# Patient Record
Sex: Male | Born: 1960
Health system: Southern US, Community
[De-identification: ages and names within clinical notes are randomized; demographics above are authoritative.]

## PROBLEM LIST (undated history)

## (undated) DIAGNOSIS — N289 Disorder of kidney and ureter, unspecified: Secondary | ICD-10-CM

## (undated) DIAGNOSIS — I509 Heart failure, unspecified: Secondary | ICD-10-CM

---
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
## 2018-02-01 DIAGNOSIS — I824Y2 Acute embolism and thrombosis of unspecified deep veins of left proximal lower extremity: Secondary | ICD-10-CM | POA: Insufficient documentation

## 2018-02-01 DIAGNOSIS — I10 Essential (primary) hypertension: Secondary | ICD-10-CM | POA: Insufficient documentation

## 2018-04-11 ENCOUNTER — Telehealth: Payer: Self-pay | Admitting: *Deleted

## 2018-04-11 ENCOUNTER — Other Ambulatory Visit (HOSPITAL_COMMUNITY): Payer: Self-pay | Admitting: Diagnostic Radiology

## 2018-04-11 DIAGNOSIS — Z95828 Presence of other vascular implants and grafts: Secondary | ICD-10-CM

## 2018-04-11 DIAGNOSIS — I82402 Acute embolism and thrombosis of unspecified deep veins of left lower extremity: Secondary | ICD-10-CM

## 2018-04-11 NOTE — Telephone Encounter (Signed)
Left message for patient to call back to fu with Thrombolysis with Dr. Ronni Rumble

## 2018-06-20 ENCOUNTER — Encounter: Payer: Self-pay | Admitting: *Deleted

## 2018-08-03 DIAGNOSIS — R609 Edema, unspecified: Secondary | ICD-10-CM

## 2018-08-03 DIAGNOSIS — Z86718 Personal history of other venous thrombosis and embolism: Secondary | ICD-10-CM

## 2018-08-03 DIAGNOSIS — I10 Essential (primary) hypertension: Secondary | ICD-10-CM

## 2018-08-03 DIAGNOSIS — J90 Pleural effusion, not elsewhere classified: Secondary | ICD-10-CM

## 2018-08-03 DIAGNOSIS — N179 Acute kidney failure, unspecified: Secondary | ICD-10-CM

## 2018-08-03 DIAGNOSIS — I509 Heart failure, unspecified: Secondary | ICD-10-CM

## 2018-10-11 ENCOUNTER — Telehealth (HOSPITAL_COMMUNITY): Payer: Self-pay | Admitting: Surgery

## 2018-10-11 NOTE — Telephone Encounter (Signed)
I called Kurt Woodard in reference to a message that I received from Dr Nyra Capes that he needs referral to the [facility].  I have requested his records and attempted to call him to arrange an appt in the clinic.  I asked that he return the call.

## 2018-11-15 ENCOUNTER — Telehealth (HOSPITAL_COMMUNITY): Payer: Self-pay | Admitting: Cardiology

## 2018-11-15 NOTE — Telephone Encounter (Signed)
Called and left message for patient to call back to schedule New CHF appt.  Trying to see if pt is available on 11/17/18 @ 9 am for appt with Dr. Aundra Dubin

## 2018-11-20 NOTE — Telephone Encounter (Signed)
Patient has not called back to schedule New CHF appt.  Called and left 2nd message for pt to call back to schedule appt.  Per Darnelle Bos, if no new CHF appts ava on MD side, can consider scheduling pt on APP side of Clinic.

## 2018-11-24 NOTE — Telephone Encounter (Signed)
Spoke with patient's son, Christy Sartorius, [facility] on acct.  He verified the home number we have for patient.  He stated would try to contact the patient and ask him to give [place] a call to schedule an appt.

## 2018-12-15 ENCOUNTER — Telehealth (HOSPITAL_COMMUNITY): Payer: Self-pay | Admitting: Vascular Surgery

## 2018-12-15 NOTE — Telephone Encounter (Signed)
Left pt message giving detailed message about New pt appt  02/02/19 @ 11:20 , asked pt to call back to confirm appt

## 2019-02-02 ENCOUNTER — Inpatient Hospital Stay (HOSPITAL_COMMUNITY): Admission: RE | Admit: 2019-02-02 | Payer: Self-pay | Source: Ambulatory Visit | Admitting: Internal Medicine

## 2019-06-20 ENCOUNTER — Emergency Department (HOSPITAL_COMMUNITY): Payer: Self-pay

## 2019-06-20 ENCOUNTER — Other Ambulatory Visit: Payer: Self-pay

## 2019-06-20 ENCOUNTER — Encounter (HOSPITAL_COMMUNITY): Payer: Self-pay

## 2019-06-20 ENCOUNTER — Emergency Department (HOSPITAL_COMMUNITY)
Admission: EM | Admit: 2019-06-20 | Discharge: 2019-06-21 | Disposition: A | Discharge status: Home / Self Care | Payer: Self-pay | Attending: Emergency Medicine | Admitting: Emergency Medicine

## 2019-06-20 DIAGNOSIS — K8050 Calculus of bile duct without cholangitis or cholecystitis without obstruction: Secondary | ICD-10-CM

## 2019-06-20 DIAGNOSIS — K8020 Calculus of gallbladder without cholecystitis without obstruction: Secondary | ICD-10-CM | POA: Insufficient documentation

## 2019-06-20 DIAGNOSIS — I509 Heart failure, unspecified: Secondary | ICD-10-CM | POA: Insufficient documentation

## 2019-06-20 DIAGNOSIS — Z79899 Other long term (current) drug therapy: Secondary | ICD-10-CM | POA: Insufficient documentation

## 2019-06-20 DIAGNOSIS — R140 Abdominal distension (gaseous): Secondary | ICD-10-CM | POA: Insufficient documentation

## 2019-06-20 HISTORY — DX: Heart failure, unspecified: I50.9

## 2019-06-20 HISTORY — DX: Disorder of kidney and ureter, unspecified: N28.9

## 2019-06-20 LAB — COMPREHENSIVE METABOLIC PANEL
ALT: 23 U/L (ref 0–44)
AST: 25 U/L (ref 15–41)
Albumin: 3.6 g/dL (ref 3.5–5.0)
Alkaline Phosphatase: 92 U/L (ref 38–126)
Anion gap: 14 (ref 5–15)
BUN: 29 mg/dL — ABNORMAL HIGH (ref 6–20)
CO2: 27 mmol/L (ref 22–32)
Calcium: 9.5 mg/dL (ref 8.9–10.3)
Chloride: 99 mmol/L (ref 98–111)
Creatinine, Ser: 1.81 mg/dL — ABNORMAL HIGH (ref 0.61–1.24)
GFR calc Af Amer: 47 mL/min — ABNORMAL LOW (ref >60)
GFR calc non Af Amer: 41 mL/min — ABNORMAL LOW (ref >60)
Glucose, Bld: 156 mg/dL — ABNORMAL HIGH (ref 70–99)
Potassium: 4.3 mmol/L (ref 3.5–5.1)
Sodium: 140 mmol/L (ref 135–145)
Total Bilirubin: 2.6 mg/dL — ABNORMAL HIGH (ref 0.3–1.2)
Total Protein: 7.4 g/dL (ref 6.5–8.1)

## 2019-06-20 LAB — URINALYSIS, ROUTINE W REFLEX MICROSCOPIC
Bilirubin Urine: NEGATIVE
Glucose, UA: NEGATIVE mg/dL
Ketones, ur: NEGATIVE mg/dL
Nitrite: NEGATIVE
Protein, ur: 100 mg/dL — AB
Specific Gravity, Urine: 1.015 (ref 1.005–1.030)
pH: 5 (ref 5.0–8.0)

## 2019-06-20 LAB — CBC
HCT: 50.8 % (ref 39.0–52.0)
Hemoglobin: 15.8 g/dL (ref 13.0–17.0)
MCH: 27.9 pg (ref 26.0–34.0)
MCHC: 31.1 g/dL (ref 30.0–36.0)
MCV: 89.6 fL (ref 80.0–100.0)
Platelets: 176 10*3/uL (ref 150–400)
RBC: 5.67 MIL/uL (ref 4.22–5.81)
RDW: 15.7 % — ABNORMAL HIGH (ref 11.5–15.5)
WBC: 8.8 10*3/uL (ref 4.0–10.5)
nRBC: 0 % (ref 0.0–0.2)

## 2019-06-20 LAB — LIPASE, BLOOD: Lipase: 25 U/L (ref 11–51)

## 2019-06-20 MED ORDER — SODIUM CHLORIDE 0.9% FLUSH: 3.0000 mL | Freq: Once | INTRAVENOUS | Status: DC

## 2019-06-20 NOTE — ED Provider Notes (Signed)
[facility] EMERGENCY DEPARTMENT Provider Note   CSN: [id number] Arrival date & time: 06/20/19  1515    History   Chief Complaint Chief Complaint  Patient presents with   Abdominal Pain    HPI Kurt Woodard is a 58 y.o. male.     The history is provided by the patient and medical records.  Abdominal Pain Pain location:  Generalized Pain quality: bloating   Pain radiates to:  Does not radiate Pain severity:  Mild Onset quality:  Sudden Duration:  2 weeks Timing:  Intermittent Progression:  Waxing and waning Chronicity:  New Context: eating   Context: not medication withdrawal, not recent illness, not retching, not suspicious food intake and not trauma   Worsened by:  Eating Associated symptoms: no anorexia, no belching, no chest pain, no chills, no constipation, no cough, no diarrhea, no dysuria, no fatigue, no fever, no nausea, no shortness of breath, no sore throat and no vomiting     Past Medical History:  Diagnosis Date   CHF (congestive heart failure) (HCC)    Renal disorder    kidney stones    There are no active problems to display for this patient.   History reviewed. No pertinent surgical history.      Home Medications    Prior to Admission medications   Medication Sig Start Date End Date Taking? Authorizing Provider  furosemide (LASIX) 40 MG tablet Take 40 mg by mouth 2 (two) times daily. 06/08/19  Yes [provider]    Family History No family history on file.  Social History Social History   Tobacco Use   Smoking status: Not on file  Substance Use Topics   Alcohol use: Not on file   Drug use: Not on file     Allergies   Patient has no known allergies.   Review of Systems Review of Systems  Constitutional: Negative for activity change, appetite change, chills, fatigue and fever.  HENT: Negative for sore throat.   Respiratory: Negative for cough and shortness of breath.   Cardiovascular:  Positive for leg swelling. Negative for chest pain.  Gastrointestinal: Positive for abdominal distention and abdominal pain. Negative for anorexia, blood in stool, constipation, diarrhea, nausea and vomiting.  Genitourinary: Negative for decreased urine volume and dysuria.  Musculoskeletal: Negative for joint swelling, neck pain and neck stiffness.  Skin: Negative for color change, pallor and wound.  Neurological: Negative.   Psychiatric/Behavioral: Negative.      Physical Exam Updated Vital Signs BP (!) 140/99    Pulse 100    Temp 98.2 F (36.8 C) (Oral)    Resp 14    Ht 5\' 10"  (1.778 m)    Wt 92.1 kg    SpO2 94%    BMI 29.13 kg/m   Physical Exam Vitals signs and nursing note reviewed.  Constitutional:      Appearance: He is well-developed.  HENT:     Head: Normocephalic and atraumatic.  Eyes:     Extraocular Movements: Extraocular movements intact.     Conjunctiva/sclera: Conjunctivae normal.     Pupils: Pupils are equal, round, and reactive to light.  Neck:     Musculoskeletal: Neck supple.  Cardiovascular:     Rate and Rhythm: Regular rhythm. Tachycardia present.     Heart sounds: No murmur.  Pulmonary:     Effort: Pulmonary effort is normal. No respiratory distress.     Breath sounds: Normal breath sounds.  Abdominal:     General: Abdomen is  protuberant. Bowel sounds are normal.     Palpations: Abdomen is soft. There is no shifting dullness or fluid wave.     Tenderness: There is abdominal tenderness in the right upper quadrant.  Skin:    General: Skin is warm and dry.     Capillary Refill: Capillary refill takes less than 2 seconds.  Neurological:     General: No focal deficit present.     Mental Status: He is alert.      ED Treatments / Results  Labs (all labs ordered are listed, but only abnormal results are displayed) Labs Reviewed  COMPREHENSIVE METABOLIC PANEL - Abnormal; Notable for the following components:      Result Value   Glucose, Bld 156 (*)      BUN 29 (*)    Creatinine, Ser 1.81 (*)    Total Bilirubin 2.6 (*)    GFR calc non Af Amer 41 (*)    GFR calc Af Amer 47 (*)    All other components within normal limits  CBC - Abnormal; Notable for the following components:   RDW 15.7 (*)    All other components within normal limits  URINALYSIS, ROUTINE W REFLEX MICROSCOPIC - Abnormal; Notable for the following components:   Hgb urine dipstick SMALL (*)    Protein, ur 100 (*)    Leukocytes,Ua TRACE (*)    Bacteria, UA RARE (*)    All other components within normal limits  LIPASE, BLOOD    EKG None  Radiology Dg Chest 2 View  Result Date: 06/20/2019 CLINICAL DATA:  Pain EXAM: CHEST - 2 VIEW COMPARISON:  CT from 08/03/2018 FINDINGS: Cardiac shadow is mildly enlarged. Left lung is clear. Right-sided pleural effusion with underlying atelectasis/infiltrate is noted. It appears slightly improved when compared with the prior exam. No new focal abnormality is seen. IMPRESSION: Right-sided pleural effusion with underlying infiltrate/atelectasis. This is mildly improved when compared with the prior exam. Electronically Signed   By: Inez Catalina M.D.   On: 06/20/2019 23:06   US Abdomen Limited  Result Date: 06/20/2019 CLINICAL DATA:  Right upper quadrant pain. Point of care ultrasound in the ED with gallbladder wall thickening. EXAM: ULTRASOUND ABDOMEN LIMITED RIGHT UPPER QUADRANT COMPARISON:  None. FINDINGS: Gallbladder: Physiologically distended. Multiple small layering gallstones. Mild wall thickening at 4-5 mm. No sonographic Murphy sign noted by sonographer. Common bile duct: Diameter: 3 mm. Liver: No focal lesion identified. Within normal limits in parenchymal echogenicity. Portal vein is patent on color Doppler imaging with normal direction of blood flow towards the liver. Right upper quadrant ascites.  Right pleural effusion. IMPRESSION: 1. Small gallstones with mild gallbladder wall thickening. Wall thickening is nonspecific and may be  secondary to presence of ascites. Negative sonographic Murphy sign. 2. No biliary dilatation. 3. Right upper quadrant ascites.  Right pleural effusion. Electronically Signed   By: Keith Rake M.D.   On: 06/20/2019 23:21    Procedures Procedures (including critical care time)  Medications Ordered in ED Medications - No data to display   Initial Impression / Assessment and Plan / ED Course  I have reviewed the triage vital signs and the nursing notes.  Pertinent labs & imaging results that were available during my care of the patient were reviewed by me and considered in my medical decision making (see chart for details).  Differentials considered: Urinary tract infection, cryptitis, cystitis,   EM Physician interpretation of Labs:   CBC unremarkable  Metabolic panel significant for hype glycemia with glucose  156 elevated BUN and creatinine without prior for comparison and hyperbilirubinemia bilirubin 2.6  Lipase  Proteinuria and trace leukocytes on urine   Medical Decision Making:  Kurt Woodard is a 58 y.o. male with past medical history significant for recently diagnosed heart failure currently on Lasix who presents to the ED for further evaluation of a several week history of postprandial abdominal cramping-like pain that he describes more as a bloating feeling.  He denied any fever and was afebrile on arrival to the ED.  He is hemodynamically stable.  He had mild epigastric and right upper quadrant tenderness.  Point-of-care ultrasound by this physician at the bedside revealed mildly dilated/borderline thickening of the anterior wall of the gallbladder as well as gallstones within the lower body of the gallbladder.  He was sent for formal ultrasound study and this revealed the same findings as above consistent with cholelithiasis without acute cholecystitis.  I discussed these findings with patient at the bedside, he will be given a referral for outpatient general surgery and  was advised to return to the emergency department as needed with the development of any fever, intractable vomiting, or new, worsening or concerning symptoms.  Additionally, patient has not yet established with cardiology regarding his apparent HF diagnosis. We will attempt to send a message to the cardiology group to help facilitate an ED follow up visit.    Final Clinical Impressions(s) / ED Diagnoses   Final diagnoses:  Postprandial abdominal bloating  Calculus of gallbladder without cholecystitis without obstruction  Biliary colic   Disposition: Discharge   Key discharge instructions: Strict return precautions provided. Patient was encouraged to return to the ED should they experience worsening or persistence of current symptoms, or should they develop new concerning symptoms. Encouraged them to f/u with their PCP on an outpatient basis. Questions regarding the diagnosis were answered, and side effects regarding therapies were provided in writing or orally. Patient discharged in stable condition.    ED Discharge Orders         Ordered    Ambulatory referral to General Surgery    Comments: Patient lives in [place]. Referral to General Surgeon in proximity to patient's home   06/21/19 0015         The plan for this patient was discussed with my attending physician, Dr. Merrily Pew, who voiced agreement and who oversaw evaluation and treatment of this patient.   Kirsty Monjaraz A. Jimmye Norman, MD Resident Physician, PGY-3 Emergency Medicine [facility]    Jefm Petty, MD 06/21/19 4481    Merrily Pew, MD 06/23/19 [phone]

## 2019-06-20 NOTE — ED Triage Notes (Signed)
Pt reports generalized abd pain and bloating for the past week. Recently started on lasix. Denies any changes in BM. No n.v. Pt a.o, nad noted.

## 2019-06-21 NOTE — ED Notes (Signed)
Patient verbalized understanding of dc instructions, vss, ambulatory with nad.   

## 2019-07-25 ENCOUNTER — Other Ambulatory Visit: Payer: Self-pay

## 2019-07-25 ENCOUNTER — Ambulatory Visit (HOSPITAL_COMMUNITY)
Admission: RE | Admit: 2019-07-25 | Discharge: 2019-07-25 | Disposition: A | Discharge status: Home / Self Care | Payer: Self-pay | Source: Ambulatory Visit | Attending: Internal Medicine | Admitting: Internal Medicine

## 2019-07-25 VITALS — BP 136/96 | HR 105 | Wt 183.6 lb

## 2019-07-25 DIAGNOSIS — I5023 Acute on chronic systolic (congestive) heart failure: Secondary | ICD-10-CM | POA: Insufficient documentation

## 2019-07-25 DIAGNOSIS — Z86718 Personal history of other venous thrombosis and embolism: Secondary | ICD-10-CM | POA: Insufficient documentation

## 2019-07-25 DIAGNOSIS — N183 Chronic kidney disease, stage 3 (moderate): Secondary | ICD-10-CM | POA: Insufficient documentation

## 2019-07-25 DIAGNOSIS — Z79899 Other long term (current) drug therapy: Secondary | ICD-10-CM | POA: Insufficient documentation

## 2019-07-25 DIAGNOSIS — R000 Tachycardia, unspecified: Secondary | ICD-10-CM | POA: Insufficient documentation

## 2019-07-25 DIAGNOSIS — I5022 Chronic systolic (congestive) heart failure: Secondary | ICD-10-CM

## 2019-07-25 DIAGNOSIS — I130 Hypertensive heart and chronic kidney disease with heart failure and stage 1 through stage 4 chronic kidney disease, or unspecified chronic kidney disease: Secondary | ICD-10-CM | POA: Insufficient documentation

## 2019-07-25 DIAGNOSIS — Z87442 Personal history of urinary calculi: Secondary | ICD-10-CM | POA: Insufficient documentation

## 2019-07-25 DIAGNOSIS — I10 Essential (primary) hypertension: Secondary | ICD-10-CM

## 2019-07-25 MED ORDER — FUROSEMIDE 40 MG PO TABS: 40.0000 mg | ORAL_TABLET | Freq: Two times a day (BID) | ORAL | 3 refills | Status: DC

## 2019-07-25 MED ORDER — DIGOXIN 125 MCG PO TABS: 125.0000 ug | ORAL_TABLET | Freq: Every day | ORAL | 11 refills | Status: DC

## 2019-07-25 MED ORDER — SACUBITRIL-VALSARTAN 49-51 MG PO TABS: 1.0000 | ORAL_TABLET | Freq: Two times a day (BID) | ORAL | Status: DC

## 2019-07-25 MED ORDER — ENTRESTO 49-51 MG PO TABS: 1.0000 | ORAL_TABLET | Freq: Two times a day (BID) | ORAL | 6 refills | Status: DC

## 2019-07-25 NOTE — Progress Notes (Signed)
ADVANCED HF CLINIC CONSULT NOTE  Referring Physician: Dr. Maryella Woodard Primary Care: Dr. Maryella Woodard Primary Cardiologist: New  HPI:  [name] is a 58 y/o male with a h/o HTN, CKD, DVT and CHF referred by Dr. Maryella Woodard for further evaluation of HF.   No records available for review.   Has a h/o HTN but was well until 2 years ago. In Nov 2018 developed acute SOB. Says he was in bed for 1 month. Went to Urgent Care and diagnosed with PNA. Subsequently found to have LE DVT. He then developed fluid overload and had echo in 2019 and was told heart was operating at 20%. Underwent diuresis and felt better.   Did not have cath or repeat u/s. Says he got scared and decided not to f/u.   Says he feels good now. Back to coaching baseball. No CP, SOB, edema, orthopnea. Not taking any BP meds. Was started on Entresto but lost his insurance and no longer taking. Denies snoring.   Labs 06/20/19  K 4.3  Cr 1.8  Hgb 15.8 Urine + protein   Review of Systems: [y] = yes, [ ]  = no   General: Weight gain [ ] ; Weight loss [ ] ; Anorexia [ ] ; Fatigue [ ] ; Fever [ ] ; Chills [ ] ; Weakness [ ]   Cardiac: Chest pain/pressure [ ] ; Resting SOB [ ] ; Exertional SOB [ ] ; Orthopnea [ ] ; Pedal Edema [ ] ; Palpitations [ ] ; Syncope [ ] ; Presyncope [ ] ; Paroxysmal nocturnal dyspnea[ ]   Pulmonary: Cough [ ] ; Wheezing[ ] ; Hemoptysis[ ] ; Sputum [ ] ; Snoring [ ]   GI: Vomiting[ ] ; Dysphagia[ ] ; Melena[ ] ; Hematochezia [ ] ; Heartburn[ ] ; Abdominal pain [ ] ; Constipation [ ] ; Diarrhea [ ] ; BRBPR [ ]   GU: Hematuria[ ] ; Dysuria [ ] ; Nocturia[ ]   Vascular: Pain in legs with walking [ ] ; Pain in feet with lying flat [ ] ; Non-healing sores [ ] ; Stroke [ ] ; TIA [ ] ; Slurred speech [ ] ;  Neuro: Headaches[ ] ; Vertigo[ ] ; Seizures[ ] ; Paresthesias[ ] ;Blurred vision [ ] ; Diplopia [ ] ; Vision changes [ ]   Ortho/Skin: Arthritis Blue.Reese ]; Joint pain [ y]; Muscle pain [ ] ; Joint swelling [ ] ; Back Pain [ ] ; Rash [ ]   Psych:  Depression[ ] ; Anxiety[ ]   Heme: Bleeding problems [ ] ; Clotting disorders [ ] ; Anemia [ ]   Endocrine: Diabetes [ ] ; Thyroid dysfunction[ ]    Past Medical History:  Diagnosis Date  . CHF (congestive heart failure) ([place])   . Renal disorder    kidney stones  - HTN - CKD 3  Current Outpatient Medications  Medication Sig Dispense Refill  . furosemide (LASIX) 40 MG tablet Take 40 mg by mouth 2 (two) times daily.     No current facility-administered medications for this encounter.     No Known Allergies    Social History   Socioeconomic History  . Marital status: Single    Spouse name: Not on file  . Number of children: Not on file  . Years of education: Not on file  . Highest education level: Not on file  Occupational History  . Not on file  Social Needs  . Financial resource strain: Not on file  . Food insecurity    Worry: Not on file    Inability: Not on file  . Transportation needs    Medical: Not on file    Non-medical: Not on file  Tobacco Use  . Smoking status: Not on file  Substance  and Sexual Activity  . Alcohol use: Not on file  . Drug use: Not on file  . Sexual activity: Not on file  Lifestyle  . Physical activity    Days per week: Not on file    Minutes per session: Not on file  . Stress: Not on file  Relationships  . Social Herbalist on phone: Not on file    Gets together: Not on file    Attends religious service: Not on file    Active member of club or organization: Not on file    Attends meetings of clubs or organizations: Not on file    Relationship status: Not on file  . Intimate partner violence    Fear of current or ex partner: Not on file    Emotionally abused: Not on file    Physically abused: Not on file    Forced sexual activity: Not on file  Other Topics Concern  . Not on file  Social History Narrative  . Not on file    FHx: Denies Fhx of HF or premature CAD Personally reviewed   Vitals:   07/25/19 1107  BP: (!)  136/96  Pulse: (!) 105  SpO2: 95%  Weight: 83.3 kg (183 lb 9.6 oz)    PHYSICAL EXAM: General:  Well appearing. No respiratory difficulty HEENT: normal Neck: supple. JVP 10 with prominent CV waves. Carotids 2+ bilat; no bruits. No lymphadenopathy or thryomegaly appreciated. Cor: PMI laterally displaced. Tachy regular +s3 Lungs: clear Abdomen: soft, nontender, nondistended. No hepatosplenomegaly. No bruits or masses. Good bowel sounds. Extremities: no cyanosis, clubbing, rash, 2+ edema + varicose veins White nails (no clubbing)  Neuro: alert & oriented x 3, cranial nerves grossly intact. moves all 4 extremities w/o difficulty. Affect pleasant.  ECG: Sinus tach 118 IVCD nonspecific T wave flattening. Personally reviewed   ASSESSMENT & PLAN: 1. Acute on chronic systolic HF  - Suspect HTN CM but also at risk for iCM - I did bedside echo in Clinic EF 20% markedly dilated. RV moderately down  - Reports NYHA II symptoms but is very tachycardic with severe LV dysfunction - Has mild LE edema but ReDS 35% - Will start Entresto 49/51 bid and digoxin 0.125 daily - Continue lasix 40 daily.  - Will have him see HF PharmD in 2 weeks and the me again in 4 weeks - Ideally would have R/L cath soon but he is concerned about lack of insurance. I had him meet with HF SW today to work on this.  - Call immediately if getting worse  2. HTN, uncontrolled - starting Entresto at above  3. H/o DVT - likely provoked in setting of immobility when he first had HF - currently off AC  4. 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 Over half that time spent discussing above.    Glori Bickers, MD  12:55 PM

## 2019-07-25 NOTE — Patient Instructions (Addendum)
Start Digoxin 0.125mg  ( 1 tablet ) Daily.  Start Entresto 49/51mg  ( 1 tablet ) twice a day.  Come back in 2 weeks to see the Pharmacist.  Your physician would like to see you in 1 month and he would like you to have an Echocardiogram the same day.  At the [facility], you and your health needs are our priority. As part of our continuing mission to provide you with exceptional heart care, we have created designated Provider Care Teams. These Care Teams include your primary Cardiologist (physician) and Advanced Practice Providers (APPs- Physician Assistants and Nurse Practitioners) who all work together to provide you with the care you need, when you need it.   You may see any of the following providers on your designated Care Team at your next follow up: Marland Kitchen Dr Glori Bickers . Dr Loralie Champagne . Darrick Grinder, NP   Please be sure to bring in all your medications bottles to every appointment.

## 2019-07-25 NOTE — Progress Notes (Signed)
CSW consulted to assist with pt medication cost and insurance concerns.  Pt is going to be started on Entresto which is very expensive and pt is currently uninsured.  CSW assisted pt in filling out patient portion of [employer] application and then gave to pharm tech/patient advocate to complete and turn in for review.   CSW and pt then talked about current lack of insurance.  Pt reports he has been unable to work since 2018 due to medical concerns but kept hoping that he would be able to return to work so he never applied for disability.  CSW discussed pt current medical status and he acknowledges that he likely go back to work like he used to for a long period of time and is agreeable to applying for disability.  CSW provided pt with number for local office and instructed him to call and set up telephone interview to begin application.  CSW also discussed Medicaid with patient.  Pt hasn't worked for 2 years and has not had a source of income- states he has no assets and has less than $10,000 in savings.  Pt states he made a good living when he was working so there is a chance he might make too much in disability benefits if he is approved to qualify for Medicaid. CSW provided pt with paper Medicaid application as patient does not have access to the internet at home.  CSW also provided pt with CAFA to apply for cone assistance- CSW helped pt fill out 4506 nonfiling form and submitted to IRS.  Pt to complete application and turn in for review when finished.  CSW will continue to follow and assist as needed  Kurt Woodard, [facility] Desk#: [phone] Cell#: [phone]

## 2019-07-26 ENCOUNTER — Telehealth (HOSPITAL_COMMUNITY): Payer: Self-pay | Admitting: Pharmacy Technician

## 2019-07-26 NOTE — Telephone Encounter (Signed)
Patient was in clinic and given an application for [employer] Patient [place] (NPAF) in an effort to reduce the patient's out of pocket expense for Entresto to $0.    Application completed and faxed to [phone].   NPAF phone number for follow up is [phone].   This encounter will be updated until final determination.   Charlann Boxer, CPhT

## 2019-08-01 NOTE — Telephone Encounter (Signed)
Received notification that patient was approved to receive Entresto 49-51mg  through [employer].  ID: [id number] Effective Dates: 07/27/2019 through 07/26/2020  Phone Number: [phone]  Called patient to inform him of approval and to give him [employer] phone number in case they have not reached the patient. Was not able to leave voicemail, as it is full.   Will follow up.  Charlann Boxer, CPhT

## 2019-08-03 ENCOUNTER — Telehealth (HOSPITAL_COMMUNITY): Payer: Self-pay | Admitting: Pharmacist

## 2019-08-03 NOTE — Telephone Encounter (Signed)
Have attempted to cotnact PT several times (8/20 & 8/21) and PT's voicemail is full.  PT's pharmacy appt on 8/24 needs to be rescheduled for the week of 8/31 or first available.  -GSM, [facility]

## 2019-08-06 ENCOUNTER — Inpatient Hospital Stay (HOSPITAL_COMMUNITY): Admission: RE | Admit: 2019-08-06 | Payer: Self-pay | Source: Ambulatory Visit

## 2019-08-06 NOTE — Telephone Encounter (Signed)
Called patient to inquire if he was able to speak with [employer] about setting up shipment for his Entresto. Was not successful in reaching patient. I was able to leave voicemail this time.  Charlann Boxer, CPhT

## 2019-08-07 NOTE — Telephone Encounter (Signed)
Called patient again, not able to leave message. Voicemail is full.  Charlann Boxer, CPhT

## 2019-08-09 ENCOUNTER — Telehealth (HOSPITAL_COMMUNITY): Payer: Self-pay | Admitting: Licensed Clinical Social Worker

## 2019-08-09 NOTE — Telephone Encounter (Signed)
CSW called pt to check in regarding progress with disability, medicaid, and CAFA applications.  CSW unable to leave VM but texted number to request call back.  CSW will continue to follow and assist as needed  Jorge Ny, [facility] Desk#: [phone] Cell#: [phone]

## 2019-08-16 ENCOUNTER — Inpatient Hospital Stay (HOSPITAL_COMMUNITY): Admission: RE | Admit: 2019-08-16 | Payer: Self-pay | Source: Ambulatory Visit

## 2019-08-17 NOTE — Telephone Encounter (Signed)
Lauren spoke with patient, he is aware he needs to call [employer].  Charlann Boxer, CPhT

## 2019-08-27 ENCOUNTER — Other Ambulatory Visit: Payer: Self-pay

## 2019-08-27 ENCOUNTER — Ambulatory Visit (HOSPITAL_COMMUNITY)
Admission: RE | Admit: 2019-08-27 | Discharge: 2019-08-27 | Disposition: A | Discharge status: Home / Self Care | Payer: Self-pay | Source: Ambulatory Visit | Attending: Internal Medicine | Admitting: Internal Medicine

## 2019-08-27 ENCOUNTER — Encounter (HOSPITAL_COMMUNITY): Payer: Self-pay

## 2019-08-27 VITALS — BP 138/98 | HR 107 | Wt 195.0 lb

## 2019-08-27 DIAGNOSIS — I509 Heart failure, unspecified: Secondary | ICD-10-CM

## 2019-08-27 DIAGNOSIS — Z79899 Other long term (current) drug therapy: Secondary | ICD-10-CM | POA: Insufficient documentation

## 2019-08-27 DIAGNOSIS — I5022 Chronic systolic (congestive) heart failure: Secondary | ICD-10-CM

## 2019-08-27 DIAGNOSIS — Z86718 Personal history of other venous thrombosis and embolism: Secondary | ICD-10-CM | POA: Insufficient documentation

## 2019-08-27 DIAGNOSIS — I130 Hypertensive heart and chronic kidney disease with heart failure and stage 1 through stage 4 chronic kidney disease, or unspecified chronic kidney disease: Secondary | ICD-10-CM | POA: Insufficient documentation

## 2019-08-27 DIAGNOSIS — N183 Chronic kidney disease, stage 3 (moderate): Secondary | ICD-10-CM | POA: Insufficient documentation

## 2019-08-27 DIAGNOSIS — I5023 Acute on chronic systolic (congestive) heart failure: Secondary | ICD-10-CM | POA: Insufficient documentation

## 2019-08-27 HISTORY — DX: Heart failure, unspecified: I50.9

## 2019-08-27 LAB — BASIC METABOLIC PANEL
Anion gap: 10 (ref 5–15)
BUN: 22 mg/dL — ABNORMAL HIGH (ref 6–20)
CO2: 21 mmol/L — ABNORMAL LOW (ref 22–32)
Calcium: 9.4 mg/dL (ref 8.9–10.3)
Chloride: 106 mmol/L (ref 98–111)
Creatinine, Ser: 1.18 mg/dL (ref 0.61–1.24)
GFR calc Af Amer: >60 mL/min (ref >60)
GFR calc non Af Amer: >60 mL/min (ref >60)
Glucose, Bld: 143 mg/dL — ABNORMAL HIGH (ref 70–99)
Potassium: 4.5 mmol/L (ref 3.5–5.1)
Sodium: 137 mmol/L (ref 135–145)

## 2019-08-27 LAB — DIGOXIN LEVEL: Digoxin Level: 0.3 ng/mL — ABNORMAL LOW (ref 0.8–2.0)

## 2019-08-27 MED ORDER — DIGOXIN 125 MCG PO TABS: 125.0000 ug | ORAL_TABLET | Freq: Every day | ORAL | 11 refills | Status: DC

## 2019-08-27 MED ORDER — FUROSEMIDE 40 MG PO TABS: 40.0000 mg | ORAL_TABLET | Freq: Two times a day (BID) | ORAL | 3 refills | Status: DC

## 2019-08-27 MED ORDER — CARVEDILOL 3.125 MG PO TABS: 3.1250 mg | ORAL_TABLET | Freq: Two times a day (BID) | ORAL | 6 refills | Status: DC

## 2019-08-27 MED FILL — FUROSEMIDE 40 MG TAB: 40 | 30 days supply | Qty: 60 | Fill #0

## 2019-08-27 MED FILL — CARVEDILOL 3.125 MG TABLET: 3.125 | 34 days supply | Qty: 68 | Fill #0

## 2019-08-27 MED FILL — DIGOXIN 0.125 MG TABLET: 125 | 34 days supply | Qty: 34 | Fill #0

## 2019-08-27 NOTE — Patient Instructions (Addendum)
It was a pleasure seeing you today!  MEDICATIONS: -We are changing your medications today -Start taking carvedilol 3.125 mg twice daily.  - [employer] so they can ship you the [facility]. -Call if you have questions about your medications.  LABS: -We will call you if your labs need attention.  NEXT APPOINTMENT: Return to clinic in 2 weeks with Pharmacy clinic.  In general, to take care of your heart failure: -Limit your fluid intake to 2 Liters (half-gallon) per day.   -Limit your salt intake to ideally 2-3 grams (2000-3000 mg) per day. -Weigh yourself daily and record, and bring that "weight diary" to your next appointment.  (Weight gain of 2-3 pounds in 1 day typically means fluid weight.) -The medications for your heart are to help your heart and help you live longer.   -Please contact us before stopping any of your heart medications.  Call the clinic at [phone] with questions or to reschedule future appointments.

## 2019-08-29 NOTE — Progress Notes (Signed)
Referring Physician: Dr. Maryella Shivers Primary Care: Dr. Maryella Shivers Primary Cardiologist: Dr. Glori Bickers  HPI:  Kurt Woodard is a 58 y/o male with a h/o HTN, CKD, DVT and CHF referred by Dr. Maryella Shivers for further evaluation of HF.    No records available for review.    Has a h/o HTN but was well until 2 years ago. In Nov 2018 developed acute SOB. Says he was in bed for 1 month. Went to Urgent Care and diagnosed with PNA. Subsequently found to have LE DVT. He then developed fluid overload and had echo in 2019 and was told heart was operating at 20%. Underwent diuresis and felt better.    Did not have cath or repeat u/s. Says he got scared and decided not to f/u.    Seen in HF clinic on 07/25/2019. Stated he feels good now, is back to coaching baseball. No CP, SOB, edema, orthopnea. Not taking any BP meds. Was started on Entresto 49/51 mg but lost his insurance and no longer taking. Denies snoring. Bedside echo showed EF 20% (07/25/2019). ReDS reading 35%.   Today he returns to HF clinic for pharmacist medication titration. At last visit with MD, Delene Loll 49/51 mg twice daily and digoxin 0.125 mg daily were initiated. He has tolerated those changes very well. No lightheadedness, dizziness, chest pain or palpitations. His exercise tolerance has greatly improved since starting Entresto. He is able to work outside on his yard and his neighbor's yard without stopping, whereas he used to only be able to work on half of his own yard. 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 He does not weigh himself daily. His weight is up today in clinic, but this is because he is eating more because he "feels so much better on the Entresto". He takes furosemide 40 mg twice daily and has not required any extra. No lower extremity edema, PND or orthopnea. He is very anxious about adding any new medications because he believes his father died from polypharmacy (he  stated a physician told him that medications were the cause of the cancer).     . Shortness of breath/dyspnea on exertion? no  . Orthopnea/PND? no . Edema? no . Lightheadedness/dizziness? no . Daily weights at home? no . Blood pressure/heart rate monitoring at home? no . Following low-sodium/fluid-restricted diet? yes  HF Medications: Entresto 49/51 mg twice daily Digoxin 0.125 mg daily  Furosemide 40 mg twice daily  Has the patient been experiencing any side effects to the medications prescribed?  no  Does the patient have any problems obtaining medications due to transportation or finances?   Yes - does not have prescription insurance. I sent his prescriptions to [facility] outpatient pharmacy today to be filled through the HF fund. He has been approved for [employer] for [place], but he needs to call [employer] to set up the shipment.   Understanding of regimen: fair Understanding of indications: poor Potential of compliance: poor Patient understands to avoid NSAIDs. Patient understands to avoid decongestants.    Pertinent Lab Values 08/27/19: . Serum creatinine 1.18, BUN 22, Potassium 4.5, Sodium 137, Digoxin 0.3 ng/mL   Vital Signs: . Weight: 195 (last clinic weight: 183.6) . Blood pressure: 138/98  . Heart rate: 107   Assessment: 1. Acute on chronic systolic HF  - Suspected HTN CM but also at risk for iCM - Bedside echo in Clinic on 07/25/2019 showed EF 20%, markedly dilated. RV moderately down.  - NYHA  II symptoms. Euvolemic on exam.  - BP stable at 138/98. Tachycardic HR 107.  - Labs reviewed in clinic were within normal limits: Scr improved from 1.81 to 1.18 and K stable at 4.5.  - Start carvedilol 3.125 mg BID. - Continue Entresto 49/51 mg BID  - Continue Digoxin 0.125 mg daily. Level today 0.3 ng/mL.  - Continue Furosemide 40 twice daily.  - He is extremely anxious about being on multiple medications at one time for his heart failure. I explained  indication and risk/benefit of all medications and gave him the Living with Heart Failure packet. He would benefit from keeping his medication regimen as simple as possible. Has self-discontinued medications in the past.  -Has no prescription insurance. Used HF fund for all medications except Entresto. Provided 1 month of samples for Entresto. Approved for Manufacturer[employer] Assistance but he was unaware he needed to call [employer] for delivery.  -Return to pharmacy clinic in 2 weeks. Echo in 4 weeks.  - Ideally would have R/L cath soon but he is concerned about lack of insurance. Meeting with social work about this.    2. HTN, uncontrolled - Start carvedilol 3.125 mg BID and continue Entresto 49/51 mg BID as above.   3. H/o DVT - Likely provoked in setting of immobility when he first had HF - Currently off AC   4. 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  2) Labs: Scr improving, K stable 3) Follow-up: 2 weeks with pharmacy clinic. 4 weeks with Dr. Haroldine Laws + echo.   Audry Riles, PharmD, BCPS Heart Failure Clinic Pharmacist [phone]   Patient seen, assessed and discussed with Dr. Lynelle Smoke.  Agree with assessment -  If patient seemed more tenuous and less active would have considered increasing Entresto for increased afterload reduction in setting of ST vs adding coreg, but states feels good and is actively coaching.  Agree with plan to initiate low dose coreg and increase Entresto in near future.  Bonnita Nasuti Pharm.D. CPP, BCPS Clinical Pharmacist [phone] 08/29/2019 1:04 PM

## 2019-09-12 ENCOUNTER — Inpatient Hospital Stay (HOSPITAL_COMMUNITY)
Admission: RE | Admit: 2019-09-12 | Discharge: 2019-09-12 | Disposition: A | Discharge status: Home / Self Care | Payer: Self-pay | Source: Ambulatory Visit

## 2019-09-26 ENCOUNTER — Ambulatory Visit (HOSPITAL_COMMUNITY): Admission: RE | Admit: 2019-09-26 | Payer: Self-pay | Source: Ambulatory Visit

## 2019-09-26 ENCOUNTER — Inpatient Hospital Stay (HOSPITAL_COMMUNITY): Admission: RE | Admit: 2019-09-26 | Payer: Self-pay | Source: Ambulatory Visit | Admitting: Internal Medicine

## 2020-01-21 MED FILL — DIGOXIN 0.125 MG TABLET: 125 | 34 days supply | Qty: 34 | Fill #1

## 2020-01-21 MED FILL — FUROSEMIDE 40 MG TAB: 40 | 30 days supply | Qty: 60 | Fill #1

## 2020-06-24 MED FILL — DIGOXIN 0.125 MG TABLET: 125 | 34 days supply | Qty: 34 | Fill #2

## 2020-06-24 MED FILL — FUROSEMIDE 40 MG TAB: 40 | 30 days supply | Qty: 60 | Fill #2

## 2020-07-18 ENCOUNTER — Telehealth (HOSPITAL_COMMUNITY): Payer: Self-pay | Admitting: Pharmacy Technician

## 2020-07-18 NOTE — Telephone Encounter (Signed)
It's time to re-enroll patient to receive medication assistance for Entresto from [employer]. Patient has no insurance at this time. Attempted to call patient, no way to leave a message.  Will follow up.

## 2020-07-21 ENCOUNTER — Other Ambulatory Visit (HOSPITAL_COMMUNITY): Payer: Self-pay

## 2020-07-21 MED ORDER — ENTRESTO 49-51 MG PO TABS: 1.0000 | ORAL_TABLET | Freq: Two times a day (BID) | ORAL | 0 refills | Status: DC

## 2020-08-14 NOTE — Telephone Encounter (Signed)
Attempted to call the patient again. No way to leave a message. Will be here to assist in the future as needed.  Charlann Boxer, CPhT

## 2020-09-26 ENCOUNTER — Other Ambulatory Visit (HOSPITAL_COMMUNITY): Payer: Self-pay | Admitting: Internal Medicine

## 2020-09-30 ENCOUNTER — Other Ambulatory Visit (HOSPITAL_COMMUNITY): Payer: Self-pay | Admitting: Internal Medicine

## 2020-09-30 ENCOUNTER — Other Ambulatory Visit (HOSPITAL_COMMUNITY): Payer: Self-pay

## 2020-09-30 MED ORDER — DIGOXIN 125 MCG PO TABS: 125.0000 ug | ORAL_TABLET | Freq: Every day | ORAL | 2 refills | Status: DC

## 2020-09-30 MED ORDER — CARVEDILOL 3.125 MG PO TABS: 3.1250 mg | ORAL_TABLET | Freq: Two times a day (BID) | ORAL | 3 refills | Status: DC

## 2020-09-30 MED ORDER — FUROSEMIDE 40 MG PO TABS: 40.0000 mg | ORAL_TABLET | Freq: Two times a day (BID) | ORAL | 2 refills | Status: DC

## 2020-09-30 MED FILL — FUROSEMIDE 40 MG TAB: 40 | 30 days supply | Qty: 60 | Fill #0

## 2020-09-30 MED FILL — DIGOXIN 0.125 MG TABLET: 125 | 34 days supply | Qty: 34 | Fill #0

## 2020-09-30 MED FILL — CARVEDILOL 3.125 MG TABLET: 3.125 | 30 days supply | Qty: 60 | Fill #0

## 2020-12-04 MED FILL — FUROSEMIDE 40 MG TAB: 40 | 30 days supply | Qty: 60 | Fill #1

## 2020-12-04 MED FILL — CARVEDILOL 3.125 MG TABLET: 3.125 | 30 days supply | Qty: 60 | Fill #1

## 2020-12-04 MED FILL — DIGOXIN 0.125 MG TABLET: 125 | 34 days supply | Qty: 34 | Fill #1

## 2021-02-05 MED FILL — DIGOXIN 0.125 MG TABLET: 125 | 34 days supply | Qty: 34 | Fill #2

## 2021-02-05 MED FILL — CARVEDILOL 3.125 MG TABLET: 3.125 | 30 days supply | Qty: 60 | Fill #2

## 2021-02-05 MED FILL — FUROSEMIDE 40 MG TAB: 40 | 30 days supply | Qty: 60 | Fill #2

## 2021-04-20 ENCOUNTER — Telehealth (HOSPITAL_COMMUNITY): Payer: Self-pay | Admitting: Licensed Clinical Social Worker

## 2021-04-20 NOTE — Telephone Encounter (Signed)
CSW received call from pt requesting help getting re-established with [employer] assistance.  Per notes his enrollment expired at the end of 2021 but Patient Advocate had been unable to get a hold of him to discuss re- enrollment.  Pt states he was out of town taking care of a family member but does have the [employer] re- enrollment form now that he is back home.  CSW informed him he needs to complete the patient assistance portion and then bring to the clinic.  See that pt has not been to clinic in about 1.5 years so told him he also needs to call and schedule an appt to make sure we still have him on the appropriate dose of Entresto- pt expressed understanding and will call to get appt scheduled.  Will continue to follow and assist as needed  Jorge Ny, [facility] Desk#: [phone] Cell#: [phone]

## 2021-04-23 ENCOUNTER — Other Ambulatory Visit (HOSPITAL_COMMUNITY): Payer: Self-pay

## 2021-04-23 ENCOUNTER — Other Ambulatory Visit (HOSPITAL_COMMUNITY): Payer: Self-pay | Admitting: Internal Medicine

## 2021-04-23 MED FILL — Carvedilol Tab 3.125 MG: ORAL | 30 days supply | Qty: 60 | Fill #0 | Status: CN

## 2021-04-24 ENCOUNTER — Other Ambulatory Visit (HOSPITAL_COMMUNITY): Payer: Self-pay

## 2021-04-24 ENCOUNTER — Other Ambulatory Visit (HOSPITAL_COMMUNITY): Payer: Self-pay | Admitting: Internal Medicine

## 2021-04-24 ENCOUNTER — Other Ambulatory Visit (HOSPITAL_COMMUNITY): Payer: Self-pay | Admitting: *Deleted

## 2021-04-24 DIAGNOSIS — I5022 Chronic systolic (congestive) heart failure: Secondary | ICD-10-CM

## 2021-04-24 MED ORDER — ENTRESTO 49-51 MG PO TABS: 1.0000 | ORAL_TABLET | Freq: Two times a day (BID) | ORAL | 3 refills | Status: DC

## 2021-04-24 MED ORDER — FUROSEMIDE 40 MG PO TABS
40.0000 mg | ORAL_TABLET | Freq: Two times a day (BID) | ORAL | 3 refills | Status: DC
  Filled 2021-04-24: qty 120, 60d supply, fill #0

## 2021-04-24 MED ORDER — DIGOXIN 125 MCG PO TABS
0.1250 mg | ORAL_TABLET | Freq: Every day | ORAL | 3 refills | Status: DC
  Filled 2021-04-24: qty 30, 30d supply, fill #0

## 2021-04-24 MED FILL — Carvedilol Tab 3.125 MG: ORAL | 30 days supply | Qty: 60 | Fill #0 | Status: CN

## 2021-04-30 ENCOUNTER — Encounter (HOSPITAL_COMMUNITY): Payer: Self-pay

## 2021-04-30 ENCOUNTER — Ambulatory Visit (HOSPITAL_COMMUNITY)
Admission: EM | Admit: 2021-04-30 | Discharge: 2021-04-30 | Disposition: A | Discharge status: Home / Self Care | Payer: Self-pay | Attending: Internal Medicine | Admitting: Internal Medicine

## 2021-04-30 ENCOUNTER — Other Ambulatory Visit: Payer: Self-pay

## 2021-04-30 DIAGNOSIS — R0989 Other specified symptoms and signs involving the circulatory and respiratory systems: Secondary | ICD-10-CM

## 2021-04-30 MED ORDER — GUAIFENESIN ER 600 MG PO TB12: 600.0000 mg | ORAL_TABLET | Freq: Two times a day (BID) | ORAL | 0 refills | Status: DC

## 2021-04-30 NOTE — ED Triage Notes (Signed)
Pt in with c/o chest congestion, nausea, and diarrhea. Pt states the diarrhea is improving  Pt has been taking otc meds with no relief

## 2021-04-30 NOTE — Discharge Instructions (Addendum)
Medications as prescribed Increase oral fluid intake If diarrhea does not subside completely please return to urgent care to be reevaluated.  At this time I suspect that the stomach "bug" is responsible for your diarrhea.  This is likely viral If you have any worsening symptoms please return to the urgent care to be reevaluated.

## 2021-05-02 NOTE — ED Provider Notes (Signed)
[facility]    CSN: [id number] Arrival date & time: 04/30/21  1005      History   Chief Complaint Chief Complaint  Patient presents with  . chest congestion  . Nausea    HPI Kurt Woodard is a 60 y.o. male comes to the urgent care with complaints of cough, nausea and diarrhea for few days duration.  The diarrhea is improving.  Patient has tried over-the-counter medications with no improvement in symptoms.  No recent travel.  Patient continues to have cough with gradual use.  If she is able to bring up some sputum is clear but very thick.  No shortness of breath or wheezing.  No fever or chills.  No sick contacts.Marland Kitchen   HPI  Past Medical History:  Diagnosis Date  . CHF (congestive heart failure) ([place])   . Chronic congestive heart failure ([place]) 08/27/2019  . Renal disorder    kidney stones    Patient Active Problem List   Diagnosis Date Noted  . Chronic congestive heart failure ([place]) 08/27/2019    History reviewed. No pertinent surgical history.     Home Medications    Prior to Admission medications   Medication Sig Start Date End Date Taking? Authorizing Provider  guaiFENesin (MUCINEX) 600 MG 12 hr tablet Take 1 tablet (600 mg total) by mouth 2 (two) times daily for 10 days. 04/30/21 05/10/21 Yes Christepher Melchior, Myrene Galas, MD  carvedilol (COREG) 3.125 MG tablet TAKE 1 TABLET (3.125 MG TOTAL) BY MOUTH 2 (TWO) TIMES DAILY. PLEASE CALL FOR OFFICE VISIT [phone] 09/30/20 09/30/21  Bensimhon, Shaune Pascal, MD  digoxin (LANOXIN) 0.125 MG tablet Take 1 tablet (0.125 mg total) by mouth daily. 04/24/21 04/24/22  Bensimhon, Shaune Pascal, MD  furosemide (LASIX) 40 MG tablet Take 1 tablet (40 mg total) by mouth 2 (two) times daily. 04/24/21   Bensimhon, Shaune Pascal, MD  sacubitril-valsartan (ENTRESTO) 49-51 MG Take 1 tablet by mouth 2 (two) times daily. Must keep further appointments for refills 04/24/21   Bensimhon, Shaune Pascal, MD    Family History History reviewed. No pertinent family  history.  Social History Social History   Tobacco Use  . Smoking status: Never Smoker  . Smokeless tobacco: Never Used     Allergies   Patient has no known allergies.   Review of Systems Review of Systems  HENT: Positive for congestion.   Respiratory: Positive for cough. Negative for choking, shortness of breath and wheezing.   Cardiovascular: Negative.   Gastrointestinal: Negative.   Genitourinary: Negative.      Physical Exam Triage Vital Signs ED Triage Vitals  Enc Vitals Group     BP 04/30/21 1114 (!) 142/101     Pulse Rate 04/30/21 1114 (!) 109     Resp 04/30/21 1114 19     Temp 04/30/21 1114 99 F (37.2 C)     Temp Source 04/30/21 1114 Oral     SpO2 04/30/21 1114 98 %     Weight --      Height --      Head Circumference --      Peak Flow --      Pain Score 04/30/21 1113 0     Pain Loc --      Pain Edu? --      Excl. in [place]? --    No data found.  Updated Vital Signs BP (!) 142/101   Pulse (!) 109   Temp 99 F (37.2 C) (Oral)   Resp 19  SpO2 98%   Visual Acuity Right Eye Distance:   Left Eye Distance:   Bilateral Distance:    Right Eye Near:   Left Eye Near:    Bilateral Near:     Physical Exam Vitals and nursing note reviewed.  Constitutional:      General: He is not in acute distress.    Appearance: He is not ill-appearing.  HENT:     Right Ear: Tympanic membrane normal.     Left Ear: Tympanic membrane normal.     Mouth/Throat:     Pharynx: No posterior oropharyngeal erythema.  Cardiovascular:     Rate and Rhythm: Normal rate and regular rhythm.     Pulses: Normal pulses.     Heart sounds: Normal heart sounds.  Pulmonary:     Effort: Pulmonary effort is normal.     Breath sounds: Normal breath sounds.  Abdominal:     General: Abdomen is flat. There is no distension.     Tenderness: There is no abdominal tenderness.  Neurological:     Mental Status: He is alert.      UC Treatments / Results  Labs (all labs ordered are  listed, but only abnormal results are displayed) Labs Reviewed - No data to display  EKG   Radiology No results found.  Procedures Procedures (including critical care time)  Medications Ordered in UC Medications - No data to display  Initial Impression / Assessment and Plan / UC Course  I have reviewed the triage vital signs and the nursing notes.  Pertinent labs & imaging results that were available during my care of the patient were reviewed by me and considered in my medical decision making (see chart for details).     1.  Chest congestion: Mucinex twice daily as needed Physical exam is reassuring Increase p.o. fluids intake Returns to urgent care symptoms worsen I suspect that this part of viral gastroenteritis.   Final Clinical Impressions(s) / UC Diagnoses   Final diagnoses:  Chest congestion     Discharge Instructions     Medications as prescribed Increase oral fluid intake If diarrhea does not subside completely please return to urgent care to be reevaluated.  At this time I suspect that the stomach "bug" is responsible for your diarrhea.  This is likely viral If you have any worsening symptoms please return to the urgent care to be reevaluated.    ED Prescriptions    Medication Sig Dispense Auth. Provider   guaiFENesin (MUCINEX) 600 MG 12 hr tablet Take 1 tablet (600 mg total) by mouth 2 (two) times daily for 10 days. 20 tablet Shakia Sebastiano, Myrene Galas, MD     PDMP not reviewed this encounter.   Chase Picket, MD 05/02/21 [phone]

## 2021-05-06 ENCOUNTER — Emergency Department (HOSPITAL_COMMUNITY): Payer: Self-pay

## 2021-05-06 ENCOUNTER — Encounter (HOSPITAL_COMMUNITY): Payer: Self-pay

## 2021-05-06 ENCOUNTER — Emergency Department (HOSPITAL_BASED_OUTPATIENT_CLINIC_OR_DEPARTMENT_OTHER): Payer: Self-pay

## 2021-05-06 ENCOUNTER — Inpatient Hospital Stay (HOSPITAL_COMMUNITY)
Admission: EM | Admit: 2021-05-06 | Discharge: 2021-05-09 | DRG: 286 | Disposition: A | Discharge status: Home / Self Care | Payer: Self-pay | Attending: Family Medicine | Admitting: Family Medicine

## 2021-05-06 ENCOUNTER — Other Ambulatory Visit: Payer: Self-pay

## 2021-05-06 DIAGNOSIS — Z79899 Other long term (current) drug therapy: Secondary | ICD-10-CM

## 2021-05-06 DIAGNOSIS — N179 Acute kidney failure, unspecified: Secondary | ICD-10-CM | POA: Diagnosis present

## 2021-05-06 DIAGNOSIS — E1165 Type 2 diabetes mellitus with hyperglycemia: Secondary | ICD-10-CM | POA: Diagnosis present

## 2021-05-06 DIAGNOSIS — D179 Benign lipomatous neoplasm, unspecified: Secondary | ICD-10-CM | POA: Diagnosis present

## 2021-05-06 DIAGNOSIS — I2699 Other pulmonary embolism without acute cor pulmonale: Secondary | ICD-10-CM | POA: Diagnosis present

## 2021-05-06 DIAGNOSIS — I2693 Single subsegmental pulmonary embolism without acute cor pulmonale: Secondary | ICD-10-CM | POA: Diagnosis present

## 2021-05-06 DIAGNOSIS — R778 Other specified abnormalities of plasma proteins: Secondary | ICD-10-CM

## 2021-05-06 DIAGNOSIS — Z20822 Contact with and (suspected) exposure to covid-19: Secondary | ICD-10-CM | POA: Diagnosis present

## 2021-05-06 DIAGNOSIS — N1832 Chronic kidney disease, stage 3b: Secondary | ICD-10-CM | POA: Diagnosis present

## 2021-05-06 DIAGNOSIS — Z9889 Other specified postprocedural states: Secondary | ICD-10-CM

## 2021-05-06 DIAGNOSIS — E1122 Type 2 diabetes mellitus with diabetic chronic kidney disease: Secondary | ICD-10-CM | POA: Diagnosis present

## 2021-05-06 DIAGNOSIS — I130 Hypertensive heart and chronic kidney disease with heart failure and stage 1 through stage 4 chronic kidney disease, or unspecified chronic kidney disease: Principal | ICD-10-CM | POA: Diagnosis present

## 2021-05-06 DIAGNOSIS — R609 Edema, unspecified: Secondary | ICD-10-CM

## 2021-05-06 DIAGNOSIS — E876 Hypokalemia: Secondary | ICD-10-CM | POA: Diagnosis present

## 2021-05-06 DIAGNOSIS — I5023 Acute on chronic systolic (congestive) heart failure: Secondary | ICD-10-CM | POA: Diagnosis present

## 2021-05-06 DIAGNOSIS — I428 Other cardiomyopathies: Secondary | ICD-10-CM | POA: Diagnosis present

## 2021-05-06 DIAGNOSIS — I081 Rheumatic disorders of both mitral and tricuspid valves: Secondary | ICD-10-CM | POA: Diagnosis present

## 2021-05-06 DIAGNOSIS — I248 Other forms of acute ischemic heart disease: Secondary | ICD-10-CM | POA: Diagnosis present

## 2021-05-06 LAB — CBC
HCT: 48.6 % (ref 39.0–52.0)
Hemoglobin: 15.2 g/dL (ref 13.0–17.0)
MCH: 28.4 pg (ref 26.0–34.0)
MCHC: 31.3 g/dL (ref 30.0–36.0)
MCV: 90.7 fL (ref 80.0–100.0)
Platelets: 197 10*3/uL (ref 150–400)
RBC: 5.36 MIL/uL (ref 4.22–5.81)
RDW: 14.2 % (ref 11.5–15.5)
WBC: 10 10*3/uL (ref 4.0–10.5)
nRBC: 0 % (ref 0.0–0.2)

## 2021-05-06 LAB — HEPATIC FUNCTION PANEL
ALT: 16 U/L (ref 0–44)
AST: 16 U/L (ref 15–41)
Albumin: 3.5 g/dL (ref 3.5–5.0)
Alkaline Phosphatase: 80 U/L (ref 38–126)
Bilirubin, Direct: 0.6 mg/dL — ABNORMAL HIGH (ref 0.0–0.2)
Indirect Bilirubin: 2.1 mg/dL — ABNORMAL HIGH (ref 0.3–0.9)
Total Bilirubin: 2.7 mg/dL — ABNORMAL HIGH (ref 0.3–1.2)
Total Protein: 6.7 g/dL (ref 6.5–8.1)

## 2021-05-06 LAB — LIPID PANEL
Cholesterol: 144 mg/dL (ref 0–200)
HDL: 27 mg/dL — ABNORMAL LOW (ref >40)
LDL Cholesterol: 97 mg/dL (ref 0–99)
Total CHOL/HDL Ratio: 5.3 RATIO
Triglycerides: 102 mg/dL (ref <150)
VLDL: 20 mg/dL (ref 0–40)

## 2021-05-06 LAB — BASIC METABOLIC PANEL
Anion gap: 12 (ref 5–15)
BUN: 18 mg/dL (ref 6–20)
CO2: 26 mmol/L (ref 22–32)
Calcium: 8.6 mg/dL — ABNORMAL LOW (ref 8.9–10.3)
Chloride: 97 mmol/L — ABNORMAL LOW (ref 98–111)
Creatinine, Ser: 1.83 mg/dL — ABNORMAL HIGH (ref 0.61–1.24)
GFR, Estimated: 42 mL/min — ABNORMAL LOW (ref >60)
Glucose, Bld: 327 mg/dL — ABNORMAL HIGH (ref 70–99)
Potassium: 3 mmol/L — ABNORMAL LOW (ref 3.5–5.1)
Sodium: 135 mmol/L (ref 135–145)

## 2021-05-06 LAB — GLUCOSE, CAPILLARY: Glucose-Capillary: 313 mg/dL — ABNORMAL HIGH (ref 70–99)

## 2021-05-06 LAB — TROPONIN I (HIGH SENSITIVITY)
Troponin I (High Sensitivity): 59 ng/L — ABNORMAL HIGH (ref <18)
Troponin I (High Sensitivity): 48 ng/L — ABNORMAL HIGH (ref <18)

## 2021-05-06 LAB — MAGNESIUM: Magnesium: 1.5 mg/dL — ABNORMAL LOW (ref 1.7–2.4)

## 2021-05-06 LAB — RESP PANEL BY RT-PCR (FLU A&B, COVID) ARPGX2
Influenza A by PCR: NEGATIVE
Influenza B by PCR: NEGATIVE
SARS Coronavirus 2 by RT PCR: NEGATIVE

## 2021-05-06 LAB — LIPASE, BLOOD: Lipase: 26 U/L (ref 11–51)

## 2021-05-06 LAB — BRAIN NATRIURETIC PEPTIDE: B Natriuretic Peptide: 961.5 pg/mL — ABNORMAL HIGH (ref 0.0–100.0)

## 2021-05-06 MED ORDER — APIXABAN 5 MG PO TABS: 5.0000 mg | ORAL_TABLET | Freq: Two times a day (BID) | ORAL | Status: DC

## 2021-05-06 MED ORDER — SPIRONOLACTONE 12.5 MG HALF TABLET
12.5000 mg | ORAL_TABLET | Freq: Every day | ORAL | Status: DC
  Administered 2021-05-06 – 2021-05-07 (×2): 12.5 mg via ORAL
  Filled 2021-05-06 (×2): qty 1

## 2021-05-06 MED ORDER — POLYETHYLENE GLYCOL 3350 17 G PO PACK: 17.0000 g | PACK | Freq: Every day | ORAL | Status: DC | PRN

## 2021-05-06 MED ORDER — DIGOXIN 125 MCG PO TABS
0.1250 mg | ORAL_TABLET | Freq: Every day | ORAL | Status: DC
  Administered 2021-05-06 – 2021-05-09 (×4): 0.125 mg via ORAL
  Filled 2021-05-06 (×4): qty 1

## 2021-05-06 MED ORDER — POTASSIUM CHLORIDE 20 MEQ PO PACK
40.0000 meq | PACK | ORAL | Status: AC
Start: 2021-05-06 — End: 2021-05-07
  Administered 2021-05-06 – 2021-05-07 (×3): 40 meq via ORAL
  Filled 2021-05-06 (×3): qty 2

## 2021-05-06 MED ORDER — FUROSEMIDE 10 MG/ML IJ SOLN
20.0000 mg | Freq: Once | INTRAMUSCULAR | Status: AC
  Administered 2021-05-06: 20 mg via INTRAVENOUS
  Filled 2021-05-06: qty 2

## 2021-05-06 MED ORDER — FUROSEMIDE 10 MG/ML IJ SOLN
80.0000 mg | Freq: Two times a day (BID) | INTRAMUSCULAR | Status: DC
  Administered 2021-05-07: 80 mg via INTRAVENOUS
  Filled 2021-05-06: qty 8

## 2021-05-06 MED ORDER — HEPARIN (PORCINE) 25000 UT/250ML-% IV SOLN
2050.0000 [IU]/h | INTRAVENOUS | Status: DC
  Administered 2021-05-06: 1500 [IU]/h via INTRAVENOUS
  Administered 2021-05-07 (×2): 1900 [IU]/h via INTRAVENOUS
  Filled 2021-05-06 (×3): qty 250

## 2021-05-06 MED ORDER — ACETAMINOPHEN 325 MG PO TABS: 650.0000 mg | ORAL_TABLET | Freq: Four times a day (QID) | ORAL | Status: DC | PRN

## 2021-05-06 MED ORDER — APIXABAN 5 MG PO TABS: 10.0000 mg | ORAL_TABLET | Freq: Two times a day (BID) | ORAL | Status: DC

## 2021-05-06 MED ORDER — HEPARIN BOLUS VIA INFUSION
5000.0000 [IU] | Freq: Once | INTRAVENOUS | Status: DC
  Filled 2021-05-06: qty 5000

## 2021-05-06 MED ORDER — HEPARIN (PORCINE) 25000 UT/250ML-% IV SOLN
1500.0000 [IU]/h | INTRAVENOUS | Status: DC
  Filled 2021-05-06: qty 250

## 2021-05-06 MED ORDER — SACUBITRIL-VALSARTAN 49-51 MG PO TABS
1.0000 | ORAL_TABLET | Freq: Two times a day (BID) | ORAL | Status: DC
  Administered 2021-05-06 – 2021-05-09 (×6): 1 via ORAL
  Filled 2021-05-06 (×6): qty 1

## 2021-05-06 MED ORDER — INSULIN ASPART 100 UNIT/ML IJ SOLN
0.0000 [IU] | Freq: Three times a day (TID) | INTRAMUSCULAR | Status: DC
  Administered 2021-05-07 (×2): 5 [IU] via SUBCUTANEOUS
  Administered 2021-05-07: 3 [IU] via SUBCUTANEOUS

## 2021-05-06 MED ORDER — HEPARIN BOLUS VIA INFUSION
5000.0000 [IU] | Freq: Once | INTRAVENOUS | Status: AC
  Administered 2021-05-06: 5000 [IU] via INTRAVENOUS
  Filled 2021-05-06: qty 5000

## 2021-05-06 MED ORDER — ACETAMINOPHEN 650 MG RE SUPP: 650.0000 mg | Freq: Four times a day (QID) | RECTAL | Status: DC | PRN

## 2021-05-06 MED ORDER — CARVEDILOL 3.125 MG PO TABS
3.1250 mg | ORAL_TABLET | Freq: Two times a day (BID) | ORAL | Status: DC
  Administered 2021-05-06: 3.125 mg via ORAL
  Filled 2021-05-06: qty 1

## 2021-05-06 MED ORDER — IOHEXOL 350 MG/ML SOLN
75.0000 mL | Freq: Once | INTRAVENOUS | Status: AC | PRN
  Administered 2021-05-06: 75 mL via INTRAVENOUS

## 2021-05-06 NOTE — Progress Notes (Signed)
BLE venous duplex has been completed.  Preliminary results given to Providence Lanius, PA.  Results can be found under chart review under CV PROC. 05/06/2021 2:13 PM Farzana Koci RVT, RDMS

## 2021-05-06 NOTE — ED Triage Notes (Addendum)
Patient complains of ongoing chest wall pain with inspiration, movement and cough. Nonsmoker. Seen last week for same. No fever. No chills. Patient also out of daily meds and no appointment until August. Entresto, diuretic

## 2021-05-06 NOTE — Consult Note (Addendum)
Advanced Heart Failure Team Consult Note   Primary Physician: Patient, No Pcp Per (Inactive) PCP-Cardiologist:  None  Reason for Consultation: Acute on chronic systolic HF  HPI:    Kurt Woodard is seen today for evaluation of acute on chronic systolic HF at the request of Dr. Caron Presume  Kurt Woodard is a 60 y/o male with a h/o HTN, CKD 3b (Scr 1.8), DVT and systolic CHF.   Found to have DVT in 2018. He then developed fluid overload and had echo at OSH in 2019 and was told heart was operating at 20%. Underwent diuresis and felt better.  Did not have cath or repeat u/s. Says he got scared and decided not to f/u.   I saw him in 8/20 in [facility] for one visit.  I did bedside echo and EF 20% and RV moderately HK. Reported only NYHA II symptoms but was markedly tachycardic. Advised on need for R/L cath but he never followed up. DVT felt to be likely provoked in setting of immobility when he first had HF  Says he has not been taking his Entresto for several weeks but was taking his fluid pill. Recently had "stomach bug" with diarrhea. Presented to ER today with left lower chest pain. Denies significant SOB. + LE edema.  Hstrop 59 -> 48  BNP 961  Had CT of chest with subsegmental LL PE, Lrge R pleural effusion and evidence of HF with contrast reflux into hepatic veins.     Review of Systems: [y] = yes, [ ]  = no   . General: Weight gain [ ] ; Weight loss [ ] ; Anorexia [ ] ; Fatigue [ ] ; Fever [ ] ; Chills [ ] ; Weakness [ ]   . Cardiac: Chest pain/pressure Blue.Reese ]; Resting SOB [ ] ; Exertional SOB [ ] ; Orthopnea [ ] ; Pedal Edema [ y]; Palpitations [ ] ; Syncope [ ] ; Presyncope [ ] ; Paroxysmal nocturnal dyspnea[ ]   . Pulmonary: Cough [ ] ; Wheezing[ ] ; Hemoptysis[ ] ; Sputum [ ] ; Snoring [ ]   . GI: Vomiting[ ] ; Dysphagia[ ] ; Melena[ ] ; Hematochezia [ ] ; Heartburn[ ] ; Abdominal pain [ ] ; Constipation [ ] ; Diarrhea Blue.Reese ]; BRBPR [ ]   . GU: Hematuria[ ] ; Dysuria [ ] ; Nocturia[ ]   . Vascular: Pain in  legs with walking [ ] ; Pain in feet with lying flat [ ] ; Non-healing sores [ ] ; Stroke [ ] ; TIA [ ] ; Slurred speech [ ] ;  . Neuro: Headaches[ ] ; Vertigo[ ] ; Seizures[ ] ; Paresthesias[ ] ;Blurred vision [ ] ; Diplopia [ ] ; Vision changes [ ]   . Ortho/Skin: Arthritis [ y]; Joint pain Blue.Reese ]; Muscle pain [ ] ; Joint swelling [ ] ; Back Pain [ ] ; Rash [ ]   . Psych: Depression[ ] ; Anxiety[ ]   . Heme: Bleeding problems [ ] ; Clotting disorders [ ] ; Anemia [ ]   . Endocrine: Diabetes [ ] ; Thyroid dysfunction[ ]   Home Medications Prior to Admission medications   Medication Sig Start Date End Date Taking? Authorizing Provider  carvedilol (COREG) 3.125 MG tablet TAKE 1 TABLET (3.125 MG TOTAL) BY MOUTH 2 (TWO) TIMES DAILY. PLEASE CALL FOR OFFICE VISIT [phone] 09/30/20 09/30/21 Yes [place] Spainhower, Shaune Pascal, MD  digoxin (LANOXIN) 0.125 MG tablet Take 1 tablet (0.125 mg total) by mouth daily. 04/24/21 04/24/22 Yes Darbi Chandran, Shaune Pascal, MD  furosemide (LASIX) 40 MG tablet Take 1 tablet (40 mg total) by mouth 2 (two) times daily. 04/24/21  Yes Denesia Donelan, Shaune Pascal, MD  guaiFENesin (MUCINEX) 600 MG 12 hr tablet Take 1 tablet (600 mg  total) by mouth 2 (two) times daily for 10 days. 04/30/21 05/10/21 Yes Lamptey, Myrene Galas, MD  sacubitril-valsartan (ENTRESTO) 49-51 MG Take 1 tablet by mouth 2 (two) times daily. Must keep further appointments for refills 04/24/21  Yes Tahra Hitzeman, Shaune Pascal, MD    Past Medical History: Past Medical History:  Diagnosis Date  . CHF (congestive heart failure) ([place])   . Chronic congestive heart failure ([place]) 08/27/2019  . Renal disorder    kidney stones    Past Surgical History: History reviewed. No pertinent surgical history.  Family History: No family history on file.  Social History: Social History   Socioeconomic History  . Marital status: Single    Spouse name: Not on file  . Number of children: Not on file  . Years of education: Not on file  . Highest education level: Not on file   Occupational History  . Not on file  Tobacco Use  . Smoking status: Never Smoker  . Smokeless tobacco: Never Used  Substance and Sexual Activity  . Alcohol use: Not on file  . Drug use: Not on file  . Sexual activity: Not on file  Other Topics Concern  . Not on file  Social History Narrative  . Not on file   Social Determinants of Health   Financial Resource Strain: Not on file  Food Insecurity: Not on file  Transportation Needs: Not on file  Physical Activity: Not on file  Stress: Not on file  Social Connections: Not on file    Allergies:  No Known Allergies  Objective:    Vital Signs:   Temp:  [97.8 F (36.6 C)] 97.8 F (36.6 C) (05/25 1113) Pulse Rate:  [105-117] 109 (05/25 1715) Resp:  [18-31] 25 (05/25 1715) BP: (135-152)/(96-123) 142/108 (05/25 1715) SpO2:  [93 %-100 %] 97 % (05/25 1715)    Weight change: There were no vitals filed for this visit.  Intake/Output:  No intake or output data in the 24 hours ending 05/06/21 1842    Physical Exam    General:  Well appearing. No resp difficulty on lying down  HEENT: normal Neck: supple. JVP to jaw  . Carotids 2+ bilat; no bruits. No lymphadenopathy or thyromegaly appreciated. Cor: PMI nondisplaced. Regular tachy No rubs, gallops or murmurs. Lungs: dull on R 2/3 up  Abdomen: soft, nontender, nondistended. No hepatosplenomegaly. No bruits or masses. Good bowel sounds. Extremities: no cyanosis, clubbing, rash, 2+ edema L> R Neuro: alert & orientedx3, cranial nerves grossly intact. moves all 4 extremities w/o difficulty. Affect pleasant   Telemetry   Sinus 110-115 Personally reviewed   EKG    Sinus tach 112. Non-specific T wave abnormality Personally reviewed   Labs   Basic Metabolic Panel: Recent Labs  Lab 05/06/21 1127  NA 135  K 3.0*  CL 97*  CO2 26  GLUCOSE 327*  BUN 18  CREATININE 1.83*  CALCIUM 8.6*    Liver Function Tests: Recent Labs  Lab 05/06/21 1127  AST 16  ALT 16   ALKPHOS 80  BILITOT 2.7*  PROT 6.7  ALBUMIN 3.5   Recent Labs  Lab 05/06/21 1127  LIPASE 26   No results for input(s): AMMONIA in the last 168 hours.  CBC: Recent Labs  Lab 05/06/21 1127  WBC 10.0  HGB 15.2  HCT 48.6  MCV 90.7  PLT 197    Cardiac Enzymes: No results for input(s): CKTOTAL, CKMB, CKMBINDEX, TROPONINI in the last 168 hours.  BNP: BNP (last 3 results) Recent Labs  05/06/21 1127  BNP 961.5*    ProBNP (last 3 results) No results for input(s): PROBNP in the last 8760 hours.   CBG: No results for input(s): GLUCAP in the last 168 hours.  Coagulation Studies: No results for input(s): LABPROT, INR in the last 72 hours.   Imaging   DG Chest 2 View  Result Date: 05/06/2021 CLINICAL DATA:  Chest pain EXAM: CHEST - 2 VIEW COMPARISON:  06/20/2019 FINDINGS: Persistent or recurrent right pleural effusion with adjacent atelectasis. Probable mild pulmonary edema. No pneumothorax. Similar cardiomegaly. IMPRESSION: Persistent or recurrent moderate right pleural effusion with adjacent atelectasis. Cardiomegaly with probable mild pulmonary edema. Electronically Signed   By: Macy Mis M.D.   On: 05/06/2021 12:21   VAS [place] LOWER EXTREMITY VENOUS (DVT) (ONLY [facility] & [facility] 7a-7p)  Result Date: 05/06/2021  Lower Venous DVT Study Patient Name:  AXIEL FJELD  Date of Exam:   05/06/2021 Medical Rec #: [id number]      Accession #:    [id number] Date of Birth: [birth year]     Patient Gender: M Patient Age:   59Y Exam Location:  [facility] Procedure:      VAS [place] LOWER EXTREMITY VENOUS (DVT) Referring Phys: [id number] [place] --------------------------------------------------------------------------------  Indications: Edema.  Comparison Study: Exam at [facility] 02/01/18 - extensive DVT to LLE                   from"iliac to ankle" per report Performing Technologist: Rogelia Rohrer  Examination Guidelines: A complete evaluation includes B-mode imaging, spectral  Doppler, color Doppler, and power Doppler as needed of all accessible portions of each vessel. Bilateral testing is considered an integral part of a complete examination. Limited examinations for reoccurring indications may be performed as noted. The reflux portion of the exam is performed with the patient in reverse Trendelenburg.  +---------+---------------+---------+-----------+----------+--------------+ RIGHT    CompressibilityPhasicitySpontaneityPropertiesThrombus Aging +---------+---------------+---------+-----------+----------+--------------+ CFV      Full           Yes      Yes                                 +---------+---------------+---------+-----------+----------+--------------+ SFJ      Full                                                        +---------+---------------+---------+-----------+----------+--------------+ FV Prox  Full           Yes      Yes                                 +---------+---------------+---------+-----------+----------+--------------+ FV Mid   Full           Yes      Yes                                 +---------+---------------+---------+-----------+----------+--------------+ FV DistalFull           Yes      Yes                                 +---------+---------------+---------+-----------+----------+--------------+  PFV      Full                                                        +---------+---------------+---------+-----------+----------+--------------+ POP      Full           Yes      Yes                                 +---------+---------------+---------+-----------+----------+--------------+ PTV      Full                                                        +---------+---------------+---------+-----------+----------+--------------+ PERO     Full                                                        +---------+---------------+---------+-----------+----------+--------------+    +---------+---------------+---------+-----------+----------+--------------+ LEFT     CompressibilityPhasicitySpontaneityPropertiesThrombus Aging +---------+---------------+---------+-----------+----------+--------------+ CFV      Full           Yes      Yes                                 +---------+---------------+---------+-----------+----------+--------------+ SFJ      Full                                                        +---------+---------------+---------+-----------+----------+--------------+ FV Prox  Full           Yes      Yes                                 +---------+---------------+---------+-----------+----------+--------------+ FV Mid   Full           Yes      Yes                                 +---------+---------------+---------+-----------+----------+--------------+ FV DistalFull           Yes      Yes                                 +---------+---------------+---------+-----------+----------+--------------+ PFV      Full                                                        +---------+---------------+---------+-----------+----------+--------------+  POP      Full           Yes      Yes                                 +---------+---------------+---------+-----------+----------+--------------+ PTV      Full                                                        +---------+---------------+---------+-----------+----------+--------------+ PERO     Full                                                        +---------+---------------+---------+-----------+----------+--------------+    Summary: BILATERAL: - No evidence of deep vein thrombosis seen in the lower extremities, bilaterally. - No evidence of superficial venous thrombosis in the lower extremities, bilaterally. -No evidence of popliteal cyst, bilaterally. -Subcutaneous edema in BLE from knee to ankle.  *See table(s) above for measurements and observations.    Preliminary        Medications:     Current Medications: . apixaban  10 mg Oral BID   Followed by  . [START ON 05/13/2021] apixaban  5 mg Oral BID  . carvedilol  3.125 mg Oral BID WC  . digoxin  0.125 mg Oral Daily  . furosemide  20 mg Intravenous Once  . [START ON 05/07/2021] insulin aspart  0-15 Units Subcutaneous TID WC  . potassium chloride  40 mEq Oral Q4H  . sacubitril-valsartan  1 tablet Oral BID     Infusions:    Assessment/Plan   1. Acute on chronic systolic HF with R>>L symptoms - Suspect HTN CM but also at risk for iCM - Echo 2020  EF 20% markedly dilated. RV moderately down  - Cath arranged but did not f/u  - I did bedside echo today EF 20% RV dilated and severely HK - He has surprisingly mild symptoms (R>L) in the face of severe biventricular cardiomyopathy but remains quite tachycardic - IV diuresis - Start GDMT  - eventual R/L cath+/- cMRI prior to d/c  2. HTN, uncontrolled - starting GDMT as above  3.Acute RLL subsegmental PE - heparin for now - u/s LEs  negative for DVT  4. Large R pleural effusion - likely need thora if not improving with diuresis   5. 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 Elevated blood glucose - likely new DM2 diagnosis.  - per FTPS. Check HgBa1c  7. Elevated bilirubin - suspect related to RV failure.   Length of Stay: 0  Glori Bickers, MD  05/06/2021, 6:42 PM  Advanced Heart Failure Team Pager [phone] (M-F; 7a - 5p)  Please contact [facility] for night-coverage after hours (4p -7a ) and weekends on [email or web address]

## 2021-05-06 NOTE — Plan of Care (Signed)
  Problem: Safety: Goal: Ability to remain free from injury will improve Outcome: Progressing   Problem: Activity: Goal: Capacity to carry out activities will improve Outcome: Progressing   

## 2021-05-06 NOTE — ED Notes (Signed)
Provider in room when this RN attempted to start heparin drip. Provider stated to hold off on heparin drip due to possibly starting patient on a oral medication.

## 2021-05-06 NOTE — ED Provider Notes (Signed)
[facility] EMERGENCY DEPARTMENT Provider Note   CSN: [id number] Arrival date & time: 05/06/21  1105     History No chief complaint on file.   Treydon Henricks is a 60 y.o. male with PMH/o CHF who presents for evaluation of left sided chest pain that began this morning. He states when he woke up this morning, he noticed some pain to left side.  He states that it is in his rib area between his chest and his abdomen.  He states that it has been intermittently occurring and states that he notices when he takes a deep breath and it makes the pain worse.  He reports that he was seen about a week ago for some nasal congestion, cough.  He thought initially, his symptoms were due to that but states that he was concerned because it was persisting.  He reports that over the last week or so, he has had some intermittent constipation, diarrhea.  He has not had any nausea/vomiting.  He denies any fevers, shortness of breath, chills.  He states he has been off his Entresto for couple weeks.  He states he has noted some swelling but states that he feels like his left leg is more swollen, more erythematous than his right leg.  He has not noted any overlying warmth.  No numbness/weakness.  He states he had a blood clot in 2018 that he thinks was from being in the hospital for a month.  He states that he was on blood thinners but was told that he did not take them anymore so he has not been on them.  The history is provided by the patient.       Past Medical History:  Diagnosis Date  . CHF (congestive heart failure) ([place])   . Chronic congestive heart failure ([place]) 08/27/2019  . Renal disorder    kidney stones    Patient Active Problem List   Diagnosis Date Noted  . Chronic congestive heart failure ([place]) 08/27/2019    History reviewed. No pertinent surgical history.     No family history on file.  Social History   Tobacco Use  . Smoking status: Never Smoker  . Smokeless tobacco:  Never Used    Home Medications Prior to Admission medications   Medication Sig Start Date End Date Taking? Authorizing Provider  carvedilol (COREG) 3.125 MG tablet TAKE 1 TABLET (3.125 MG TOTAL) BY MOUTH 2 (TWO) TIMES DAILY. PLEASE CALL FOR OFFICE VISIT [phone] 09/30/20 09/30/21  Bensimhon, Shaune Pascal, MD  digoxin (LANOXIN) 0.125 MG tablet Take 1 tablet (0.125 mg total) by mouth daily. 04/24/21 04/24/22  Bensimhon, Shaune Pascal, MD  furosemide (LASIX) 40 MG tablet Take 1 tablet (40 mg total) by mouth 2 (two) times daily. 04/24/21   Bensimhon, Shaune Pascal, MD  guaiFENesin (MUCINEX) 600 MG 12 hr tablet Take 1 tablet (600 mg total) by mouth 2 (two) times daily for 10 days. 04/30/21 05/10/21  LampteyMyrene Galas, MD  sacubitril-valsartan (ENTRESTO) 49-51 MG Take 1 tablet by mouth 2 (two) times daily. Must keep further appointments for refills 04/24/21   Bensimhon, Shaune Pascal, MD    Allergies    Patient has no known allergies.  Review of Systems   Review of Systems  Constitutional: Negative for fever.  Respiratory: Negative for cough and shortness of breath.   Cardiovascular: Positive for chest pain and leg swelling. Negative for palpitations.  Gastrointestinal: Negative for abdominal pain, nausea and vomiting.  Genitourinary: Negative for dysuria and hematuria.  Neurological: Negative for headaches.  All other systems reviewed and are negative.   Physical Exam Updated Vital Signs BP (!) 142/96   Pulse (!) 105   Temp 97.8 F (36.6 C) (Oral)   Resp (!) 21   SpO2 96%   Physical Exam Vitals and nursing note reviewed.  Constitutional:      Appearance: Normal appearance. He is well-developed.  HENT:     Head: Normocephalic and atraumatic.  Eyes:     General: Lids are normal.     Conjunctiva/sclera: Conjunctivae normal.     Pupils: Pupils are equal, round, and reactive to light.  Cardiovascular:     Rate and Rhythm: Regular rhythm. Tachycardia present.     Pulses: Normal pulses.           Dorsalis pedis pulses are 2+ on the right side and 2+ on the left side.     Heart sounds: Normal heart sounds. No murmur heard. No friction rub. No gallop.   Pulmonary:     Effort: Pulmonary effort is normal.     Breath sounds: Normal breath sounds.     Comments: Lungs clear to auscultation bilaterally.  Symmetric chest rise.  No wheezing, rales, rhonchi. Abdominal:     Palpations: Abdomen is soft. Abdomen is not rigid.     Tenderness: There is no abdominal tenderness. There is no guarding.  Musculoskeletal:        General: Normal range of motion.     Cervical back: Full passive range of motion without pain.     Comments: Non-pitting edema noted to BLE, L>R. LLE has some mild overlying erythema, no warmth. No calf tenderness noted.   Skin:    General: Skin is warm and dry.     Capillary Refill: Capillary refill takes less than 2 seconds.     Comments: Good distal cap refill. LLE is not dusky in appearance or cool to touch.  Neurological:     Mental Status: He is alert and oriented to person, place, and time.  Psychiatric:        Speech: Speech normal.     ED Results / Procedures / Treatments   Labs (all labs ordered are listed, but only abnormal results are displayed) Labs Reviewed  BASIC METABOLIC PANEL - Abnormal; Notable for the following components:      Result Value   Potassium 3.0 (*)    Chloride 97 (*)    Glucose, Bld 327 (*)    Creatinine, Ser 1.83 (*)    Calcium 8.6 (*)    GFR, Estimated 42 (*)    All other components within normal limits  BRAIN NATRIURETIC PEPTIDE - Abnormal; Notable for the following components:   B Natriuretic Peptide 961.5 (*)    All other components within normal limits  HEPATIC FUNCTION PANEL - Abnormal; Notable for the following components:   Total Bilirubin 2.7 (*)    Bilirubin, Direct 0.6 (*)    Indirect Bilirubin 2.1 (*)    All other components within normal limits  TROPONIN I (HIGH SENSITIVITY) - Abnormal; Notable for the following  components:   Troponin I (High Sensitivity) 59 (*)    All other components within normal limits  TROPONIN I (HIGH SENSITIVITY) - Abnormal; Notable for the following components:   Troponin I (High Sensitivity) 48 (*)    All other components within normal limits  URINE CULTURE  RESP PANEL BY RT-PCR (FLU A&B, COVID) ARPGX2  CBC  LIPASE, BLOOD  URINALYSIS, ROUTINE W REFLEX MICROSCOPIC  EKG EKG Interpretation  Date/Time:  Wednesday May 06 2021 11:21:13 EDT Ventricular Rate:  112 PR Interval:  166 QRS Duration: 110 QT Interval:  368 QTC Calculation: 502 R Axis:   -62 Text Interpretation: Sinus tachycardia Left axis deviation Nonspecific T wave abnormality Abnormal ECG Confirmed by Carmin Muskrat [phone]) on 05/06/2021 3:17:24 PM   Radiology DG Chest 2 View  Result Date: 05/06/2021 CLINICAL DATA:  Chest pain EXAM: CHEST - 2 VIEW COMPARISON:  06/20/2019 FINDINGS: Persistent or recurrent right pleural effusion with adjacent atelectasis. Probable mild pulmonary edema. No pneumothorax. Similar cardiomegaly. IMPRESSION: Persistent or recurrent moderate right pleural effusion with adjacent atelectasis. Cardiomegaly with probable mild pulmonary edema. Electronically Signed   By: Macy Mis M.D.   On: 05/06/2021 12:21   VAS [place] LOWER EXTREMITY VENOUS (DVT) (ONLY [facility] & [facility] 7a-7p)  Result Date: 05/06/2021  Lower Venous DVT Study Patient Name:  TIMMEY LAMBA  Date of Exam:   05/06/2021 Medical Rec #: [id number]      Accession #:    [id number] Date of Birth: 1961/10/02     Patient Gender: M Patient Age:   59Y Exam Location:  [facility] Procedure:      VAS [place] LOWER EXTREMITY VENOUS (DVT) Referring Phys: [id number] [place] --------------------------------------------------------------------------------  Indications: Edema.  Comparison Study: Exam at [facility] 02/01/18 - extensive DVT to LLE                   from"iliac to ankle" per report Performing Technologist: Rogelia Rohrer   Examination Guidelines: A complete evaluation includes B-mode imaging, spectral Doppler, color Doppler, and power Doppler as needed of all accessible portions of each vessel. Bilateral testing is considered an integral part of a complete examination. Limited examinations for reoccurring indications may be performed as noted. The reflux portion of the exam is performed with the patient in reverse Trendelenburg.  +---------+---------------+---------+-----------+----------+--------------+ RIGHT    CompressibilityPhasicitySpontaneityPropertiesThrombus Aging +---------+---------------+---------+-----------+----------+--------------+ CFV      Full           Yes      Yes                                 +---------+---------------+---------+-----------+----------+--------------+ SFJ      Full                                                        +---------+---------------+---------+-----------+----------+--------------+ FV Prox  Full           Yes      Yes                                 +---------+---------------+---------+-----------+----------+--------------+ FV Mid   Full           Yes      Yes                                 +---------+---------------+---------+-----------+----------+--------------+ FV DistalFull           Yes      Yes                                 +---------+---------------+---------+-----------+----------+--------------+  PFV      Full                                                        +---------+---------------+---------+-----------+----------+--------------+ POP      Full           Yes      Yes                                 +---------+---------------+---------+-----------+----------+--------------+ PTV      Full                                                        +---------+---------------+---------+-----------+----------+--------------+ PERO     Full                                                         +---------+---------------+---------+-----------+----------+--------------+   +---------+---------------+---------+-----------+----------+--------------+ LEFT     CompressibilityPhasicitySpontaneityPropertiesThrombus Aging +---------+---------------+---------+-----------+----------+--------------+ CFV      Full           Yes      Yes                                 +---------+---------------+---------+-----------+----------+--------------+ SFJ      Full                                                        +---------+---------------+---------+-----------+----------+--------------+ FV Prox  Full           Yes      Yes                                 +---------+---------------+---------+-----------+----------+--------------+ FV Mid   Full           Yes      Yes                                 +---------+---------------+---------+-----------+----------+--------------+ FV DistalFull           Yes      Yes                                 +---------+---------------+---------+-----------+----------+--------------+ PFV      Full                                                        +---------+---------------+---------+-----------+----------+--------------+  POP      Full           Yes      Yes                                 +---------+---------------+---------+-----------+----------+--------------+ PTV      Full                                                        +---------+---------------+---------+-----------+----------+--------------+ PERO     Full                                                        +---------+---------------+---------+-----------+----------+--------------+    Summary: BILATERAL: - No evidence of deep vein thrombosis seen in the lower extremities, bilaterally. - No evidence of superficial venous thrombosis in the lower extremities, bilaterally. -No evidence of popliteal cyst, bilaterally. -Subcutaneous edema in BLE from knee to  ankle.  *See table(s) above for measurements and observations.    Preliminary     Procedures .Critical Care Performed by: Volanda Napoleon, PA-C Authorized by: Volanda Napoleon, PA-C   Critical care provider statement:    Critical care time (minutes):  45   Critical care was necessary to treat or prevent imminent or life-threatening deterioration of the following conditions:  Circulatory failure   Critical care was time spent personally by me on the following activities:  Discussions with consultants, evaluation of patient's response to treatment, examination of patient, ordering and performing treatments and interventions, ordering and review of laboratory studies, ordering and review of radiographic studies, pulse oximetry, re-evaluation of patient's condition, obtaining history from patient or surrogate and review of old charts   I assumed direction of critical care for this patient from another provider in my specialty: yes     Care discussed with: admitting provider       Medications Ordered in ED Medications  iohexol (OMNIPAQUE) 350 MG/ML injection 75 mL (75 mLs Intravenous Contrast Given 05/06/21 1449)    ED Course  I have reviewed the triage vital signs and the nursing notes.  Pertinent labs & imaging results that were available during my care of the patient were reviewed by me and considered in my medical decision making (see chart for details).    MDM Rules/Calculators/A&P                          60 year old male who presents for evaluation of left-sided chest pain that began today.  Reports is mainly in his side.  States it hurts more with deep inspiration.  No shortness of breath, palpitations.  States he has been off his Delene Loll and has noted that his legs have been more swollen.  He states feels like his left is greater than right.  History of blood clots in 2018.  No longer on blood thinners.  On initially arrival, he is slightly tachycardic, tachypneic.  He is  afebrile.  Vitals otherwise stable.  He is well-appearing.  On exam, he does have lower extremity edema, left greater than right.  Given his  history, will obtain DVT study to evaluate for possible DVT.  At this time, given his pleuritic chest pain and tachycardia, clinically concerned about PE but given current contrast shortage, will hold off until we have ultrasound results back.  CBC shows no leukocytosis. Trop is elevated at 59. BMP shows potassium of 3.0. BUN of 18, Cr of 1.83. LFTs shows T.bili of 2.7. BNP is 961.5  Chest x-ray shows persistent recurrent moderate right pleural effusion.  There is cardiomegaly with mild pulmonary edema.  DVT study negative.   CTA shows subsegmental PE. Given his elevated troponins, will plan for admission. Heparin ordered.  IMPRESSION: 1. LEFT lower lobe emboli and LEFT upper lobe emboli segmental and subsegmental level. 2. Poor opacification of RIGHT pulmonary vascular bed in the lower lobe in the setting of chronic airspace disease and effusion. (Similar to the prior study) difficult to exclude small embolus in this location potentially with segmental or subsegmental emboli. 3. Persistent global cardiac enlargement and signs of cardiac dysfunction with reflux of contrast into hepatic veins as on the previous study. Consider correlation with echocardiography as warranted. 4. Enlargement of subcarinal lymph nodes, potentially reactive. Given persistent airspace process in the RIGHT lung base and nodal enlargement would suggest pulmonology follow-up for further assessment and consideration of at a minimum three-month follow-up CT for further assessment.  Discussed with family medicine.  They will come evaluate patient in the ED and plan for admission.  They would like me to hold off on heparin to see if patient is a candidate for NOAC.   Portions of this note were generated with [employer]. Dictation errors may occur despite best  attempts at proofreading.  This patient was evaluated during a time of global shortage of iodinated contrast media. Based on guidance from the [employer] of Radiology, best practices, and local institutional approaches an alternative path for evaluating and managing the patient may have been employed in order to provide optimal care during this shortage. The current situation has been discussed with the patient.  Final Clinical Impression(s) / ED Diagnoses Final diagnoses:  Other acute pulmonary embolism, unspecified whether acute cor pulmonale present ([place])  Elevated troponin    Rx / DC Orders ED Discharge Orders    None       Desma Mcgregor 05/06/21 1548    Carmin Muskrat, MD 05/07/21 [phone]

## 2021-05-06 NOTE — Hospital Course (Addendum)
Kurt Woodard is a 60 y.o. male who presented with left-sided chest pain, found to have subsegmental pulmonary emboli. Reported PMH is only significant for CHF.  Subsegmental pulmonary emboli Patient was tachycardic to 110s, tachypneic to 20s and hypertensive to 301S systolic in the ED prior to admission.  Reassuringly, he was saturating well on room air.  ED work-up notable for negative DVT ultrasound.  CTA with evidence of left lower lobe emboli and left upper lobe emboli segmental and subsegmental level.  Troponins flat at 59 and 48.  EKG without ST segment abnormalities.  Patient was started on heparin gtt.  Echocardiogram demonstrated no LV thrombus but severely reduced EF at 10-15%, more info below.  Patient remained hemodynamically stable throughout admission and did not require any supplemental respiratory support.  Was able to be transitioned to apixaban on 5/27 after right heart cath performed.  Discharged to continue 6 days of 10 mg BID Abixaban and then transition to 5 mg BID.  Acute on Chronic CHF Exacerabation BNP 961.5. Examination notable for pitting edema, pulmonary edema on CXR. Cardiology consulted. Patient diuresed initially with IV Lasix 80 BID. Given slight creatinine bump the following day without much output, further diuresis was held. Echo showed EF of 10-15% with global hypokinesis, moderate to severe mitral and tricuspid regurgitation and dilated IVC. Patient underwent R/L heart cath on 5/27 which showed minimal CAD, severe non-ischemic cardiomyopathy, mild PAH and mildly elevated filling pressure with relatively preserved CO. Was unfortunately unable to get cardiac MRI in the hospital, recommended for follow up outpatient. Continued on digoxin and entresto during hospitalization. Restarted on diuretics of Lasix and Spironolactone prior to d/c.   Acute Kidney Injury on CKD  Creatinine 1.83 on admission. Was briefly started on diuresis with IV Lasix 80 BID but patient without  significant UOP and with slight bump in creatinine. Diuresis stopped after 1-day and creatinine improved. Creatinine 1.53 on d/c.   Chronic Right Pleural Effusion Patient had 1100 cc of serosanguinous fluid drained by thoracentesis on 5/26. Fluid LDH slightly increased at 56, Pleural fluid Glucose 228, Fluid Amylase 12, fluid protein <3. Cytology without WBC or organisms seen. Fluid culture without growth x1 day.  Transudative process. Pulmonology felt this was most likely caused by asymmetric chronic volume overload in the setting of mitral valve regurgitation.   Subcarinal Lymph Nodes CTA demonstrated subcarinal lymph nodes that could be reactive. Also with persistent airspace in the right lung base and nodal enlargement and repeat CT assessment in 94-months with pulmonology follow up for further assessment.   Poorly Controlled T2DM Hgb A1c 11.6. Not previously on home medications. CBG's monitored and patient was on Lantus 10U and moderate SSI.  Discharged with Jardiance 10 mg daily and Metformin 500 mg BID. Patient was not amenable to starting Lantus on discharge.   Follow-up recommendations Ensure that patient has age-appropriate cancer screening i.e. colonoscopy Needs repeat CT in 3 months and pulmology follow up for further assessment of subcarinal lymph nodes and persistent airspace in right long base Will need to follow up with cardiology for his severe HFrEF Consider doing outpatient [place] starting insulin, was strongly recommended to start given his uncontrolled diabetes. 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 Discharged with Jardiance 10 mg daily and Metformin 500 mg BID. Metformin limit should be 1500 mg with his GFR.

## 2021-05-06 NOTE — ED Notes (Signed)
Pt back from vascular.

## 2021-05-06 NOTE — ED Provider Notes (Signed)
Emergency Medicine Provider Triage Evaluation Note  Kurt Woodard , a 60 y.o. male  was evaluated in triage.  Pt complains of left sided chest pain that started this morning around 7 oclock. No palpitations, SOB. Pain worse with inspiration, Non productive cough. Constipation /diarrhea alternating and bloating x 3 weeks.   Review of Systems  Positive: CP, N, constipation. Diarrhea Negative: SOB, palpitations, fevers, chills  Physical Exam  BP (!) 140/101 (BP Location: Right Arm)   Pulse (!) 110   Temp 97.8 F (36.6 C) (Oral)   Resp 18   SpO2 99%  Gen:   Awake, no distress   Resp:  Normal effort  MSK:   Moves extremities without difficulty  Other:  LEFT CVAT  Medical Decision Making  Medically screening exam initiated at 11:28 AM.  Appropriate orders placed.  Kurt Woodard was informed that the remainder of the evaluation will be completed by another provider, this initial triage assessment does not replace that evaluation, and the importance of remaining in the ED until their evaluation is complete.  This chart was dictated using voice recognition software, Dragon. Despite the best efforts of this provider to proofread and correct errors, errors may still occur which can change documentation meaning.    Emeline Darling, PA-C 05/06/21 1132    Lorelle Gibbs, DO 05/06/21 1636

## 2021-05-06 NOTE — ED Notes (Signed)
Pt to vascular.

## 2021-05-06 NOTE — H&P (Addendum)
[facility] Admission History and Physical Service Pager: [phone]  Patient name: Kurt Woodard Medical record number: [id number] Date of birth: Oct 30, 1961 Age: 60 y.o. Gender: male  Primary Care Provider: Patient, No Pcp Per (Inactive) Consultants: None Code Status: FULL   Preferred Emergency Contact: Harvie Junior [phone]  Chief Complaint: Left-side pain  Assessment and Plan: Kurt Woodard is a 60 y.o. male presenting with left-sided chest pain since this morning, found to have subsegmental pulmonary emboli. Reported PMH is only significant for CHF.   Subsegmental pulmonary emboli Hypertensive, tachycardic and tachypneic in ED but saturating well on room air. Patient has history of DVT in 2019 that appeared to be provoked from being sedentary.  His cause of PE at this time is likely similar, as he states he has been mostly sitting for the last few weeks due to a GI illness, also in the setting of recent COVID illness about 1 month ago.  No recent travel, surgery or history of blood clotting disorders.  Unclear if patient is up-to-date on age-appropriate cancer screening as he cannot recall if he had a colonoscopy.  Must consider malignancy as a cause of pulmonary embolus.  Reassuringly denies any B-symptoms. Examination also notable for left leg swelling but DVT ultrasound negative. CTA with evidence of left lower lobe emboli and left upper lobe emboli segmental and subsegmental level. EKG without ST segment abnormalities concerning for ischemia. Troponin's 59>48, likely in the setting of demand ischemia.  Will obtain echocardiogram to assess for right heart strain.  Reassuring that patient is hemodynamically stable, appears well on examination, is in no distress and without need for respiratory support.  Will admit for further monitoring on telemetry and start on oral anticoagulation.  Addendum: Discussed with cardiologist, Dr. Haroldine Laws.  Prefers patient be  started on heparin GGT as opposed to apixaban given concern for potential future procedure given his pleural effusion and likely reduced ejection fraction. -Admit to FPTS observation, med telemetry, attending Dr. Nori Riis -Vitals per floor protocol -Continuous telemetry and pulse ox -Keep oxygen saturation >90% -Heparin gtt per pharmacy  -F/U echocardiogram -Tylenol 650 mg q6h PRN mild pain -Miralax 17g PRN constipation -Heart healthy diet  Acute on chronic CHF exacerbation Follows with Dr. Haroldine Laws.  Appears that he has an appointment scheduled in August for repeat echocardiogram.  His home medications are notable for carvedilol 3.125 twice daily, digoxin 0.125 mg daily, Lasix 40 mg twice daily, Entresto 49-51 mg twice daily.  He has not taking any of these medications for the last few weeks since he ran out.  On examination today, no obvious JVD but does have 2+ pitting edema bilaterally, more so in left lower extremity.  Chest x-ray with mild pulmonary edema.  BNP elevated at 961.5.  We will start gentle diuresis as we replete his potassium. -Cardiology consulted, appreciate recommendations -Repeat echocardiogram -Continue carvedilol, Entresto, digoxin -20 mg IV Lasix after some potassium repletion -Strict I/O -Daily weights  Acute kidney injury on CKD stage 3b Likely prerenal due to CHF exacerbation.  Creatinine 1.83, increased from 1.18 in 08/2019.  Expect that this will improve with diuresis. -BMP daily  Hypokalemia Potassium of 3.  Patient home medication includes Lasix but he has been out of this for several weeks. EKG today in sinus rhythm. -Replete with 40 mEq every 4 hours x 3 -Repeat BMP in a.m.  Elevated glucose Glucose 327 on BMP.  Patient denies history of type 2 diabetes.  No recent hemoglobin A1c to confirm.  Per  chart review, there is documented history of type 2 diabetes in 2019.  Patient is not on any home medications for this. -Hemoglobin A1c -CBGs  AC/nightly -mSSI  Elevated Bilirubin Elevated total bilirubin 2.7, direct 0.6, indirect 2.1.  Differential includes biliary obstruction, intrahepatic cholestasis, hepatocellular injury, and hepatocellular defects with excretion such as Dubin-Johnson or rotor syndrome.  Abdominal exam benign.  Patient denies any abdominal tenderness. -Monitor with CMP -Will need GI referral outpatient for screening colonoscopy, could also further assess etiology if elevations persist  Enlarged subcarinal lymph nodes Seen on CTA, potentially could be reactive. Also does have a persistent airspace process in the right lung base and nodal enlargement and should have a repeat CT for further assessment in 3 months and pulmonology follow up.  -Repeat CT 30-months -Pulmonology follow up outpatient for further assessment  Lipoma Patient with several soft, oval, doughy feeling lumps consistent with lipoma.  He has 3 on his head, and 1 on his left forearm.  States that he has a family history of this.  He has had these removed in the past. -Consider [facility] Derm/dermatology follow-up for removal  Social barriers Patient without a PCP.  He notes that he has been out of all of his cardiac medications for few weeks.  He reports not having health insurance and has financial difficulties.  Patient is at high risk for being lost to follow-up and for readmission if he does not have the proper medication outpatient. -TOC consult for assistance  Health Maintenance Overdue for colonoscopy for cancer screening.  -We will need GI referral for outpatient colonoscopy -Lipid panel -TSH -Hemoglobin A1c   FEN/GI: Heart healthy Prophylaxis: Heparin per pharmacy  Disposition: Med telemetry  History of Present Illness:  Kurt Woodard is a 60 y.o. male presenting with acute left sided chest pain.  Had left-sided chest pain that started about 6-7AM. At first thought it may have been gas. It just kept staying there, thought "I may want  to get that checked". Felt like an aching, sharp pain. Has been constant since this morning. No chest pain, no difficulty breathing. Uncomfortable with deep breaths.   Had a stomach bug with diarrhea. Also had some intermittent constipation. Has been going on for the last 4 weeks. Was out mowing the lawn and thinks his nose and lungs got all clogged up with mucus. Was seen at Urgent Care recently for this, was prescribed something but "it wasn't worth my money".   Right now belly is not hurting. No blood in your stools, hasn't noticed change in stools. Cant remember if hes had a colonoscopy before.   Sees cardiologist (Dr. B), has appointment in August for repeat echo, was placed on Entresto. Out of medications for a couple weeks. Feels that Delene Loll helps to give him "all the energy in the world". Has been out of his fluid pill as well, feels that his legs are a little more swollen.   2018 had DVT. Was at home with pneumonia and stayed in bed for about a month which is what he believes caused the DVT.   Has been sitting most of the day for the last couple of weeks because of his stomach bug. No recent surgeries, travel or injuries. No history of blood clotting disorders.   Denies ever smoking tobacco. No drugs or alcohol.    Review Of Systems: Per HPI with the following additions:   Review of Systems  Constitutional: Negative for chills, diaphoresis, fatigue, fever and unexpected weight change.  HENT:  Negative for rhinorrhea, sneezing and sore throat.   Respiratory: Positive for cough. Negative for chest tightness, shortness of breath and wheezing.   Cardiovascular: Positive for leg swelling. Negative for chest pain and palpitations.  Gastrointestinal: Positive for constipation and diarrhea. Negative for abdominal pain, blood in stool, nausea and vomiting.  Genitourinary: Negative for dysuria and hematuria.  Musculoskeletal: Negative for joint swelling and myalgias.  Neurological: Negative  for dizziness, weakness, light-headedness and headaches.  Hematological: Does not bruise/bleed easily.     Patient Active Problem List   Diagnosis Date Noted  . Chronic congestive heart failure ([place]) 08/27/2019    Past Medical History: Past Medical History:  Diagnosis Date  . CHF (congestive heart failure) ([place])   . Chronic congestive heart failure ([place]) 08/27/2019  . Renal disorder    kidney stones    Past Surgical History: History reviewed. No pertinent surgical history.  No surgeries - had stent and fluid for DVT   Social History: Social History   Tobacco Use  . Smoking status: Never Smoker  . Smokeless tobacco: Never Used   Additional social history: Lives alone, able to do all ADL's independently. Does odd jobs.  Please also refer to relevant sections of EMR.  Family History: No family history on file. None  Allergies and Medications: No Known Allergies No current facility-administered medications on file prior to encounter.   Current Outpatient Medications on File Prior to Encounter  Medication Sig Dispense Refill  . carvedilol (COREG) 3.125 MG tablet TAKE 1 TABLET (3.125 MG TOTAL) BY MOUTH 2 (TWO) TIMES DAILY. PLEASE CALL FOR OFFICE VISIT [phone] 60 tablet 3  . digoxin (LANOXIN) 0.125 MG tablet Take 1 tablet (0.125 mg total) by mouth daily. 30 tablet 3  . furosemide (LASIX) 40 MG tablet Take 1 tablet (40 mg total) by mouth 2 (two) times daily. 120 tablet 3  . guaiFENesin (MUCINEX) 600 MG 12 hr tablet Take 1 tablet (600 mg total) by mouth 2 (two) times daily for 10 days. 20 tablet 0  . sacubitril-valsartan (ENTRESTO) 49-51 MG Take 1 tablet by mouth 2 (two) times daily. Must keep further appointments for refills 60 tablet 3    Objective: BP (!) 142/96   Pulse (!) 105   Temp 97.8 F (36.6 C) (Oral)   Resp (!) 21   SpO2 96%  Exam: General: Awake, alert, pleasant, in no distress, conversational Eyes: Nonicteric, EOMI ENTM: Nares patent, oropharynx  without erythema or exudate, mucous membranes moist Neck: Supple, no JVD Cardiovascular: RRR, systolic murmur appreciated, loudest at left lower sternal border Respiratory: Breathing comfortably on room air, clear lung sounds appreciated at bases, diminished upper lung sounds bilaterally Gastrointestinal: Soft, mildly distended, nontender in all quadrants, no rebound or guarding, normal active bowel sounds MSK: Able to move all extremities spontaneously, no obvious bony deformities Derm: Bilateral feet are cool to touch, onychomycosis b/l feet, several large oval-shaped soft lumps appreciated on head (3) and left forearm (1), multiple skin tags posterior neck Neuro: Speech is clear, able to answer questions appropriately, able to follow commands, without focal deficits Psych: Appropriate mood, good insight and judgment  Labs and Imaging: CBC BMET  Recent Labs  Lab 05/06/21 1127  WBC 10.0  HGB 15.2  HCT 48.6  PLT 197   Recent Labs  Lab 05/06/21 1127  NA 135  K 3.0*  CL 97*  CO2 26  BUN 18  CREATININE 1.83*  GLUCOSE 327*  CALCIUM 8.6*     EKG: Sinus tachycardia rate 112  bpm, LAD, without ST changes  DG Chest 2 View  Result Date: 05/06/2021 CLINICAL DATA:  Chest pain EXAM: CHEST - 2 VIEW COMPARISON:  06/20/2019 FINDINGS: Persistent or recurrent right pleural effusion with adjacent atelectasis. Probable mild pulmonary edema. No pneumothorax. Similar cardiomegaly. IMPRESSION: Persistent or recurrent moderate right pleural effusion with adjacent atelectasis. Cardiomegaly with probable mild pulmonary edema. Electronically Signed   By: Macy Mis M.D.   On: 05/06/2021 12:21   VAS [place] LOWER EXTREMITY VENOUS (DVT) (ONLY [facility] & [facility] 7a-7p)  Result Date: 05/06/2021  Lower Venous DVT Study Patient Name:  Kurt Woodard  Date of Exam:   05/06/2021 Medical Rec #: [id number]      Accession #:    [id number] Date of Birth: 09-Jun-1961     Patient Gender: M Patient Age:   59Y Exam Location:  [facility] Procedure:      VAS [place] LOWER EXTREMITY VENOUS (DVT) Referring Phys: [id number] [place] --------------------------------------------------------------------------------  Indications: Edema.  Comparison Study: Exam at [facility] 02/01/18 - extensive DVT to LLE                   from"iliac to ankle" per report Performing Technologist: Rogelia Rohrer  Examination Guidelines: A complete evaluation includes B-mode imaging, spectral Doppler, color Doppler, and power Doppler as needed of all accessible portions of each vessel. Bilateral testing is considered an integral part of a complete examination. Limited examinations for reoccurring indications may be performed as noted. The reflux portion of the exam is performed with the patient in reverse Trendelenburg.  +---------+---------------+---------+-----------+----------+--------------+ RIGHT    CompressibilityPhasicitySpontaneityPropertiesThrombus Aging +---------+---------------+---------+-----------+----------+--------------+ CFV      Full           Yes      Yes                                 +---------+---------------+---------+-----------+----------+--------------+ SFJ      Full                                                        +---------+---------------+---------+-----------+----------+--------------+ FV Prox  Full           Yes      Yes                                 +---------+---------------+---------+-----------+----------+--------------+ FV Mid   Full           Yes      Yes                                 +---------+---------------+---------+-----------+----------+--------------+ FV DistalFull           Yes      Yes                                 +---------+---------------+---------+-----------+----------+--------------+ PFV      Full                                                        +---------+---------------+---------+-----------+----------+--------------+  POP      Full            Yes      Yes                                 +---------+---------------+---------+-----------+----------+--------------+ PTV      Full                                                        +---------+---------------+---------+-----------+----------+--------------+ PERO     Full                                                        +---------+---------------+---------+-----------+----------+--------------+   +---------+---------------+---------+-----------+----------+--------------+ LEFT     CompressibilityPhasicitySpontaneityPropertiesThrombus Aging +---------+---------------+---------+-----------+----------+--------------+ CFV      Full           Yes      Yes                                 +---------+---------------+---------+-----------+----------+--------------+ SFJ      Full                                                        +---------+---------------+---------+-----------+----------+--------------+ FV Prox  Full           Yes      Yes                                 +---------+---------------+---------+-----------+----------+--------------+ FV Mid   Full           Yes      Yes                                 +---------+---------------+---------+-----------+----------+--------------+ FV DistalFull           Yes      Yes                                 +---------+---------------+---------+-----------+----------+--------------+ PFV      Full                                                        +---------+---------------+---------+-----------+----------+--------------+ POP      Full           Yes      Yes                                 +---------+---------------+---------+-----------+----------+--------------+ PTV  Full                                                        +---------+---------------+---------+-----------+----------+--------------+ PERO     Full                                                         +---------+---------------+---------+-----------+----------+--------------+    Summary: BILATERAL: - No evidence of deep vein thrombosis seen in the lower extremities, bilaterally. - No evidence of superficial venous thrombosis in the lower extremities, bilaterally. -No evidence of popliteal cyst, bilaterally. -Subcutaneous edema in BLE from knee to ankle.  *See table(s) above for measurements and observations.    Preliminary      Sharion Settler, DO 05/06/2021, 3:57 PM PGY-1, [place] Intern pager: [phone], text pages welcome  FPTS Upper-Level Resident Addendum   I have independently interviewed and examined the patient. I have discussed the above with the original author and agree with their documentation.Please see also any attending notes.   Gifford Shave, MD PGY-2, [place] [facility] 05/06/2021 6:17 PM  [place] Service pager: [phone] (text pages welcome through [place])

## 2021-05-06 NOTE — Progress Notes (Addendum)
ANTICOAGULATION CONSULT NOTE - Initial Consult  Pharmacy Consult for Apixaban Indication: pulmonary embolus / DVT  No Known Allergies  Patient Measurements:   Heparin Dosing Weight: 89.4 kg  Vital Signs: Temp: 97.8 F (36.6 C) (05/25 1113) Temp Source: Oral (05/25 1113) BP: 142/107 (05/25 1545) Pulse Rate: 107 (05/25 1545)  Labs: Recent Labs    05/06/21 1127 05/06/21 1327  HGB 15.2  --   HCT 48.6  --   PLT 197  --   CREATININE 1.83*  --   TROPONINIHS 59* 48*    CrCl cannot be calculated (Unknown ideal weight.).   Medical History: Past Medical History:  Diagnosis Date  . CHF (congestive heart failure) ([place])   . Chronic congestive heart failure ([place]) 08/27/2019  . Renal disorder    kidney stones    Medications:  (Not in a hospital admission)   Assessment: 72 YOF with acute subsegmental PE and DVT. Pharmacy consulted to start apixaban.   H/H and Plt wnl. SCr 1.83  Goal of Therapy:  Heparin level 0.3-0.7 units/ml Monitor platelets by anticoagulation protocol: Yes   Plan:  -Start apixaban 10 mg twice daily x 7 days followed by apixaban 5 mg twice daily  -Monitor CBC, renal fx, and s/s of bleeding -Educate prior to discharge   Albertina Parr, PharmD., BCPS, BCCCP Clinical Pharmacist Please refer to [facility] for unit-specific pharmacist   Addendum: After discussion with cardiology, patient is being switched to IV heparin. She has not received any doses of apixaban yet. Will d/c apixaban and give heparin 5000 units IV bolus followed by heparin infusion at 1500 units/hr. Will f/u 6 hr HL   Albertina Parr, PharmD., BCPS, BCCCP Clinical Pharmacist Please refer to [facility] for unit-specific pharmacist

## 2021-05-07 ENCOUNTER — Observation Stay (HOSPITAL_COMMUNITY): Payer: Self-pay

## 2021-05-07 ENCOUNTER — Observation Stay (HOSPITAL_BASED_OUTPATIENT_CLINIC_OR_DEPARTMENT_OTHER): Payer: Self-pay

## 2021-05-07 ENCOUNTER — Inpatient Hospital Stay (HOSPITAL_COMMUNITY): Payer: Self-pay

## 2021-05-07 DIAGNOSIS — I5023 Acute on chronic systolic (congestive) heart failure: Secondary | ICD-10-CM

## 2021-05-07 DIAGNOSIS — Z006 Encounter for examination for normal comparison and control in clinical research program: Secondary | ICD-10-CM

## 2021-05-07 DIAGNOSIS — J90 Pleural effusion, not elsewhere classified: Secondary | ICD-10-CM

## 2021-05-07 DIAGNOSIS — I2693 Single subsegmental pulmonary embolism without acute cor pulmonale: Secondary | ICD-10-CM

## 2021-05-07 DIAGNOSIS — E1165 Type 2 diabetes mellitus with hyperglycemia: Secondary | ICD-10-CM

## 2021-05-07 LAB — ECHOCARDIOGRAM COMPLETE
AR max vel: 1.94 cm2
AV Area VTI: 2.57 cm2
AV Area mean vel: 1.82 cm2
AV Mean grad: 1 mmHg
AV Peak grad: 1.8 mmHg
Ao pk vel: 0.67 m/s
Area-P 1/2: 5.02 cm2
Height: 70 in
P 1/2 time: 422 msec
S' Lateral: 6 cm
Weight: 3347.46 oz

## 2021-05-07 LAB — COMPREHENSIVE METABOLIC PANEL
ALT: 16 U/L (ref 0–44)
AST: 18 U/L (ref 15–41)
Albumin: 3.2 g/dL — ABNORMAL LOW (ref 3.5–5.0)
Alkaline Phosphatase: 86 U/L (ref 38–126)
Anion gap: 11 (ref 5–15)
BUN: 19 mg/dL (ref 6–20)
CO2: 27 mmol/L (ref 22–32)
Calcium: 8.4 mg/dL — ABNORMAL LOW (ref 8.9–10.3)
Chloride: 97 mmol/L — ABNORMAL LOW (ref 98–111)
Creatinine, Ser: 1.9 mg/dL — ABNORMAL HIGH (ref 0.61–1.24)
GFR, Estimated: 40 mL/min — ABNORMAL LOW (ref >60)
Glucose, Bld: 232 mg/dL — ABNORMAL HIGH (ref 70–99)
Potassium: 3.7 mmol/L (ref 3.5–5.1)
Sodium: 135 mmol/L (ref 135–145)
Total Bilirubin: 2.4 mg/dL — ABNORMAL HIGH (ref 0.3–1.2)
Total Protein: 6.4 g/dL — ABNORMAL LOW (ref 6.5–8.1)

## 2021-05-07 LAB — GLUCOSE, CAPILLARY
Glucose-Capillary: 249 mg/dL — ABNORMAL HIGH (ref 70–99)
Glucose-Capillary: 207 mg/dL — ABNORMAL HIGH (ref 70–99)
Glucose-Capillary: 183 mg/dL — ABNORMAL HIGH (ref 70–99)
Glucose-Capillary: 252 mg/dL — ABNORMAL HIGH (ref 70–99)

## 2021-05-07 LAB — TSH: TSH: 2.149 u[IU]/mL (ref 0.350–4.500)

## 2021-05-07 LAB — CBC
HCT: 46.4 % (ref 39.0–52.0)
Hemoglobin: 14.6 g/dL (ref 13.0–17.0)
MCH: 27.9 pg (ref 26.0–34.0)
MCHC: 31.5 g/dL (ref 30.0–36.0)
MCV: 88.7 fL (ref 80.0–100.0)
Platelets: 198 10*3/uL (ref 150–400)
RBC: 5.23 MIL/uL (ref 4.22–5.81)
RDW: 14.1 % (ref 11.5–15.5)
WBC: 8.2 10*3/uL (ref 4.0–10.5)
nRBC: 0 % (ref 0.0–0.2)

## 2021-05-07 LAB — LACTATE DEHYDROGENASE, PLEURAL OR PERITONEAL FLUID: LD, Fluid: 56 U/L — ABNORMAL HIGH (ref 3–23)

## 2021-05-07 LAB — BODY FLUID CELL COUNT WITH DIFFERENTIAL
Eos, Fluid: 0 %
Lymphs, Fluid: 88 %
Monocyte-Macrophage-Serous Fluid: 3 % — ABNORMAL LOW (ref 50–90)
Neutrophil Count, Fluid: 9 % (ref 0–25)
Total Nucleated Cell Count, Fluid: 225 cu mm (ref 0–1000)

## 2021-05-07 LAB — HEPARIN LEVEL (UNFRACTIONATED)
Heparin Unfractionated: 0.18 IU/mL — ABNORMAL LOW (ref 0.30–0.70)
Heparin Unfractionated: 0.46 IU/mL (ref 0.30–0.70)
Heparin Unfractionated: 0.37 IU/mL (ref 0.30–0.70)

## 2021-05-07 LAB — HIV ANTIBODY (ROUTINE TESTING W REFLEX): HIV Screen 4th Generation wRfx: NONREACTIVE

## 2021-05-07 LAB — PROTEIN, PLEURAL OR PERITONEAL FLUID: Total protein, fluid: <3 g/dL

## 2021-05-07 LAB — HEMOGLOBIN A1C
Hgb A1c MFr Bld: 11.6 % — ABNORMAL HIGH (ref 4.8–5.6)
Mean Plasma Glucose: 286 mg/dL

## 2021-05-07 LAB — GLUCOSE, PLEURAL OR PERITONEAL FLUID: Glucose, Fluid: 228 mg/dL

## 2021-05-07 MED ORDER — SPIRONOLACTONE 25 MG PO TABS: 25.0000 mg | ORAL_TABLET | Freq: Every day | ORAL | Status: DC

## 2021-05-07 MED ORDER — MAGNESIUM SULFATE 2 GM/50ML IV SOLN
2.0000 g | Freq: Once | INTRAVENOUS | Status: AC
  Administered 2021-05-07: 2 g via INTRAVENOUS
  Filled 2021-05-07: qty 50

## 2021-05-07 MED ORDER — BENZONATATE 100 MG PO CAPS
100.0000 mg | ORAL_CAPSULE | Freq: Three times a day (TID) | ORAL | Status: DC | PRN
  Administered 2021-05-07 – 2021-05-08 (×2): 100 mg via ORAL
  Filled 2021-05-07 (×2): qty 1

## 2021-05-07 MED ORDER — SODIUM CHLORIDE 0.9 % IV SOLN: 250.0000 mL | INTRAVENOUS | Status: DC | PRN

## 2021-05-07 MED ORDER — INSULIN ASPART 100 UNIT/ML IJ SOLN
0.0000 [IU] | Freq: Three times a day (TID) | INTRAMUSCULAR | Status: DC
Start: 2021-05-07 — End: 2021-05-09
  Administered 2021-05-07: 8 [IU] via SUBCUTANEOUS
  Administered 2021-05-08: 3 [IU] via SUBCUTANEOUS
  Administered 2021-05-08: 5 [IU] via SUBCUTANEOUS
  Administered 2021-05-09 (×2): 2 [IU] via SUBCUTANEOUS

## 2021-05-07 MED ORDER — HEPARIN BOLUS VIA INFUSION
4000.0000 [IU] | Freq: Once | INTRAVENOUS | Status: AC
  Administered 2021-05-07: 4000 [IU] via INTRAVENOUS
  Filled 2021-05-07: qty 4000

## 2021-05-07 MED ORDER — GUAIFENESIN-DM 100-10 MG/5ML PO SYRP
5.0000 mL | ORAL_SOLUTION | ORAL | Status: DC | PRN
  Administered 2021-05-07: 5 mL via ORAL
  Filled 2021-05-07: qty 5

## 2021-05-07 MED ORDER — SODIUM CHLORIDE 0.9% FLUSH: 3.0000 mL | INTRAVENOUS | Status: DC | PRN

## 2021-05-07 MED ORDER — SODIUM CHLORIDE 0.9% FLUSH
3.0000 mL | Freq: Two times a day (BID) | INTRAVENOUS | Status: DC
  Administered 2021-05-07 – 2021-05-09 (×2): 3 mL via INTRAVENOUS

## 2021-05-07 MED ORDER — SODIUM CHLORIDE 0.9 % IV SOLN: INTRAVENOUS | Status: DC

## 2021-05-07 MED ORDER — PERFLUTREN LIPID MICROSPHERE
1.0000 mL | INTRAVENOUS | Status: AC | PRN
  Administered 2021-05-07: 4 mL via INTRAVENOUS
  Filled 2021-05-07: qty 10

## 2021-05-07 MED ORDER — ASPIRIN 81 MG PO CHEW
81.0000 mg | CHEWABLE_TABLET | ORAL | Status: AC
  Administered 2021-05-08: 81 mg via ORAL
  Filled 2021-05-07: qty 1

## 2021-05-07 NOTE — Research (Signed)
IDENTIFY Informed Consent                  Subject Name: Kurt Woodard   Subject met inclusion and exclusion criteria.  The informed consent form, study requirements and expectations were reviewed with the subject and questions and concerns were addressed prior to the signing of the consent form.  The subject verbalized understanding of the trial requirements.  The subject agreed to participate in the IDENTIFY trial and signed the informed consent at 15:10PM on 05/07/21.  The informed consent was obtained prior to performance of any protocol-specific procedures for the subject.  A copy of the signed informed consent was given to the subject and a copy was placed in the subject's medical record.   Meade Maw , Naval architect

## 2021-05-07 NOTE — Progress Notes (Signed)
ANTICOAGULATION CONSULT NOTE - Initial Consult  Pharmacy Consult for Apixaban Indication: pulmonary embolus / DVT  No Known Allergies  Patient Measurements: Height: 5\' 10"  (177.8 cm) Weight: 94.9 kg (209 lb 3.5 oz) IBW/kg (Calculated) : 73 Heparin Dosing Weight: 89.4 kg  Vital Signs: Temp: 98.1 F (36.7 C) (05/26 1130) Temp Source: Oral (05/26 1130) BP: 119/87 (05/26 1130) Pulse Rate: 99 (05/26 1130)  Labs: Recent Labs    05/06/21 1127 05/06/21 1327 05/07/21 0231 05/07/21 1000  HGB 15.2  --  14.6  --   HCT 48.6  --  46.4  --   PLT 197  --  198  --   HEPARINUNFRC  --   --  0.18* 0.46  CREATININE 1.83*  --  1.90*  --   TROPONINIHS 59* 48*  --   --     Estimated Creatinine Clearance: 48.4 mL/min (A) (by C-G formula based on SCr of 1.9 mg/dL (H)).   Medical History: Past Medical History:  Diagnosis Date  . CHF (congestive heart failure) ([place])   . Chronic congestive heart failure ([place]) 08/27/2019  . Renal disorder    kidney stones    Medications:  Medications Prior to Admission  Medication Sig Dispense Refill Last Dose  . carvedilol (COREG) 3.125 MG tablet TAKE 1 TABLET (3.125 MG TOTAL) BY MOUTH 2 (TWO) TIMES DAILY. PLEASE CALL FOR OFFICE VISIT [phone] 60 tablet 3 Past Month at Unknown time  . digoxin (LANOXIN) 0.125 MG tablet Take 1 tablet (0.125 mg total) by mouth daily. 30 tablet 3 Past Month at Unknown time  . furosemide (LASIX) 40 MG tablet Take 1 tablet (40 mg total) by mouth 2 (two) times daily. 120 tablet 3 Past Month at Unknown time  . guaiFENesin (MUCINEX) 600 MG 12 hr tablet Take 1 tablet (600 mg total) by mouth 2 (two) times daily for 10 days. 20 tablet 0 Past Week at Unknown time  . sacubitril-valsartan (ENTRESTO) 49-51 MG Take 1 tablet by mouth 2 (two) times daily. Must keep further appointments for refills 60 tablet 3 Past Month at Unknown time    Assessment: 89 YOF with acute subsegmental PE and DVT. Pharmacy consulted to start heparin.    Today, patient is therapeutic (0.46) on heparin gtt at 1900 units/hr. CBC remains stable. Scr slightly up at 1.9. No bleeding or issues noted. Heparin to continue per HF team pending possible cath.   Goal of Therapy:  Heparin level 0.3-0.7 units/ml Monitor platelets by anticoagulation protocol: Yes   Plan:  Continue heparin gtt at 1900 units/hr  - F/u 6-hr confirmatory HL - Daily HL and CBC while on heparin - Monitor for s/sx of heparin   Claudina Lick, PharmD PGY1 Acute Care Pharmacy Resident 05/07/2021 11:37 AM  Please check [email or web address] for unit-specific pharmacy phone numbers.

## 2021-05-07 NOTE — Progress Notes (Addendum)
Advanced Heart Failure Rounding Note  PCP-Cardiologist: None    Patient Profile   [name] is a 60 y/o male with a h/o HTN, CKD 3b (Scr 1.8), DVT, systolic CHF and poor compliance. Presented w/ CP and LEE.   Hstrop 59 -> 48  BNP 961  Had CT of chest with subsegmental LL PE, Lrge R pleural effusion and evidence of HF with contrast reflux into hepatic veins. LE doppler negative for DVT   Bedside echo EF 20% RV dilated and severely HK  Subjective:    On IV heparin  On IV Lasix 80 mg bid. UOP not impressive, only 975 cc charted yesterday. Wt unchanged   SBPs 120s-150s SCr 1.8>1.9  K 3.7   CO2 ok at 27    Currently getting TTE. Feels ok. No resting dyspnea.     Objective:   Weight Range: 94.9 kg Body mass index is 30.02 kg/m.   Vital Signs:   Temp:  [97.8 F (36.6 C)-98.8 F (37.1 C)] 98 F (36.7 C) (05/26 0754) Pulse Rate:  [95-117] 97 (05/26 0754) Resp:  [16-31] 16 (05/26 0754) BP: (115-152)/(93-123) 120/96 (05/26 0754) SpO2:  [93 %-100 %] 100 % (05/26 0754) Weight:  [94.8 kg-94.9 kg] 94.9 kg (05/26 0500) Last BM Date: 05/06/21  Weight change: Filed Weights   05/06/21 1913 05/07/21 0046 05/07/21 0500  Weight: 94.8 kg 94.9 kg 94.9 kg    Intake/Output:   Intake/Output Summary (Last 24 hours) at 05/07/2021 0921 Last data filed at 05/07/2021 0811 Gross per 24 hour  Intake 955.56 ml  Output 975 ml  Net -19.44 ml      Physical Exam    General:  Well appearing, moderately obese. No resp difficulty HEENT: Normal Neck: Supple. JVP elevated to jaw . Carotids 2+ bilat; no bruits. No lymphadenopathy or thyromegaly appreciated. Cor: PMI nondisplaced. Regular rhythm, tachy rate. No rubs, gallops or murmurs. Lungs: decreased BS at the bases, no wheezing  Abdomen: obese, Soft, nontender, nondistended. No hepatosplenomegaly. No bruits or masses. Good bowel sounds. Extremities: No cyanosis, clubbing, rash, 1+ bilateral pretibial edema Neuro: Alert &  orientedx3, cranial nerves grossly intact. moves all 4 extremities w/o difficulty. Affect pleasant   Telemetry   NSR - sinus tach 90s-low 100s   EKG    No new EKG to review   Labs    CBC Recent Labs    05/06/21 1127 05/07/21 0231  WBC 10.0 8.2  HGB 15.2 14.6  HCT 48.6 46.4  MCV 90.7 88.7  PLT 197 563   Basic Metabolic Panel Recent Labs    05/06/21 1127 05/06/21 1940 05/07/21 0231  NA 135  --  135  K 3.0*  --  3.7  CL 97*  --  97*  CO2 26  --  27  GLUCOSE 327*  --  232*  BUN 18  --  19  CREATININE 1.83*  --  1.90*  CALCIUM 8.6*  --  8.4*  MG  --  1.5*  --    Liver Function Tests Recent Labs    05/06/21 1127 05/07/21 0231  AST 16 18  ALT 16 16  ALKPHOS 80 86  BILITOT 2.7* 2.4*  PROT 6.7 6.4*  ALBUMIN 3.5 3.2*   Recent Labs    05/06/21 1127  LIPASE 26   Cardiac Enzymes No results for input(s): CKTOTAL, CKMB, CKMBINDEX, TROPONINI in the last 72 hours.  BNP: BNP (last 3 results) Recent Labs    05/06/21 1127  BNP 961.5*  ProBNP (last 3 results) No results for input(s): PROBNP in the last 8760 hours.   D-Dimer No results for input(s): DDIMER in the last 72 hours. Hemoglobin A1C No results for input(s): HGBA1C in the last 72 hours. Fasting Lipid Panel Recent Labs    05/06/21 1940  CHOL 144  HDL 27*  LDLCALC 97  TRIG 102  CHOLHDL 5.3   Thyroid Function Tests Recent Labs    05/07/21 0231  TSH 2.149    Other results:   Imaging    DG Chest 2 View  Result Date: 05/06/2021 CLINICAL DATA:  Chest pain EXAM: CHEST - 2 VIEW COMPARISON:  06/20/2019 FINDINGS: Persistent or recurrent right pleural effusion with adjacent atelectasis. Probable mild pulmonary edema. No pneumothorax. Similar cardiomegaly. IMPRESSION: Persistent or recurrent moderate right pleural effusion with adjacent atelectasis. Cardiomegaly with probable mild pulmonary edema. Electronically Signed   By: Macy Mis M.D.   On: 05/06/2021 12:21   CT Angio Chest PE W  and/or Wo Contrast  Result Date: 05/06/2021 CLINICAL DATA:  Suspected pulmonary embolism in a 60 year old male EXAM: CT ANGIOGRAPHY CHEST WITH CONTRAST TECHNIQUE: Multidetector CT imaging of the chest was performed using the standard protocol during bolus administration of intravenous contrast. Multiplanar CT image reconstructions and MIPs were obtained to evaluate the vascular anatomy. CONTRAST:  71mL OMNIPAQUE IOHEXOL 350 MG/ML SOLN COMPARISON:  August 03, 2018. FINDINGS: Cardiovascular: Normal caliber of the thoracic aorta. No aneurysmal dilation. Heart size is enlarged similar to the prior study. No substantial pericardial effusion. Exam is optimized for evaluation of the pulmonary vascular bed. No central pulmonary embolism. Hypoperfusion of the RIGHT lower lobe vascular bed is again noted with chronic volume loss in the RIGHT lower lobe. Potential filling defect at the segmental level in the RIGHT lower lobe, difficult to assess due to variable contrast attenuation in this area. Subsegmental emboli in the LEFT lower lobe. Lingular and LEFT upper lobe segmental and subsegmental emboli. Mediastinum/Nodes: Thoracic inlet structures are grossly unremarkable. 1.6 cm subcarinal lymph node of uncertain significance. No axillary lymphadenopathy. Mild fullness of hilar nodal tissue unchanged. Patulous esophagus with some debris. Lungs/Pleura: RIGHT lower lobe volume loss and RIGHT pleural effusion is unchanged compared to previous imaging. Larger airways are patent. Upper Abdomen: Incidental imaging of upper abdominal contents with reflux of contrast into hepatic veins as before. Musculoskeletal: No acute musculoskeletal process. No destructive bone finding. Review of the MIP images confirms the above findings. IMPRESSION: 1. LEFT lower lobe emboli and LEFT upper lobe emboli segmental and subsegmental level. 2. Poor opacification of RIGHT pulmonary vascular bed in the lower lobe in the setting of chronic airspace  disease and effusion. (Similar to the prior study) difficult to exclude small embolus in this location potentially with segmental or subsegmental emboli. 3. Persistent global cardiac enlargement and signs of cardiac dysfunction with reflux of contrast into hepatic veins as on the previous study. Consider correlation with echocardiography as warranted. 4. Enlargement of subcarinal lymph nodes, potentially reactive. Given persistent airspace process in the RIGHT lung base and nodal enlargement would suggest pulmonology follow-up for further assessment and consideration of at a minimum three-month follow-up CT for further assessment. Critical Value/emergent results were called by telephone at the time of interpretation on 05/06/2021 at 3:20 pm to provider [facility] , who verbally acknowledged these results. Electronically Signed   By: Zetta Bills M.D.   On: 05/06/2021 15:32   VAS [place] LOWER EXTREMITY VENOUS (DVT) (ONLY [facility] & [facility] 7a-7p)  Result Date: 05/06/2021  Lower Venous DVT Study Patient Name:  Kurt Woodard  Date of Exam:   05/06/2021 Medical Rec #: [id number]      Accession #:    [id number] Date of Birth: [birth year]     Patient Gender: M Patient Age:   59Y Exam Location:  [facility] Procedure:      VAS [place] LOWER EXTREMITY VENOUS (DVT) Referring Phys: [id number] Gordon --------------------------------------------------------------------------------  Indications: Edema.  Comparison Study: Exam at [facility] 02/01/18 - extensive DVT to LLE                   from"iliac to ankle" per report Performing Technologist: Rogelia Rohrer  Examination Guidelines: A complete evaluation includes B-mode imaging, spectral Doppler, color Doppler, and power Doppler as needed of all accessible portions of each vessel. Bilateral testing is considered an integral part of a complete examination. Limited examinations for reoccurring indications may be performed as noted. The reflux portion of the exam is performed  with the patient in reverse Trendelenburg.  +---------+---------------+---------+-----------+----------+--------------+ RIGHT    CompressibilityPhasicitySpontaneityPropertiesThrombus Aging +---------+---------------+---------+-----------+----------+--------------+ CFV      Full           Yes      Yes                                 +---------+---------------+---------+-----------+----------+--------------+ SFJ      Full                                                        +---------+---------------+---------+-----------+----------+--------------+ FV Prox  Full           Yes      Yes                                 +---------+---------------+---------+-----------+----------+--------------+ FV Mid   Full           Yes      Yes                                 +---------+---------------+---------+-----------+----------+--------------+ FV DistalFull           Yes      Yes                                 +---------+---------------+---------+-----------+----------+--------------+ PFV      Full                                                        +---------+---------------+---------+-----------+----------+--------------+ POP      Full           Yes      Yes                                 +---------+---------------+---------+-----------+----------+--------------+ PTV      Full                                                        +---------+---------------+---------+-----------+----------+--------------+  PERO     Full                                                        +---------+---------------+---------+-----------+----------+--------------+   +---------+---------------+---------+-----------+----------+--------------+ LEFT     CompressibilityPhasicitySpontaneityPropertiesThrombus Aging +---------+---------------+---------+-----------+----------+--------------+ CFV      Full           Yes      Yes                                  +---------+---------------+---------+-----------+----------+--------------+ SFJ      Full                                                        +---------+---------------+---------+-----------+----------+--------------+ FV Prox  Full           Yes      Yes                                 +---------+---------------+---------+-----------+----------+--------------+ FV Mid   Full           Yes      Yes                                 +---------+---------------+---------+-----------+----------+--------------+ FV DistalFull           Yes      Yes                                 +---------+---------------+---------+-----------+----------+--------------+ PFV      Full                                                        +---------+---------------+---------+-----------+----------+--------------+ POP      Full           Yes      Yes                                 +---------+---------------+---------+-----------+----------+--------------+ PTV      Full                                                        +---------+---------------+---------+-----------+----------+--------------+ PERO     Full                                                        +---------+---------------+---------+-----------+----------+--------------+  Summary: BILATERAL: - No evidence of deep vein thrombosis seen in the lower extremities, bilaterally. - No evidence of superficial venous thrombosis in the lower extremities, bilaterally. -No evidence of popliteal cyst, bilaterally. -Subcutaneous edema in BLE from knee to ankle.  *See table(s) above for measurements and observations. Electronically signed by Harold Barban MD on 05/06/2021 at 9:22:36 PM.    Final       Medications:     Scheduled Medications: . digoxin  0.125 mg Oral Daily  . furosemide  80 mg Intravenous BID  . insulin aspart  0-15 Units Subcutaneous TID WC  . sacubitril-valsartan  1 tablet Oral BID  . spironolactone   12.5 mg Oral Daily     Infusions: . heparin 1,900 Units/hr (05/07/21 8546)     PRN Medications:  acetaminophen **OR** acetaminophen, polyethylene glycol     Assessment/Plan   1. Acute on chronic systolic HF with R>>L symptoms -Suspect HTN CM but also at risk for iCM - Echo 2020  EF 20% markedly dilated. RV moderately down  - Cath arranged but did not f/u  - Bedside echo on admit EF 20% RV dilated and severely HK - He has surprisingly mild symptoms (R>L) in the face of severe biventricular cardiomyopathy but remains quite tachycardic - Continue IV Lasix 80 mg bid  - Continue Entresto 49-51 bid - Increase Spiro to 25 daily - Continue digoxin 0.125 mg daily  - eventual R/L cath+/- cMRI prior to d/c - TTE in process   2. HTN, uncontrolled - better this am, titrate GDMT as above  3. Acute RLL subsegmental PE - heparin for now - u/s LEs  negative for DVT  4. Large R pleural effusion - likely need thora if not improving with diuresis   5. 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 Elevated blood glucose - likely new DM2 diagnosis.  - per FTPS.  HgBa1c pending   7. Elevated bilirubin - suspect related to RV failure   Length of Stay: [place], PA-C  05/07/2021, 9:21 AM  Advanced Heart Failure Team Pager [phone] (M-F; 7a - 5p)  Please contact [facility] for night-coverage after hours (5p -7a ) and weekends on [email or web address]   Patient seen and examined with the above-signed Advanced Practice Provider and/or Housestaff. I personally reviewed laboratory data, imaging studies and relevant notes. I independently examined the patient and formulated the important aspects of the plan. I have edited the note to reflect any of my changes or salient points. I have personally discussed the plan with the patient and/or family.  Not much response to lasix. SCr slightly higher. Denies SOB, orthopnea or PND.    General:  Sitting up in bed  No resp difficulty HEENT: normal Neck: supple. JVP 9-10. Carotids 2+ bilat; no bruits. No lymphadenopathy or thryomegaly appreciated. Cor: PMI nondisplaced. Regular rate & rhythm. No rubs, gallops or murmurs. Lungs: clear Abdomen: soft, nontender, nondistended. No hepatosplenomegaly. No bruits or masses. Good bowel sounds. Extremities: no cyanosis, clubbing, rash, 1+ edema Neuro: alert & orientedx3, cranial nerves grossly intact. moves all 4 extremities w/o difficulty. Affect pleasant  Would hold IV lasix today. Paln R/L cath tomorrow if SCr < 2.0 (otherwise just RHC). Hold spiro as well. Would ask pulmoanry to see to evaluate R pleural effusion.   Continue heparin.   Glori Bickers, MD  11:35 AM

## 2021-05-07 NOTE — Progress Notes (Signed)
Family Medicine Teaching Service Daily Progress Note Intern Pager: [phone]  Patient name: Kurt Woodard Medical record number: [id number] Date of birth: 09-29-1961 Age: 60 y.o. Gender: male  Primary Care Provider: Patient, No Pcp Per (Inactive) Consultants: Cardiology Code Status: FULL  Pt Overview and Major Events to Date:  5/25: Admitted; Dx with PE, started on heparin gtt  Assessment and Plan: Kurt Woodard is a 60 y.o. male presenting with left-sided chest pain since this morning, found to have segmental and subsegmental pulmonary emboli. Reported PMH is only significant for CHF, previous DVT.   Segmental and Subsegmental Pulmonary Emboli Patient started on Heparin gtt yesterday. Bedside echo performed by Cardiologist yesterday.  Continues to be hemodynamically stable and saturating well on room air. PE likely provoked in the setting of being sedentary but still needs outpatient age-appropriate cancer screening.  -Continue Heparin gtt  -Keep O2 saturations >90%  Acute on Chronic CHF exacerbation Bedside echo performed by Dr. Haroldine Laws yesterday demonstrated EF of 20%, RV dilated. Started on GDMT. IV Lasix increased to 80 mg BID. Only 975cc recorded output and weight stable. Unsure of patients dry weight. Eventual R/L cath +/- cMRI prior to d/c. -Cardiology following, appreciate care and consultation -Continue Digoxin, Entresto, Aldactone  -IV Lasix 80 mg BID -Strict I/O -Daily weights   AKI on CKD stage 3b Creatinine 1.83>1.9. GFR 42>40. On IV diuresis. -Daily BMP  Large R Pleural Effusion Appears chronic. IV diuresis as above. May need thoracentesis if does not improve with diuresis.  T2DM, not medication controlled With random BG >200 yesterday, patient does have diabetes. Hgb A1c pending. -CBG AC/qHS -mSSI  Elevated Bilirubin Total bili 2.7> 2.4.  Could be related to RV failure.  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  -Refer to outpatient derm/general surgery  for removal   Enlarged subcarinal lymph nodes Seen on CTA on admission.  Radiology recommending repeat CT in 3 months and outpatient referral to pulmonology for further assessment.  Social barriers -TOC consult has been placed for medication assistance, PCP needs  Hypokalemia: Resolved  Potassium 3.7 this AM. Will continue to monitor as patient is being diuresed and replete as necessary.  -Monitor with BMP  FEN/GI: Heart healthy  PPx: Heparin gtt    Status is: Observation  The patient will require care spanning > 2 midnights and should be moved to inpatient because: Ongoing diagnostic testing needed not appropriate for outpatient work up, IV treatments appropriate due to intensity of illness or inability to take PO and Inpatient level of care appropriate due to severity of illness  Dispo: The patient is from: Home              Anticipated d/c is to: Home              Patient currently is not medically stable to d/c.   Difficult to place patient No    Subjective:  Patient reports feeling much better today.  States that he is ready to go home today as he does not enjoy being in the hospital.  He feels that the chest pain has resolved.  He denies any shortness of breath.    Objective: Temp:  [97.8 F (36.6 C)-98.8 F (37.1 C)] 98.8 F (37.1 C) (05/26 0543) Pulse Rate:  [95-117] 95 (05/26 0543) Resp:  [16-31] 20 (05/26 0543) BP: (115-152)/(93-123) 115/93 (05/26 0543) SpO2:  [93 %-100 %] 98 % (05/26 0543) Weight:  [94.8 kg-94.9 kg] 94.9 kg (05/26 0046) Physical Exam: General: Awake, alert, pleasant, no distress  Cardiovascular: RRR, without murmur, JVD noted Respiratory: Decreased lung sounds bilaterally though right> left Extremities: 1+ pitting edema left lower extremity and 2+ pitting edema right lower extremity to knee, chronic skin changes to lower extremities  Laboratory: Recent Labs  Lab 05/06/21 1127 05/07/21 0231  WBC 10.0 8.2  HGB 15.2 14.6  HCT 48.6 46.4   PLT 197 198   Recent Labs  Lab 05/06/21 1127 05/07/21 0231  NA 135 135  K 3.0* 3.7  CL 97* 97*  CO2 26 27  BUN 18 19  CREATININE 1.83* 1.90*  CALCIUM 8.6* 8.4*  PROT 6.7 6.4*  BILITOT 2.7* 2.4*  ALKPHOS 80 86  ALT 16 16  AST 16 18  GLUCOSE 327* 232*     Imaging/Diagnostic Tests: DG Chest 2 View  Result Date: 05/06/2021 CLINICAL DATA:  Chest pain EXAM: CHEST - 2 VIEW COMPARISON:  06/20/2019 FINDINGS: Persistent or recurrent right pleural effusion with adjacent atelectasis. Probable mild pulmonary edema. No pneumothorax. Similar cardiomegaly. IMPRESSION: Persistent or recurrent moderate right pleural effusion with adjacent atelectasis. Cardiomegaly with probable mild pulmonary edema. Electronically Signed   By: Macy Mis M.D.   On: 05/06/2021 12:21   CT Angio Chest PE W and/or Wo Contrast  Result Date: 05/06/2021 CLINICAL DATA:  Suspected pulmonary embolism in a 60 year old male EXAM: CT ANGIOGRAPHY CHEST WITH CONTRAST TECHNIQUE: Multidetector CT imaging of the chest was performed using the standard protocol during bolus administration of intravenous contrast. Multiplanar CT image reconstructions and MIPs were obtained to evaluate the vascular anatomy. CONTRAST:  63mL OMNIPAQUE IOHEXOL 350 MG/ML SOLN COMPARISON:  August 03, 2018. FINDINGS: Cardiovascular: Normal caliber of the thoracic aorta. No aneurysmal dilation. Heart size is enlarged similar to the prior study. No substantial pericardial effusion. Exam is optimized for evaluation of the pulmonary vascular bed. No central pulmonary embolism. Hypoperfusion of the RIGHT lower lobe vascular bed is again noted with chronic volume loss in the RIGHT lower lobe. Potential filling defect at the segmental level in the RIGHT lower lobe, difficult to assess due to variable contrast attenuation in this area. Subsegmental emboli in the LEFT lower lobe. Lingular and LEFT upper lobe segmental and subsegmental emboli. Mediastinum/Nodes:  Thoracic inlet structures are grossly unremarkable. 1.6 cm subcarinal lymph node of uncertain significance. No axillary lymphadenopathy. Mild fullness of hilar nodal tissue unchanged. Patulous esophagus with some debris. Lungs/Pleura: RIGHT lower lobe volume loss and RIGHT pleural effusion is unchanged compared to previous imaging. Larger airways are patent. Upper Abdomen: Incidental imaging of upper abdominal contents with reflux of contrast into hepatic veins as before. Musculoskeletal: No acute musculoskeletal process. No destructive bone finding. Review of the MIP images confirms the above findings. IMPRESSION: 1. LEFT lower lobe emboli and LEFT upper lobe emboli segmental and subsegmental level. 2. Poor opacification of RIGHT pulmonary vascular bed in the lower lobe in the setting of chronic airspace disease and effusion. (Similar to the prior study) difficult to exclude small embolus in this location potentially with segmental or subsegmental emboli. 3. Persistent global cardiac enlargement and signs of cardiac dysfunction with reflux of contrast into hepatic veins as on the previous study. Consider correlation with echocardiography as warranted. 4. Enlargement of subcarinal lymph nodes, potentially reactive. Given persistent airspace process in the RIGHT lung base and nodal enlargement would suggest pulmonology follow-up for further assessment and consideration of at a minimum three-month follow-up CT for further assessment. Critical Value/emergent results were called by telephone at the time of interpretation on 05/06/2021 at 3:20 pm to provider  LINDSEY LAYDEN , who verbally acknowledged these results. Electronically Signed   By: Zetta Bills M.D.   On: 05/06/2021 15:32   VAS [place] LOWER EXTREMITY VENOUS (DVT) (ONLY [facility] & [facility] 7a-7p)  Result Date: 05/06/2021  Lower Venous DVT Study Patient Name:  GARL SPEIGNER  Date of Exam:   05/06/2021 Medical Rec #: [id number]      Accession #:    [id number] Date of Birth:  11-08-1961     Patient Gender: M Patient Age:   59Y Exam Location:  [facility] Procedure:      VAS [place] LOWER EXTREMITY VENOUS (DVT) Referring Phys: [id number] [place] --------------------------------------------------------------------------------  Indications: Edema.  Comparison Study: Exam at [facility] 02/01/18 - extensive DVT to LLE                   from"iliac to ankle" per report Performing Technologist: Rogelia Rohrer  Examination Guidelines: A complete evaluation includes B-mode imaging, spectral Doppler, color Doppler, and power Doppler as needed of all accessible portions of each vessel. Bilateral testing is considered an integral part of a complete examination. Limited examinations for reoccurring indications may be performed as noted. The reflux portion of the exam is performed with the patient in reverse Trendelenburg.  +---------+---------------+---------+-----------+----------+--------------+ RIGHT    CompressibilityPhasicitySpontaneityPropertiesThrombus Aging +---------+---------------+---------+-----------+----------+--------------+ CFV      Full           Yes      Yes                                 +---------+---------------+---------+-----------+----------+--------------+ SFJ      Full                                                        +---------+---------------+---------+-----------+----------+--------------+ FV Prox  Full           Yes      Yes                                 +---------+---------------+---------+-----------+----------+--------------+ FV Mid   Full           Yes      Yes                                 +---------+---------------+---------+-----------+----------+--------------+ FV DistalFull           Yes      Yes                                 +---------+---------------+---------+-----------+----------+--------------+ PFV      Full                                                         +---------+---------------+---------+-----------+----------+--------------+ POP      Full           Yes      Yes                                 +---------+---------------+---------+-----------+----------+--------------+  PTV      Full                                                        +---------+---------------+---------+-----------+----------+--------------+ PERO     Full                                                        +---------+---------------+---------+-----------+----------+--------------+   +---------+---------------+---------+-----------+----------+--------------+ LEFT     CompressibilityPhasicitySpontaneityPropertiesThrombus Aging +---------+---------------+---------+-----------+----------+--------------+ CFV      Full           Yes      Yes                                 +---------+---------------+---------+-----------+----------+--------------+ SFJ      Full                                                        +---------+---------------+---------+-----------+----------+--------------+ FV Prox  Full           Yes      Yes                                 +---------+---------------+---------+-----------+----------+--------------+ FV Mid   Full           Yes      Yes                                 +---------+---------------+---------+-----------+----------+--------------+ FV DistalFull           Yes      Yes                                 +---------+---------------+---------+-----------+----------+--------------+ PFV      Full                                                        +---------+---------------+---------+-----------+----------+--------------+ POP      Full           Yes      Yes                                 +---------+---------------+---------+-----------+----------+--------------+ PTV      Full                                                         +---------+---------------+---------+-----------+----------+--------------+  PERO     Full                                                        +---------+---------------+---------+-----------+----------+--------------+     Summary: BILATERAL: - No evidence of deep vein thrombosis seen in the lower extremities, bilaterally. - No evidence of superficial venous thrombosis in the lower extremities, bilaterally. -No evidence of popliteal cyst, bilaterally. -Subcutaneous edema in BLE from knee to ankle.  *See table(s) above for measurements and observations. Electronically signed by Harold Barban MD on 05/06/2021 at 9:22:36 PM.    Final      Sharion Settler, DO 05/07/2021, 6:10 AM PGY-1, [place] Intern pager: [phone], text pages welcome

## 2021-05-07 NOTE — Care Management (Signed)
1409 05-07-21 Case Manager received a consult regarding primary care provider (PCP) and medication needs. Patient lives in [place] and is without a PCP. Patient is willing to drive to [facility] for PCP needs. Case Manager discussed the clinics and the patient was agreeable to the [place] is aware of the onsite pharmacy. Unit secretary to schedule an appointment and place on the AVS. Patient may need MATCH assistance once stable to transition home-patient will benefit from [place] once stable to transition home. Bethena Roys , RN,BSN Case Manager

## 2021-05-07 NOTE — Progress Notes (Signed)
ANTICOAGULATION CONSULT NOTE - Follow Up Consult  Pharmacy Consult for Heparin Indication: DVT and PE  No Known Allergies  Patient Measurements: Height: 5\' 10"  (177.8 cm) Weight: 94.9 kg (209 lb 3.5 oz) IBW/kg (Calculated) : 73 Heparin Dosing Weight: 92.3 kg  Vital Signs: Temp: 98.3 F (36.8 C) (05/26 1607) Temp Source: Oral (05/26 1607) BP: 120/89 (05/26 1607) Pulse Rate: 105 (05/26 1607)  Labs: Recent Labs    05/06/21 1127 05/06/21 1327 05/07/21 0231 05/07/21 1000 05/07/21 1551  HGB 15.2  --  14.6  --   --   HCT 48.6  --  46.4  --   --   PLT 197  --  198  --   --   HEPARINUNFRC  --   --  0.18* 0.46 0.37  CREATININE 1.83*  --  1.90*  --   --   TROPONINIHS 59* 48*  --   --   --     Estimated Creatinine Clearance: 48.4 mL/min (A) (by C-G formula based on SCr of 1.9 mg/dL (H)).   Assessment:  Anticoag: Acute PE + DVT - started on IV heparin  - Hep level 0.46> 0.37 remains in goal. - Thoracentesis 5/26 PM 1152ml for pleural effusion  Goal of Therapy:  Heparin level 0.3-0.7 units/ml Monitor platelets by anticoagulation protocol: Yes   Plan:  Heparin gtt at 1900 units/hr.  Daily HL and CBC   Hillery Zachman S. [place] Highland, PharmD, BCPS Clinical Staff Pharmacist [email or web address] Alford Highland, [employer] 05/07/2021,5:20 PM

## 2021-05-07 NOTE — Progress Notes (Addendum)
Family Medicine Teaching Service Daily Progress Note Intern Pager: [phone]  Patient name: Kurt Woodard Medical record number: [id number] Date of birth: 08-28-1961 Age: 60 y.o. Gender: male  Primary Care Provider: Patient, No Pcp Per (Inactive) Consultants: Cardiology, Pulmonology (s/o) Code Status: FULL  Pt Overview and Major Events to Date:  5/25: Admitted, started on heparin gtt for PE  5/26: Thoracentesis with 1100 cc of serosanguinous fluid drained from right pleural space  Assessment and Plan: Kurt Oxendineis a 60 y.o.male who presented with left-sidedchestpain,found to have segmental and subsegmental pulmonary emboli.ReportedPMH is onlysignificant for CHF, previous DVT.  Segmental and Subsegmental Pulmonary Emboli Continues on heparin gtt. Continues to be hemodynamically stable, though intermittently tachycardic (low 100's), and asymptomatic. Stable on room air with saturations >95% consistently. Can consider switch to NOAC after upcoming procedures (heart cath).  -Continue to monitor O2, keep sats >90% -Continue heparin gtt -Switch to NOAC when able  Acute on Chronic HFrEF  Formal echo performed yesterday which showed EF 10-15% with global hypokinesis, moderately elevated pulmonary pressures, moderate to severe mitral and tricuspid regurg and dilated IVC. Undergoing R/L heart catheterization today. -Cardiology following  -R/L heart cath today  -Holding Spironolactone and Lasix  -Continue entresto and digoxin  -Strict I/O, daily weights -Consider starting Jardiance   Right Chronic Pleural Effusion s/p Thoracentesis Thoracentesis performed yesterday with 1100cc of serosanguinous fluid drained. Mesothelial cells appreciated. Unfortunately don't have serum albumin or LDH to calculate lights criteria. Pulmonology feels this is most likely not sinister cause due to the chronicity and stability of the effusion and expect this is more-so from asymmetric chronic volume  overload in the setting of mitral valve regurgitation.  -F/u fluid culture -F/u cytology   AKI on CKD stage 3b: Improving Creatinine 1.83>1.9>1.56. GFR 40>51. Improving off of IV diuresis. Unclear baseline but creatinine 1.18 in 2020.  -Daily BMP  T2DM: poorly controlled  Hgb A1c 11.6. CBG's ranging 183-249 yesterday. Required 21U of SSI.  -CBG AC/qHS -Start 10U Lantus -mSSI  -Consider starting Jardiance  ? Lipoma Chronic. Recommend follow up for removal outpatient with either dermatology or general surgery.   Social Barriers TOC consulted for PCP needs and financial assistance. SW saw patient yesterday. Unit secretary to coodinate appointment with [facility]. Appreciate SW assistance.  -MATCH assistance for discharge medications -TOC for discharge medications   FEN/GI: NPO for heart cath and then heart healthy/carb modified PPx: Heparin gtt   Status is: Inpatient  Remains inpatient appropriate because:Ongoing diagnostic testing needed not appropriate for outpatient work up   Dispo: The patient is from: Home              Anticipated d/c is to: Home              Patient currently is not medically stable to d/c.   Difficult to place patient No    Subjective:  Patient states that he feels well.  He does not have any symptomatic difference after having the 1100 cc drained from his pleural effusion.  States that the left-sided chest pain he was experiencing has resolved.  He understands that he is going to be undergoing a heart cath today to better assess his heart.  Patient understands the importance of medications but at the same time notes that he is not one to like to take a lot of medications.  He really has felt benefit from the [place] but, otherwise, is not quite interested in continuing medications after hospitalization.  He did acknowledge that  financial barriers or reason that he was lost to follow-up.  He is thankful for the resources he  will be getting moving forward.  Objective: Temp:  [97.8 F (36.6 C)-98.7 F (37.1 C)] 98.2 F (36.8 C) (05/27 0746) Pulse Rate:  [93-110] 93 (05/27 0746) Resp:  [16-18] 17 (05/27 0746) BP: (101-124)/(69-94) 113/76 (05/27 0746) SpO2:  [96 %-100 %] 98 % (05/27 0746) Weight:  [95.2 kg] 95.2 kg (05/27 0230) Physical Exam: General: Awake, alert, sitting in chair watching TV, no distress, pleasant Cardiovascular: RRR, without murmur Respiratory: Improved air movement in right lung, breathing comfortably without respiratory distress Extremities: 2+ pitting edema bilaterally up to knees  Laboratory: Recent Labs  Lab 05/06/21 1127 05/07/21 0231 05/08/21 0353  WBC 10.0 8.2 12.7*  HGB 15.2 14.6 13.9  HCT 48.6 46.4 43.4  PLT 197 198 168   Recent Labs  Lab 05/06/21 1127 05/07/21 0231 05/08/21 0353  NA 135 135 133*  K 3.0* 3.7 3.6  CL 97* 97* 101  CO2 26 27 23   BUN 18 19 26*  CREATININE 1.83* 1.90* 1.56*  CALCIUM 8.6* 8.4* 8.4*  PROT 6.7 6.4*  --   BILITOT 2.7* 2.4*  --   ALKPHOS 80 86  --   ALT 16 16  --   AST 16 18  --   GLUCOSE 327* 232* 113*     Imaging/Diagnostic Tests: DG CHEST PORT 1 VIEW  Result Date: 05/07/2021 CLINICAL DATA:  Status post thoracentesis. EXAM: PORTABLE CHEST 1 VIEW COMPARISON:  05/06/2021 FINDINGS: Marked reduction in right pleural effusion without evidence for pneumothorax after thoracentesis. Persistent consolidative opacity noted at the right base. The cardio pericardial silhouette is enlarged. The visualized bony structures of the thorax show no acute abnormality. Telemetry leads overlie the chest. IMPRESSION: Marked reduction in right pleural effusion after thoracentesis. No pneumothorax. Electronically Signed   By: Misty Stanley M.D.   On: 05/07/2021 17:20   ECHOCARDIOGRAM COMPLETE  Result Date: 05/07/2021    ECHOCARDIOGRAM REPORT   Patient Name:   Kurt Woodard Date of Exam: 05/07/2021 Medical Rec #:  [id number]     Height:       70.0 in  Accession #:    [id number]    Weight:       209.2 lb Date of Birth:  [birth year]    BSA:          2.128 m Patient Age:    75 years      BP:           115/93 mmHg Patient Gender: M             HR:           95 bpm. Exam Location:  Inpatient Procedure: 2D Echo, Cardiac Doppler and Color Doppler Indications:    Pulmonary embolus  History:        Patient has no prior history of Echocardiogram examinations.                 CHF.  Sonographer:    Cammy Brochure Referring Phys: [place]  1. No LV thrombus. Severely reduce LVEF, 10-15%. LVOT VTI 5.8 cm which equates to SV 18 cc and cardiac output 1.8 L/min/m2. Left ventricular ejection fraction, by estimation, is 10-15%. The left ventricle has severely decreased function. The left ventricle demonstrates global hypokinesis. The left ventricular internal cavity size was moderately to severely dilated. Indeterminate diastolic filling due to E-A fusion.  2. Right ventricular systolic function is  severely reduced. The right ventricular size is severely enlarged. There is moderately elevated pulmonary artery systolic pressure. The estimated right ventricular systolic pressure is 01.7 mmHg.  3. Left atrial size was mildly dilated.  4. Right atrial size was mild to moderately dilated.  5. The mitral valve is grossly normal. Moderate to severe mitral valve regurgitation. No evidence of mitral stenosis.  6. Tricuspid valve regurgitation is moderate to severe.  7. The aortic valve is tricuspid. Aortic valve regurgitation is mild. No aortic stenosis is present.  8. The inferior vena cava is dilated in size with <50% respiratory variability, suggesting right atrial pressure of 15 mmHg. Conclusion(s)/Recommendation(s): No left ventricular mural or apical thrombus/thrombi. FINDINGS  Left Ventricle: No LV thrombus. Severely reduce LVEF, 10-15%. LVOT VTI 5.8 cm which equates to SV 18 cc and cardiac output 1.8 L/min/m2. Left ventricular ejection fraction, by  estimation, is 10-15%. The left ventricle has severely decreased function. The left ventricle demonstrates global hypokinesis. The left ventricular internal cavity size was moderately to severely dilated. There is no left ventricular hypertrophy. Indeterminate diastolic filling due to E-A fusion. Right Ventricle: The right ventricular size is severely enlarged. No increase in right ventricular wall thickness. Right ventricular systolic function is severely reduced. There is moderately elevated pulmonary artery systolic pressure. The tricuspid regurgitant velocity is 3.09 m/s, and with an assumed right atrial pressure of 15 mmHg, the estimated right ventricular systolic pressure is 51.0 mmHg. Left Atrium: Left atrial size was mildly dilated. Right Atrium: Right atrial size was mild to moderately dilated. Pericardium: Trivial pericardial effusion is present. Mitral Valve: The mitral valve is grossly normal. Moderate to severe mitral valve regurgitation. No evidence of mitral valve stenosis. Tricuspid Valve: The tricuspid valve is grossly normal. Tricuspid valve regurgitation is moderate to severe. No evidence of tricuspid stenosis. Aortic Valve: The aortic valve is tricuspid. Aortic valve regurgitation is mild. Aortic regurgitation PHT measures 422 msec. No aortic stenosis is present. Aortic valve mean gradient measures 1.0 mmHg. Aortic valve peak gradient measures 1.8 mmHg. Aortic  valve area, by VTI measures 2.57 cm. Pulmonic Valve: The pulmonic valve was grossly normal. Pulmonic valve regurgitation is not visualized. No evidence of pulmonic stenosis. Aorta: The aortic root and ascending aorta are structurally normal, with no evidence of dilitation. Venous: The inferior vena cava is dilated in size with less than 50% respiratory variability, suggesting right atrial pressure of 15 mmHg. IAS/Shunts: The atrial septum is grossly normal.  LEFT VENTRICLE PLAX 2D LVIDd:         6.60 cm  Diastology LVIDs:         6.00 cm   LV e' lateral:   4.95 cm/s LV PW:         0.90 cm  LV E/e' lateral: 16.7 LV IVS:        1.30 cm LVOT diam:     2.00 cm LV SV:         18 LV SV Index:   8 LVOT Area:     3.14 cm  RIGHT VENTRICLE            IVC RV Basal diam:  5.30 cm    IVC diam: 2.20 cm RV S prime:     5.43 cm/s LEFT ATRIUM             Index       RIGHT ATRIUM           Index LA diam:        4.30  cm 2.02 cm/m  RA Area:     22.45 cm LA Vol (A2C):   58.3 ml 27.40 ml/m RA Volume:   78.70 ml  36.99 ml/m LA Vol (A4C):   66.5 ml 31.25 ml/m LA Biplane Vol: 66.5 ml 31.25 ml/m  AORTIC VALVE AV Area (Vmax):    1.94 cm AV Area (Vmean):   1.82 cm AV Area (VTI):     2.57 cm AV Vmax:           67.30 cm/s AV Vmean:          42.900 cm/s AV VTI:            0.069 m AV Peak Grad:      1.8 mmHg AV Mean Grad:      1.0 mmHg LVOT Vmax:         41.60 cm/s LVOT Vmean:        24.900 cm/s LVOT VTI:          0.056 m LVOT/AV VTI ratio: 0.82 AI PHT:            422 msec  AORTA Ao Root diam: 3.00 cm Ao Asc diam:  3.10 cm MITRAL VALVE               TRICUSPID VALVE MV Area (PHT): 5.02 cm    TR Peak grad:   38.2 mmHg MV Decel Time: 151 msec    TR Vmax:        309.00 cm/s MV E velocity: 82.70 cm/s                            SHUNTS                            Systemic VTI:  0.06 m                            Systemic Diam: 2.00 cm Eleonore Chiquito MD Electronically signed by Eleonore Chiquito MD Signature Date/Time: 05/07/2021/11:47:09 AM    Final      Sharion Settler, DO 05/08/2021, 9:05 AM PGY-1, [facility] Intern pager: [phone], text pages welcome  [facility] PAGER [phone] for any questions or notifications regarding this patient   FMTS Attending Daily Note: Dorcas Mcmurray MD  Attending pager:[phone]  office [phone]  I  have seen and examined this patient, reviewed their chart. I have discussed this patient with the resident. I agree with the resident's findings, assessment and care plan.  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  RPDA lesion is 50% stenosed.  1st Diag lesion is 20% stenosed.  Dist LAD lesion is 20% stenosed.  Cardiology reported hemodynamics were better than expected. They plan cardiac MRI this evening. Discontinue heparin gtt and start [place] for pulmonary emboli.

## 2021-05-07 NOTE — Progress Notes (Addendum)
FPTS Interim Progress Note  --Received a phone call from Dr. Haroldine Laws from cardiology about this patient needing pulmonology consult for chronic pleural effusion as the plan is to do cath on him tomorrow --Pulmonology consulted immediately and had conversation with Kurt Gens NP, and she is explained about patient's ongoing situation of coming with left-sided chest pain and found to have PE and on IV heparin but has chronic pleural effusion and would require pulmonary checkup before having his cath done tomorrow. -- She confirmed that pulmonolgy is going to see this patient.  Honor Junes, MD 05/07/2021, 3:51 PM PGY-1, [place] Medicine Service pager [phone]

## 2021-05-07 NOTE — Progress Notes (Signed)
Inpatient Diabetes Program Recommendations  AACE/ADA: New Consensus Statement on Inpatient Glycemic Control (2015)  Target Ranges:  Prepandial:   less than 140 mg/dL      Peak postprandial:   less than 180 mg/dL (1-2 hours)      Critically ill patients:  140 - 180 mg/dL   Lab Results  Component Value Date   GLUCAP 207 (H) 05/07/2021    Review of Glycemic Control Results for Kurt Woodard, Kurt Woodard (MRN [id number]) as of 05/07/2021 12:53  Ref. Range 05/06/2021 20:45 05/07/2021 05:43 05/07/2021 11:30  Glucose-Capillary Latest Ref Range: 70 - 99 mg/dL 313 (H) 249 (H) 207 (H)   Diabetes history: DM 2 Outpatient Diabetes medications:  None Current orders for Inpatient glycemic control:  Novolog moderate tid with meals Inpatient Diabetes Program Recommendations:    Note that patient was diagnosed with DM in April of 2019 with A1C of 6.9%.  Patient was not started on PO meds at that time for DM.  May need medication started for DM based on patients pending A1C.  While in the hospital, consider adding low dose basal insulin such as Lantus 12 units daily.   Will follow.   Thanks  Adah Perl, RN, BC-ADM Inpatient Diabetes Coordinator Pager [phone] (8a-5p)

## 2021-05-07 NOTE — Procedures (Signed)
Thoracentesis  Procedure Note  Taiwo Fish  [id number]  1961/06/01  Date:05/07/21  Time:5:03 PM   Provider Performing:Jolyne Laye Jerilynn Mages Dewaine Oats   Procedure: Thoracentesis with imaging guidance (50518)  Indication(s) Pleural Effusion  Consent Risks of the procedure as well as the alternatives and risks of each were explained to the patient and/or caregiver.  Consent for the procedure was obtained and is signed in the bedside chart  Anesthesia Topical only with 1% lidocaine    Time Out Verified patient identification, verified procedure, site/side was marked, verified correct patient position, special equipment/implants available, medications/allergies/relevant history reviewed, required imaging and test results available.   Sterile Technique Maximal sterile technique including full sterile barrier drape, hand hygiene, sterile gown, sterile gloves, mask, hair covering, sterile ultrasound probe cover (if used).  Procedure Description Ultrasound was used to identify appropriate pleural anatomy for placement and overlying skin marked.  Area of drainage cleaned and draped in sterile fashion. Lidocaine was used to anesthetize the skin and subcutaneous tissue.  1100 cc's of serosanguinous appearing fluid was drained from the right pleural space. Catheter then removed and bandaid applied to site.   Complications/Tolerance None; patient tolerated the procedure well. Chest X-ray is ordered to confirm no post-procedural complication.   EBL Minimal   Specimen(s) Pleural fluid  Hayden Pedro, AGACNP-BC [facility]  PCCM Pgr: [phone]

## 2021-05-07 NOTE — Consult Note (Signed)
Name: Kurt Woodard MRN: [id number] DOB: 10-07-1961    ADMISSION DATE:  05/06/2021 CONSULTATION DATE:  05/07/2021  REFERRING MD :  Dr. Nori Riis   CHIEF COMPLAINT:  Pleural Effusion   BRIEF PATIENT DESCRIPTION: 60 year old male with right side pleural effusion  SIGNIFICANT EVENTS  5/25 > Admitted for Chest Pain  STUDIES:  CT PE Study 5/25 > 1. LEFT lower lobe emboli and LEFT upper lobe emboli segmental and subsegmental level. 2. Poor opacification of RIGHT pulmonary vascular bed in the lower lobe in the setting of chronic airspace disease and effusion. (Similar to the prior study) difficult to exclude small embolus in this location potentially with segmental or subsegmental emboli. 3. Persistent global cardiac enlargement and signs of cardiac dysfunction with reflux of contrast into hepatic veins as on the previous study. Consider correlation with echocardiography as warranted. 4. Enlargement of subcarinal lymph nodes, potentially reactive. Given persistent airspace process in the RIGHT lung base and nodal enlargement would suggest pulmonology follow-up for further assessment and consideration of at a minimum three-month follow-up CT for further assessment.  HISTORY OF PRESENT ILLNESS:  60 year old male with PMH of CHF, DVT, HTN, CKD stage 3, COVID. Presents to ED on 5/25 with left sided chest pain. Found to have subsegmental PE. Started on Heparin gtt. ECHO with EF 10-15% with severely decreased left ventriclar function and right ventricular systolic function with moderate to severe tricuspid regurgitation. Cardiology consulted with plans for right and left heart cath on 5/27. CT Chest significant for ongoing right pleural effusion. Pulmonary Consulted.   PAST MEDICAL HISTORY :   has a past medical history of CHF (congestive heart failure) ([place]), Chronic congestive heart failure ([place]) (08/27/2019), and Renal disorder.  has no past surgical history on file. Prior to Admission  medications   Medication Sig Start Date End Date Taking? Authorizing Provider  carvedilol (COREG) 3.125 MG tablet TAKE 1 TABLET (3.125 MG TOTAL) BY MOUTH 2 (TWO) TIMES DAILY. PLEASE CALL FOR OFFICE VISIT [phone] 09/30/20 09/30/21 Yes Bensimhon, Shaune Pascal, MD  digoxin (LANOXIN) 0.125 MG tablet Take 1 tablet (0.125 mg total) by mouth daily. 04/24/21 04/24/22 Yes Bensimhon, Shaune Pascal, MD  furosemide (LASIX) 40 MG tablet Take 1 tablet (40 mg total) by mouth 2 (two) times daily. 04/24/21  Yes Bensimhon, Shaune Pascal, MD  guaiFENesin (MUCINEX) 600 MG 12 hr tablet Take 1 tablet (600 mg total) by mouth 2 (two) times daily for 10 days. 04/30/21 05/10/21 Yes Lamptey, Myrene Galas, MD  sacubitril-valsartan (ENTRESTO) 49-51 MG Take 1 tablet by mouth 2 (two) times daily. Must keep further appointments for refills 04/24/21  Yes Bensimhon, Shaune Pascal, MD   No Known Allergies  FAMILY HISTORY:  family history is not on file. SOCIAL HISTORY:  reports that he has never smoked. He has never used smokeless tobacco.  REVIEW OF SYSTEMS:   Constitutional: Negative for fever, chills, weight loss, malaise/fatigue and diaphoresis.  HENT: Negative for hearing loss, ear pain, nosebleeds, congestion, sore throat, neck pain, tinnitus and ear discharge.   Eyes: Negative for blurred vision, double vision, photophobia, pain, discharge and redness.  Respiratory: Negative for cough, hemoptysis, sputum production, shortness of breath, wheezing and stridor.   Cardiovascular: Negative for chest pain, palpitations, orthopnea, claudication, leg swelling and PND.  Gastrointestinal: Negative for heartburn, nausea, vomiting, abdominal pain, diarrhea, constipation, blood in stool and melena.  Genitourinary: Negative for dysuria, urgency, frequency, hematuria and flank pain.  Musculoskeletal: Negative for myalgias, back pain, joint pain and falls.  Skin: Negative for itching and rash.  Neurological: Negative for dizziness, tingling, tremors, sensory  change, speech change, focal weakness, seizures, loss of consciousness, weakness and headaches.  Endo/Heme/Allergies: Negative for environmental allergies and polydipsia. Does not bruise/bleed easily.  SUBJECTIVE:   VITAL SIGNS: Temp:  [98 F (36.7 C)-98.8 F (37.1 C)] 98.3 F (36.8 C) (05/26 1607) Pulse Rate:  [95-115] 105 (05/26 1607) Resp:  [16-25] 17 (05/26 1607) BP: (109-152)/(81-123) 120/89 (05/26 1607) SpO2:  [96 %-100 %] 100 % (05/26 1607) Weight:  [94.8 kg-94.9 kg] 94.9 kg (05/26 0500)  PHYSICAL EXAMINATION: General:  Adult male, sitting in chair, no distress  Neuro:  Alert, oriented, follows commands  HEENT:  MMM Cardiovascular:  RRR, HR 99, no MRG Lungs:  Diminished breath sounds to right, otherwise clear, no use of accessory muscles  Abdomen:  Obese, soft, non-tender Musculoskeletal:  +2 pedal edema Skin:  Intact   Recent Labs  Lab 05/06/21 1127 05/07/21 0231  NA 135 135  K 3.0* 3.7  CL 97* 97*  CO2 26 27  BUN 18 19  CREATININE 1.83* 1.90*  GLUCOSE 327* 232*   Recent Labs  Lab 05/06/21 1127 05/07/21 0231  HGB 15.2 14.6  HCT 48.6 46.4  WBC 10.0 8.2  PLT 197 198   DG Chest 2 View  Result Date: 05/06/2021 CLINICAL DATA:  Chest pain EXAM: CHEST - 2 VIEW COMPARISON:  06/20/2019 FINDINGS: Persistent or recurrent right pleural effusion with adjacent atelectasis. Probable mild pulmonary edema. No pneumothorax. Similar cardiomegaly. IMPRESSION: Persistent or recurrent moderate right pleural effusion with adjacent atelectasis. Cardiomegaly with probable mild pulmonary edema. Electronically Signed   By: Macy Mis M.D.   On: 05/06/2021 12:21   CT Angio Chest PE W and/or Wo Contrast  Result Date: 05/06/2021 CLINICAL DATA:  Suspected pulmonary embolism in a 60 year old male EXAM: CT ANGIOGRAPHY CHEST WITH CONTRAST TECHNIQUE: Multidetector CT imaging of the chest was performed using the standard protocol during bolus administration of intravenous contrast.  Multiplanar CT image reconstructions and MIPs were obtained to evaluate the vascular anatomy. CONTRAST:  60mL OMNIPAQUE IOHEXOL 350 MG/ML SOLN COMPARISON:  August 03, 2018. FINDINGS: Cardiovascular: Normal caliber of the thoracic aorta. No aneurysmal dilation. Heart size is enlarged similar to the prior study. No substantial pericardial effusion. Exam is optimized for evaluation of the pulmonary vascular bed. No central pulmonary embolism. Hypoperfusion of the RIGHT lower lobe vascular bed is again noted with chronic volume loss in the RIGHT lower lobe. Potential filling defect at the segmental level in the RIGHT lower lobe, difficult to assess due to variable contrast attenuation in this area. Subsegmental emboli in the LEFT lower lobe. Lingular and LEFT upper lobe segmental and subsegmental emboli. Mediastinum/Nodes: Thoracic inlet structures are grossly unremarkable. 1.6 cm subcarinal lymph node of uncertain significance. No axillary lymphadenopathy. Mild fullness of hilar nodal tissue unchanged. Patulous esophagus with some debris. Lungs/Pleura: RIGHT lower lobe volume loss and RIGHT pleural effusion is unchanged compared to previous imaging. Larger airways are patent. Upper Abdomen: Incidental imaging of upper abdominal contents with reflux of contrast into hepatic veins as before. Musculoskeletal: No acute musculoskeletal process. No destructive bone finding. Review of the MIP images confirms the above findings. IMPRESSION: 1. LEFT lower lobe emboli and LEFT upper lobe emboli segmental and subsegmental level. 2. Poor opacification of RIGHT pulmonary vascular bed in the lower lobe in the setting of chronic airspace disease and effusion. (Similar to the prior study) difficult to exclude small embolus in this location potentially with segmental  or subsegmental emboli. 3. Persistent global cardiac enlargement and signs of cardiac dysfunction with reflux of contrast into hepatic veins as on the previous study.  Consider correlation with echocardiography as warranted. 4. Enlargement of subcarinal lymph nodes, potentially reactive. Given persistent airspace process in the RIGHT lung base and nodal enlargement would suggest pulmonology follow-up for further assessment and consideration of at a minimum three-month follow-up CT for further assessment. Critical Value/emergent results were called by telephone at the time of interpretation on 05/06/2021 at 3:20 pm to provider [facility] , who verbally acknowledged these results. Electronically Signed   By: Zetta Bills M.D.   On: 05/06/2021 15:32   ECHOCARDIOGRAM COMPLETE  Result Date: 05/07/2021    ECHOCARDIOGRAM REPORT   Patient Name:   WASEEM SUESS Date of Exam: 05/07/2021 Medical Rec #:  [id number]     Height:       70.0 in Accession #:    [id number]    Weight:       209.2 lb Date of Birth:  11-05-1961    BSA:          2.128 m Patient Age:    63 years      BP:           115/93 mmHg Patient Gender: M             HR:           95 bpm. Exam Location:  Inpatient Procedure: 2D Echo, Cardiac Doppler and Color Doppler Indications:    Pulmonary embolus  History:        Patient has no prior history of Echocardiogram examinations.                 CHF.  Sonographer:    Cammy Brochure Referring Phys: [place]  1. No LV thrombus. Severely reduce LVEF, 10-15%. LVOT VTI 5.8 cm which equates to SV 18 cc and cardiac output 1.8 L/min/m2. Left ventricular ejection fraction, by estimation, is 10-15%. The left ventricle has severely decreased function. The left ventricle demonstrates global hypokinesis. The left ventricular internal cavity size was moderately to severely dilated. Indeterminate diastolic filling due to E-A fusion.  2. Right ventricular systolic function is severely reduced. The right ventricular size is severely enlarged. There is moderately elevated pulmonary artery systolic pressure. The estimated right ventricular systolic pressure is 82.9  mmHg.  3. Left atrial size was mildly dilated.  4. Right atrial size was mild to moderately dilated.  5. The mitral valve is grossly normal. Moderate to severe mitral valve regurgitation. No evidence of mitral stenosis.  6. Tricuspid valve regurgitation is moderate to severe.  7. The aortic valve is tricuspid. Aortic valve regurgitation is mild. No aortic stenosis is present.  8. The inferior vena cava is dilated in size with <50% respiratory variability, suggesting right atrial pressure of 15 mmHg. Conclusion(s)/Recommendation(s): No left ventricular mural or apical thrombus/thrombi. FINDINGS  Left Ventricle: No LV thrombus. Severely reduce LVEF, 10-15%. LVOT VTI 5.8 cm which equates to SV 18 cc and cardiac output 1.8 L/min/m2. Left ventricular ejection fraction, by estimation, is 10-15%. The left ventricle has severely decreased function. The left ventricle demonstrates global hypokinesis. The left ventricular internal cavity size was moderately to severely dilated. There is no left ventricular hypertrophy. Indeterminate diastolic filling due to E-A fusion. Right Ventricle: The right ventricular size is severely enlarged. No increase in right ventricular wall thickness. Right ventricular systolic function is severely reduced. There is moderately elevated pulmonary  artery systolic pressure. The tricuspid regurgitant velocity is 3.09 m/s, and with an assumed right atrial pressure of 15 mmHg, the estimated right ventricular systolic pressure is 27.0 mmHg. Left Atrium: Left atrial size was mildly dilated. Right Atrium: Right atrial size was mild to moderately dilated. Pericardium: Trivial pericardial effusion is present. Mitral Valve: The mitral valve is grossly normal. Moderate to severe mitral valve regurgitation. No evidence of mitral valve stenosis. Tricuspid Valve: The tricuspid valve is grossly normal. Tricuspid valve regurgitation is moderate to severe. No evidence of tricuspid stenosis. Aortic Valve: The  aortic valve is tricuspid. Aortic valve regurgitation is mild. Aortic regurgitation PHT measures 422 msec. No aortic stenosis is present. Aortic valve mean gradient measures 1.0 mmHg. Aortic valve peak gradient measures 1.8 mmHg. Aortic  valve area, by VTI measures 2.57 cm. Pulmonic Valve: The pulmonic valve was grossly normal. Pulmonic valve regurgitation is not visualized. No evidence of pulmonic stenosis. Aorta: The aortic root and ascending aorta are structurally normal, with no evidence of dilitation. Venous: The inferior vena cava is dilated in size with less than 50% respiratory variability, suggesting right atrial pressure of 15 mmHg. IAS/Shunts: The atrial septum is grossly normal.  LEFT VENTRICLE PLAX 2D LVIDd:         6.60 cm  Diastology LVIDs:         6.00 cm  LV e' lateral:   4.95 cm/s LV PW:         0.90 cm  LV E/e' lateral: 16.7 LV IVS:        1.30 cm LVOT diam:     2.00 cm LV SV:         18 LV SV Index:   8 LVOT Area:     3.14 cm  RIGHT VENTRICLE            IVC RV Basal diam:  5.30 cm    IVC diam: 2.20 cm RV S prime:     5.43 cm/s LEFT ATRIUM             Index       RIGHT ATRIUM           Index LA diam:        4.30 cm 2.02 cm/m  RA Area:     22.45 cm LA Vol (A2C):   58.3 ml 27.40 ml/m RA Volume:   78.70 ml  36.99 ml/m LA Vol (A4C):   66.5 ml 31.25 ml/m LA Biplane Vol: 66.5 ml 31.25 ml/m  AORTIC VALVE AV Area (Vmax):    1.94 cm AV Area (Vmean):   1.82 cm AV Area (VTI):     2.57 cm AV Vmax:           67.30 cm/s AV Vmean:          42.900 cm/s AV VTI:            0.069 m AV Peak Grad:      1.8 mmHg AV Mean Grad:      1.0 mmHg LVOT Vmax:         41.60 cm/s LVOT Vmean:        24.900 cm/s LVOT VTI:          0.056 m LVOT/AV VTI ratio: 0.82 AI PHT:            422 msec  AORTA Ao Root diam: 3.00 cm Ao Asc diam:  3.10 cm MITRAL VALVE  TRICUSPID VALVE MV Area (PHT): 5.02 cm    TR Peak grad:   38.2 mmHg MV Decel Time: 151 msec    TR Vmax:        309.00 cm/s MV E velocity: 82.70 cm/s                             SHUNTS                            Systemic VTI:  0.06 m                            Systemic Diam: 2.00 cm Eleonore Chiquito MD Electronically signed by Eleonore Chiquito MD Signature Date/Time: 05/07/2021/11:47:09 AM    Final    VAS [place] LOWER EXTREMITY VENOUS (DVT) (ONLY [facility] & [facility] 7a-7p)  Result Date: 05/06/2021  Lower Venous DVT Study Patient Name:  MOSI HANNOLD  Date of Exam:   05/06/2021 Medical Rec #: [id number]      Accession #:    [id number] Date of Birth: 26-Sep-1961     Patient Gender: M Patient Age:   59Y Exam Location:  [facility] Procedure:      VAS [place] LOWER EXTREMITY VENOUS (DVT) Referring Phys: [id number] LINDSEY A LAYDEN --------------------------------------------------------------------------------  Indications: Edema.  Comparison Study: Exam at [facility] 02/01/18 - extensive DVT to LLE                   from"iliac to ankle" per report Performing Technologist: Rogelia Rohrer  Examination Guidelines: A complete evaluation includes B-mode imaging, spectral Doppler, color Doppler, and power Doppler as needed of all accessible portions of each vessel. Bilateral testing is considered an integral part of a complete examination. Limited examinations for reoccurring indications may be performed as noted. The reflux portion of the exam is performed with the patient in reverse Trendelenburg.  +---------+---------------+---------+-----------+----------+--------------+ RIGHT    CompressibilityPhasicitySpontaneityPropertiesThrombus Aging +---------+---------------+---------+-----------+----------+--------------+ CFV      Full           Yes      Yes                                 +---------+---------------+---------+-----------+----------+--------------+ SFJ      Full                                                        +---------+---------------+---------+-----------+----------+--------------+ FV Prox  Full           Yes      Yes                                  +---------+---------------+---------+-----------+----------+--------------+ FV Mid   Full           Yes      Yes                                 +---------+---------------+---------+-----------+----------+--------------+ FV DistalFull           Yes  Yes                                 +---------+---------------+---------+-----------+----------+--------------+ PFV      Full                                                        +---------+---------------+---------+-----------+----------+--------------+ POP      Full           Yes      Yes                                 +---------+---------------+---------+-----------+----------+--------------+ PTV      Full                                                        +---------+---------------+---------+-----------+----------+--------------+ PERO     Full                                                        +---------+---------------+---------+-----------+----------+--------------+   +---------+---------------+---------+-----------+----------+--------------+ LEFT     CompressibilityPhasicitySpontaneityPropertiesThrombus Aging +---------+---------------+---------+-----------+----------+--------------+ CFV      Full           Yes      Yes                                 +---------+---------------+---------+-----------+----------+--------------+ SFJ      Full                                                        +---------+---------------+---------+-----------+----------+--------------+ FV Prox  Full           Yes      Yes                                 +---------+---------------+---------+-----------+----------+--------------+ FV Mid   Full           Yes      Yes                                 +---------+---------------+---------+-----------+----------+--------------+ FV DistalFull           Yes      Yes                                  +---------+---------------+---------+-----------+----------+--------------+ PFV      Full                                                        +---------+---------------+---------+-----------+----------+--------------+  POP      Full           Yes      Yes                                 +---------+---------------+---------+-----------+----------+--------------+ PTV      Full                                                        +---------+---------------+---------+-----------+----------+--------------+ PERO     Full                                                        +---------+---------------+---------+-----------+----------+--------------+     Summary: BILATERAL: - No evidence of deep vein thrombosis seen in the lower extremities, bilaterally. - No evidence of superficial venous thrombosis in the lower extremities, bilaterally. -No evidence of popliteal cyst, bilaterally. -Subcutaneous edema in BLE from knee to ankle.  *See table(s) above for measurements and observations. Electronically signed by Harold Barban MD on 05/06/2021 at 9:22:36 PM.    Final     ASSESSMENT / PLAN:  Ongoing Right Pleural Effusion suspect secondary to severely reduced EF and mod-severe TR.  Plan -Will perform thoracentesis -Send Cytology, Culture, pH, protein, LDH, Glucose   Pulmonary will follow labs and follow up as needed   Hayden Pedro, AGACNP-BC [place] Pgr: [phone]

## 2021-05-07 NOTE — H&P (View-Only) (Signed)
Advanced Heart Failure Rounding Note  PCP-Cardiologist: None    Patient Profile   [name] is a 60 y/o male with a h/o HTN, CKD 3b (Scr 1.8), DVT, systolic CHF and poor compliance. Presented w/ CP and LEE.   Hstrop 59 -> 48  BNP 961  Had CT of chest with subsegmental LL PE, Lrge R pleural effusion and evidence of HF with contrast reflux into hepatic veins. LE doppler negative for DVT   Bedside echo EF 20% RV dilated and severely HK  Subjective:    On IV heparin  On IV Lasix 80 mg bid. UOP not impressive, only 975 cc charted yesterday. Wt unchanged   SBPs 120s-150s SCr 1.8>1.9  K 3.7   CO2 ok at 27    Currently getting TTE. Feels ok. No resting dyspnea.     Objective:   Weight Range: 94.9 kg Body mass index is 30.02 kg/m.   Vital Signs:   Temp:  [97.8 F (36.6 C)-98.8 F (37.1 C)] 98 F (36.7 C) (05/26 0754) Pulse Rate:  [95-117] 97 (05/26 0754) Resp:  [16-31] 16 (05/26 0754) BP: (115-152)/(93-123) 120/96 (05/26 0754) SpO2:  [93 %-100 %] 100 % (05/26 0754) Weight:  [94.8 kg-94.9 kg] 94.9 kg (05/26 0500) Last BM Date: 05/06/21  Weight change: Filed Weights   05/06/21 1913 05/07/21 0046 05/07/21 0500  Weight: 94.8 kg 94.9 kg 94.9 kg    Intake/Output:   Intake/Output Summary (Last 24 hours) at 05/07/2021 0921 Last data filed at 05/07/2021 0811 Gross per 24 hour  Intake 955.56 ml  Output 975 ml  Net -19.44 ml      Physical Exam    General:  Well appearing, moderately obese. No resp difficulty HEENT: Normal Neck: Supple. JVP elevated to jaw . Carotids 2+ bilat; no bruits. No lymphadenopathy or thyromegaly appreciated. Cor: PMI nondisplaced. Regular rhythm, tachy rate. No rubs, gallops or murmurs. Lungs: decreased BS at the bases, no wheezing  Abdomen: obese, Soft, nontender, nondistended. No hepatosplenomegaly. No bruits or masses. Good bowel sounds. Extremities: No cyanosis, clubbing, rash, 1+ bilateral pretibial edema Neuro: Alert &  orientedx3, cranial nerves grossly intact. moves all 4 extremities w/o difficulty. Affect pleasant   Telemetry   NSR - sinus tach 90s-low 100s   EKG    No new EKG to review   Labs    CBC Recent Labs    05/06/21 1127 05/07/21 0231  WBC 10.0 8.2  HGB 15.2 14.6  HCT 48.6 46.4  MCV 90.7 88.7  PLT 197 841   Basic Metabolic Panel Recent Labs    05/06/21 1127 05/06/21 1940 05/07/21 0231  NA 135  --  135  K 3.0*  --  3.7  CL 97*  --  97*  CO2 26  --  27  GLUCOSE 327*  --  232*  BUN 18  --  19  CREATININE 1.83*  --  1.90*  CALCIUM 8.6*  --  8.4*  MG  --  1.5*  --    Liver Function Tests Recent Labs    05/06/21 1127 05/07/21 0231  AST 16 18  ALT 16 16  ALKPHOS 80 86  BILITOT 2.7* 2.4*  PROT 6.7 6.4*  ALBUMIN 3.5 3.2*   Recent Labs    05/06/21 1127  LIPASE 26   Cardiac Enzymes No results for input(s): CKTOTAL, CKMB, CKMBINDEX, TROPONINI in the last 72 hours.  BNP: BNP (last 3 results) Recent Labs    05/06/21 1127  BNP 961.5*  ProBNP (last 3 results) No results for input(s): PROBNP in the last 8760 hours.   D-Dimer No results for input(s): DDIMER in the last 72 hours. Hemoglobin A1C No results for input(s): HGBA1C in the last 72 hours. Fasting Lipid Panel Recent Labs    05/06/21 1940  CHOL 144  HDL 27*  LDLCALC 97  TRIG 102  CHOLHDL 5.3   Thyroid Function Tests Recent Labs    05/07/21 0231  TSH 2.149    Other results:   Imaging    DG Chest 2 View  Result Date: 05/06/2021 CLINICAL DATA:  Chest pain EXAM: CHEST - 2 VIEW COMPARISON:  06/20/2019 FINDINGS: Persistent or recurrent right pleural effusion with adjacent atelectasis. Probable mild pulmonary edema. No pneumothorax. Similar cardiomegaly. IMPRESSION: Persistent or recurrent moderate right pleural effusion with adjacent atelectasis. Cardiomegaly with probable mild pulmonary edema. Electronically Signed   By: Macy Mis M.D.   On: 05/06/2021 12:21   CT Angio Chest PE W  and/or Wo Contrast  Result Date: 05/06/2021 CLINICAL DATA:  Suspected pulmonary embolism in a 60 year old male EXAM: CT ANGIOGRAPHY CHEST WITH CONTRAST TECHNIQUE: Multidetector CT imaging of the chest was performed using the standard protocol during bolus administration of intravenous contrast. Multiplanar CT image reconstructions and MIPs were obtained to evaluate the vascular anatomy. CONTRAST:  83mL OMNIPAQUE IOHEXOL 350 MG/ML SOLN COMPARISON:  August 03, 2018. FINDINGS: Cardiovascular: Normal caliber of the thoracic aorta. No aneurysmal dilation. Heart size is enlarged similar to the prior study. No substantial pericardial effusion. Exam is optimized for evaluation of the pulmonary vascular bed. No central pulmonary embolism. Hypoperfusion of the RIGHT lower lobe vascular bed is again noted with chronic volume loss in the RIGHT lower lobe. Potential filling defect at the segmental level in the RIGHT lower lobe, difficult to assess due to variable contrast attenuation in this area. Subsegmental emboli in the LEFT lower lobe. Lingular and LEFT upper lobe segmental and subsegmental emboli. Mediastinum/Nodes: Thoracic inlet structures are grossly unremarkable. 1.6 cm subcarinal lymph node of uncertain significance. No axillary lymphadenopathy. Mild fullness of hilar nodal tissue unchanged. Patulous esophagus with some debris. Lungs/Pleura: RIGHT lower lobe volume loss and RIGHT pleural effusion is unchanged compared to previous imaging. Larger airways are patent. Upper Abdomen: Incidental imaging of upper abdominal contents with reflux of contrast into hepatic veins as before. Musculoskeletal: No acute musculoskeletal process. No destructive bone finding. Review of the MIP images confirms the above findings. IMPRESSION: 1. LEFT lower lobe emboli and LEFT upper lobe emboli segmental and subsegmental level. 2. Poor opacification of RIGHT pulmonary vascular bed in the lower lobe in the setting of chronic airspace  disease and effusion. (Similar to the prior study) difficult to exclude small embolus in this location potentially with segmental or subsegmental emboli. 3. Persistent global cardiac enlargement and signs of cardiac dysfunction with reflux of contrast into hepatic veins as on the previous study. Consider correlation with echocardiography as warranted. 4. Enlargement of subcarinal lymph nodes, potentially reactive. Given persistent airspace process in the RIGHT lung base and nodal enlargement would suggest pulmonology follow-up for further assessment and consideration of at a minimum three-month follow-up CT for further assessment. Critical Value/emergent results were called by telephone at the time of interpretation on 05/06/2021 at 3:20 pm to provider [facility] , who verbally acknowledged these results. Electronically Signed   By: Zetta Bills M.D.   On: 05/06/2021 15:32   VAS [place] LOWER EXTREMITY VENOUS (DVT) (ONLY [facility] & [facility] 7a-7p)  Result Date: 05/06/2021  Lower Venous DVT Study Patient Name:  KIMANI HOVIS  Date of Exam:   05/06/2021 Medical Rec #: [id number]      Accession #:    [id number] Date of Birth: 1961/02/13     Patient Gender: M Patient Age:   59Y Exam Location:  [facility] Procedure:      VAS [place] LOWER EXTREMITY VENOUS (DVT) Referring Phys: [id number] [place] --------------------------------------------------------------------------------  Indications: Edema.  Comparison Study: Exam at [facility] 02/01/18 - extensive DVT to LLE                   from"iliac to ankle" per report Performing Technologist: Rogelia Rohrer  Examination Guidelines: A complete evaluation includes B-mode imaging, spectral Doppler, color Doppler, and power Doppler as needed of all accessible portions of each vessel. Bilateral testing is considered an integral part of a complete examination. Limited examinations for reoccurring indications may be performed as noted. The reflux portion of the exam is performed  with the patient in reverse Trendelenburg.  +---------+---------------+---------+-----------+----------+--------------+ RIGHT    CompressibilityPhasicitySpontaneityPropertiesThrombus Aging +---------+---------------+---------+-----------+----------+--------------+ CFV      Full           Yes      Yes                                 +---------+---------------+---------+-----------+----------+--------------+ SFJ      Full                                                        +---------+---------------+---------+-----------+----------+--------------+ FV Prox  Full           Yes      Yes                                 +---------+---------------+---------+-----------+----------+--------------+ FV Mid   Full           Yes      Yes                                 +---------+---------------+---------+-----------+----------+--------------+ FV DistalFull           Yes      Yes                                 +---------+---------------+---------+-----------+----------+--------------+ PFV      Full                                                        +---------+---------------+---------+-----------+----------+--------------+ POP      Full           Yes      Yes                                 +---------+---------------+---------+-----------+----------+--------------+ PTV      Full                                                        +---------+---------------+---------+-----------+----------+--------------+  PERO     Full                                                        +---------+---------------+---------+-----------+----------+--------------+   +---------+---------------+---------+-----------+----------+--------------+ LEFT     CompressibilityPhasicitySpontaneityPropertiesThrombus Aging +---------+---------------+---------+-----------+----------+--------------+ CFV      Full           Yes      Yes                                  +---------+---------------+---------+-----------+----------+--------------+ SFJ      Full                                                        +---------+---------------+---------+-----------+----------+--------------+ FV Prox  Full           Yes      Yes                                 +---------+---------------+---------+-----------+----------+--------------+ FV Mid   Full           Yes      Yes                                 +---------+---------------+---------+-----------+----------+--------------+ FV DistalFull           Yes      Yes                                 +---------+---------------+---------+-----------+----------+--------------+ PFV      Full                                                        +---------+---------------+---------+-----------+----------+--------------+ POP      Full           Yes      Yes                                 +---------+---------------+---------+-----------+----------+--------------+ PTV      Full                                                        +---------+---------------+---------+-----------+----------+--------------+ PERO     Full                                                        +---------+---------------+---------+-----------+----------+--------------+  Summary: BILATERAL: - No evidence of deep vein thrombosis seen in the lower extremities, bilaterally. - No evidence of superficial venous thrombosis in the lower extremities, bilaterally. -No evidence of popliteal cyst, bilaterally. -Subcutaneous edema in BLE from knee to ankle.  *See table(s) above for measurements and observations. Electronically signed by Harold Barban MD on 05/06/2021 at 9:22:36 PM.    Final       Medications:     Scheduled Medications: . digoxin  0.125 mg Oral Daily  . furosemide  80 mg Intravenous BID  . insulin aspart  0-15 Units Subcutaneous TID WC  . sacubitril-valsartan  1 tablet Oral BID  . spironolactone   12.5 mg Oral Daily     Infusions: . heparin 1,900 Units/hr (05/07/21 7209)     PRN Medications:  acetaminophen **OR** acetaminophen, polyethylene glycol     Assessment/Plan   1. Acute on chronic systolic HF with R>>L symptoms -Suspect HTN CM but also at risk for iCM - Echo 2020  EF 20% markedly dilated. RV moderately down  - Cath arranged but did not f/u  - Bedside echo on admit EF 20% RV dilated and severely HK - He has surprisingly mild symptoms (R>L) in the face of severe biventricular cardiomyopathy but remains quite tachycardic - Continue IV Lasix 80 mg bid  - Continue Entresto 49-51 bid - Increase Spiro to 25 daily - Continue digoxin 0.125 mg daily  - eventual R/L cath+/- cMRI prior to d/c - TTE in process   2. HTN, uncontrolled - better this am, titrate GDMT as above  3. Acute RLL subsegmental PE - heparin for now - u/s LEs  negative for DVT  4. Large R pleural effusion - likely need thora if not improving with diuresis   5. 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 Elevated blood glucose - likely new DM2 diagnosis.  - per FTPS.  HgBa1c pending   7. Elevated bilirubin - suspect related to RV failure   Length of Stay: [place], PA-C  05/07/2021, 9:21 AM  Advanced Heart Failure Team Pager [phone] (M-F; 7a - 5p)  Please contact [facility] for night-coverage after hours (5p -7a ) and weekends on [email or web address]   Patient seen and examined with the above-signed Advanced Practice Provider and/or Housestaff. I personally reviewed laboratory data, imaging studies and relevant notes. I independently examined the patient and formulated the important aspects of the plan. I have edited the note to reflect any of my changes or salient points. I have personally discussed the plan with the patient and/or family.  Not much response to lasix. SCr slightly higher. Denies SOB, orthopnea or PND.    General:  Sitting up in bed  No resp difficulty HEENT: normal Neck: supple. JVP 9-10. Carotids 2+ bilat; no bruits. No lymphadenopathy or thryomegaly appreciated. Cor: PMI nondisplaced. Regular rate & rhythm. No rubs, gallops or murmurs. Lungs: clear Abdomen: soft, nontender, nondistended. No hepatosplenomegaly. No bruits or masses. Good bowel sounds. Extremities: no cyanosis, clubbing, rash, 1+ edema Neuro: alert & orientedx3, cranial nerves grossly intact. moves all 4 extremities w/o difficulty. Affect pleasant  Would hold IV lasix today. Paln R/L cath tomorrow if SCr < 2.0 (otherwise just RHC). Hold spiro as well. Would ask pulmoanry to see to evaluate R pleural effusion.   Continue heparin.   Glori Bickers, MD  11:35 AM

## 2021-05-07 NOTE — Progress Notes (Signed)
ANTICOAGULATION CONSULT NOTE - Follow Up Consult  Pharmacy Consult for heparin Indication: pulmonary embolus  Labs: Recent Labs    05/06/21 1127 05/06/21 1327 05/07/21 0231  HGB 15.2  --  14.6  HCT 48.6  --  46.4  PLT 197  --  198  HEPARINUNFRC  --   --  0.18*  CREATININE 1.83*  --   --   TROPONINIHS 59* 48*  --     Assessment: 59yo male subtherapeutic on heparin with initial dosing for PE; no gtt issues or signs of bleeding per RN.  Goal of Therapy:  Heparin level 0.3-0.7 units/ml   Plan:  Will rebolus with heparin 4000 units and increase heparin gtt by 4 units/kg/hr to 1900 units/hr and check level in 6 hours.    Wynona Neat, PharmD, BCPS  05/07/2021,3:35 AM

## 2021-05-08 ENCOUNTER — Encounter (HOSPITAL_COMMUNITY)
Admission: EM | Disposition: A | Discharge status: Home / Self Care | Payer: Self-pay | Source: Home / Self Care | Attending: Family Medicine

## 2021-05-08 ENCOUNTER — Encounter (HOSPITAL_COMMUNITY): Payer: Self-pay | Admitting: Internal Medicine

## 2021-05-08 DIAGNOSIS — I5021 Acute systolic (congestive) heart failure: Secondary | ICD-10-CM

## 2021-05-08 DIAGNOSIS — I2510 Atherosclerotic heart disease of native coronary artery without angina pectoris: Secondary | ICD-10-CM

## 2021-05-08 DIAGNOSIS — Z9889 Other specified postprocedural states: Secondary | ICD-10-CM

## 2021-05-08 DIAGNOSIS — N1832 Chronic kidney disease, stage 3b: Secondary | ICD-10-CM

## 2021-05-08 HISTORY — PX: RIGHT/LEFT HEART CATH AND CORONARY ANGIOGRAPHY: CATH118266

## 2021-05-08 LAB — GLUCOSE, CAPILLARY
Glucose-Capillary: 121 mg/dL — ABNORMAL HIGH (ref 70–99)
Glucose-Capillary: 119 mg/dL — ABNORMAL HIGH (ref 70–99)
Glucose-Capillary: 209 mg/dL — ABNORMAL HIGH (ref 70–99)
Glucose-Capillary: 163 mg/dL — ABNORMAL HIGH (ref 70–99)

## 2021-05-08 LAB — POCT I-STAT EG7
Acid-Base Excess: 0 mmol/L (ref 0.0–2.0)
Acid-Base Excess: 1 mmol/L (ref 0.0–2.0)
Bicarbonate: 25 mmol/L (ref 20.0–28.0)
Bicarbonate: 26.7 mmol/L (ref 20.0–28.0)
Calcium, Ion: 0.97 mmol/L — ABNORMAL LOW (ref 1.15–1.40)
Calcium, Ion: 1.13 mmol/L — ABNORMAL LOW (ref 1.15–1.40)
HCT: 42 % (ref 39.0–52.0)
HCT: 44 % (ref 39.0–52.0)
Hemoglobin: 14.3 g/dL (ref 13.0–17.0)
Hemoglobin: 15 g/dL (ref 13.0–17.0)
O2 Saturation: 71 %
O2 Saturation: 72 %
Potassium: 3 mmol/L — ABNORMAL LOW (ref 3.5–5.1)
Potassium: 3.3 mmol/L — ABNORMAL LOW (ref 3.5–5.1)
Sodium: 137 mmol/L (ref 135–145)
Sodium: 139 mmol/L (ref 135–145)
TCO2: 26 mmol/L (ref 22–32)
TCO2: 28 mmol/L (ref 22–32)
pCO2, Ven: 42.2 mmHg — ABNORMAL LOW (ref 44.0–60.0)
pCO2, Ven: 44.8 mmHg (ref 44.0–60.0)
pH, Ven: 7.381 (ref 7.250–7.430)
pH, Ven: 7.383 (ref 7.250–7.430)
pO2, Ven: 38 mmHg (ref 32.0–45.0)
pO2, Ven: 38 mmHg (ref 32.0–45.0)

## 2021-05-08 LAB — BASIC METABOLIC PANEL
Anion gap: 9 (ref 5–15)
Anion gap: 10 (ref 5–15)
BUN: 26 mg/dL — ABNORMAL HIGH (ref 6–20)
BUN: 25 mg/dL — ABNORMAL HIGH (ref 6–20)
CO2: 23 mmol/L (ref 22–32)
CO2: 28 mmol/L (ref 22–32)
Calcium: 8.4 mg/dL — ABNORMAL LOW (ref 8.9–10.3)
Calcium: 8.4 mg/dL — ABNORMAL LOW (ref 8.9–10.3)
Chloride: 101 mmol/L (ref 98–111)
Chloride: 96 mmol/L — ABNORMAL LOW (ref 98–111)
Creatinine, Ser: 1.56 mg/dL — ABNORMAL HIGH (ref 0.61–1.24)
Creatinine, Ser: 1.77 mg/dL — ABNORMAL HIGH (ref 0.61–1.24)
GFR, Estimated: 51 mL/min — ABNORMAL LOW (ref >60)
GFR, Estimated: 44 mL/min — ABNORMAL LOW (ref >60)
Glucose, Bld: 113 mg/dL — ABNORMAL HIGH (ref 70–99)
Glucose, Bld: 203 mg/dL — ABNORMAL HIGH (ref 70–99)
Potassium: 3.6 mmol/L (ref 3.5–5.1)
Potassium: 3.5 mmol/L (ref 3.5–5.1)
Sodium: 133 mmol/L — ABNORMAL LOW (ref 135–145)
Sodium: 134 mmol/L — ABNORMAL LOW (ref 135–145)

## 2021-05-08 LAB — CBC
HCT: 43.4 % (ref 39.0–52.0)
Hemoglobin: 13.9 g/dL (ref 13.0–17.0)
MCH: 27.9 pg (ref 26.0–34.0)
MCHC: 32 g/dL (ref 30.0–36.0)
MCV: 87.1 fL (ref 80.0–100.0)
Platelets: 168 10*3/uL (ref 150–400)
RBC: 4.98 MIL/uL (ref 4.22–5.81)
RDW: 14.2 % (ref 11.5–15.5)
WBC: 12.7 10*3/uL — ABNORMAL HIGH (ref 4.0–10.5)
nRBC: 0 % (ref 0.0–0.2)

## 2021-05-08 LAB — HEPARIN LEVEL (UNFRACTIONATED)
Heparin Unfractionated: 0.29 IU/mL — ABNORMAL LOW (ref 0.30–0.70)
Heparin Unfractionated: 0.88 IU/mL — ABNORMAL HIGH (ref 0.30–0.70)

## 2021-05-08 LAB — POCT I-STAT 7, (LYTES, BLD GAS, ICA,H+H)
Acid-Base Excess: 0 mmol/L (ref 0.0–2.0)
Bicarbonate: 24.4 mmol/L (ref 20.0–28.0)
Calcium, Ion: 0.99 mmol/L — ABNORMAL LOW (ref 1.15–1.40)
HCT: 42 % (ref 39.0–52.0)
Hemoglobin: 14.3 g/dL (ref 13.0–17.0)
O2 Saturation: 99 %
Potassium: 3.1 mmol/L — ABNORMAL LOW (ref 3.5–5.1)
Sodium: 139 mmol/L (ref 135–145)
TCO2: 26 mmol/L (ref 22–32)
pCO2 arterial: 38.4 mmHg (ref 32.0–48.0)
pH, Arterial: 7.412 (ref 7.350–7.450)
pO2, Arterial: 135 mmHg — ABNORMAL HIGH (ref 83.0–108.0)

## 2021-05-08 LAB — PH, BODY FLUID: pH, Body Fluid: 7.9

## 2021-05-08 LAB — CYTOLOGY - NON PAP

## 2021-05-08 LAB — AMYLASE, PLEURAL OR PERITONEAL FLUID: Amylase, Fluid: 12 U/L

## 2021-05-08 SURGERY — RIGHT/LEFT HEART CATH AND CORONARY ANGIOGRAPHY
Anesthesia: LOCAL

## 2021-05-08 MED ORDER — HEPARIN SODIUM (PORCINE) 1000 UNIT/ML IJ SOLN
INTRAMUSCULAR | Status: AC
  Filled 2021-05-08: qty 1

## 2021-05-08 MED ORDER — ONDANSETRON HCL 4 MG/2ML IJ SOLN: 4.0000 mg | Freq: Four times a day (QID) | INTRAMUSCULAR | Status: DC | PRN

## 2021-05-08 MED ORDER — LABETALOL HCL 5 MG/ML IV SOLN: 10.0000 mg | INTRAVENOUS | Status: AC | PRN

## 2021-05-08 MED ORDER — LIVING WELL WITH DIABETES BOOK
Freq: Once | Status: AC
  Administered 2021-05-08: 1
  Filled 2021-05-08: qty 1

## 2021-05-08 MED ORDER — INSULIN GLARGINE 100 UNIT/ML ~~LOC~~ SOLN
10.0000 [IU] | Freq: Every day | SUBCUTANEOUS | Status: DC
  Administered 2021-05-08 – 2021-05-09 (×2): 10 [IU] via SUBCUTANEOUS
  Filled 2021-05-08 (×2): qty 0.1

## 2021-05-08 MED ORDER — HEPARIN SODIUM (PORCINE) 1000 UNIT/ML IJ SOLN
INTRAMUSCULAR | Status: DC | PRN
  Administered 2021-05-08: 5000 [IU] via INTRAVENOUS

## 2021-05-08 MED ORDER — HYDRALAZINE HCL 20 MG/ML IJ SOLN: 10.0000 mg | INTRAMUSCULAR | Status: AC | PRN

## 2021-05-08 MED ORDER — HEPARIN (PORCINE) IN NACL 1000-0.9 UT/500ML-% IV SOLN
INTRAVENOUS | Status: DC | PRN
  Administered 2021-05-08 (×2): 500 mL

## 2021-05-08 MED ORDER — IOHEXOL 350 MG/ML SOLN
INTRAVENOUS | Status: DC | PRN
  Administered 2021-05-08: 45 mL

## 2021-05-08 MED ORDER — VERAPAMIL HCL 2.5 MG/ML IV SOLN
INTRAVENOUS | Status: DC | PRN
  Administered 2021-05-08: 10 mL via INTRA_ARTERIAL

## 2021-05-08 MED ORDER — APIXABAN 5 MG PO TABS
10.0000 mg | ORAL_TABLET | Freq: Two times a day (BID) | ORAL | Status: DC
  Administered 2021-05-08 – 2021-05-09 (×2): 10 mg via ORAL
  Filled 2021-05-08 (×2): qty 2

## 2021-05-08 MED ORDER — FENTANYL CITRATE (PF) 100 MCG/2ML IJ SOLN
INTRAMUSCULAR | Status: DC | PRN
  Administered 2021-05-08: 25 ug via INTRAVENOUS

## 2021-05-08 MED ORDER — APIXABAN 5 MG PO TABS: 5.0000 mg | ORAL_TABLET | Freq: Two times a day (BID) | ORAL | Status: DC

## 2021-05-08 MED ORDER — SODIUM CHLORIDE 0.9% FLUSH: 3.0000 mL | INTRAVENOUS | Status: DC | PRN

## 2021-05-08 MED ORDER — LIDOCAINE HCL (PF) 1 % IJ SOLN
INTRAMUSCULAR | Status: AC
  Filled 2021-05-08: qty 30

## 2021-05-08 MED ORDER — HEPARIN (PORCINE) IN NACL 1000-0.9 UT/500ML-% IV SOLN
INTRAVENOUS | Status: AC
  Filled 2021-05-08: qty 1000

## 2021-05-08 MED ORDER — VERAPAMIL HCL 2.5 MG/ML IV SOLN
INTRAVENOUS | Status: AC
  Filled 2021-05-08: qty 2

## 2021-05-08 MED ORDER — LIDOCAINE HCL (PF) 1 % IJ SOLN
INTRAMUSCULAR | Status: DC | PRN
  Administered 2021-05-08 (×2): 2 mL

## 2021-05-08 MED ORDER — POTASSIUM CHLORIDE CRYS ER 20 MEQ PO TBCR
40.0000 meq | EXTENDED_RELEASE_TABLET | Freq: Once | ORAL | Status: AC
  Administered 2021-05-08: 40 meq via ORAL
  Filled 2021-05-08: qty 2

## 2021-05-08 MED ORDER — ACETAMINOPHEN 325 MG PO TABS: 650.0000 mg | ORAL_TABLET | ORAL | Status: DC | PRN

## 2021-05-08 MED ORDER — MIDAZOLAM HCL 2 MG/2ML IJ SOLN
INTRAMUSCULAR | Status: AC
  Filled 2021-05-08: qty 2

## 2021-05-08 MED ORDER — SODIUM CHLORIDE 0.9% FLUSH
3.0000 mL | Freq: Two times a day (BID) | INTRAVENOUS | Status: DC
  Administered 2021-05-08: 3 mL via INTRAVENOUS

## 2021-05-08 MED ORDER — SODIUM CHLORIDE 0.9 % IV SOLN: INTRAVENOUS | Status: AC

## 2021-05-08 MED ORDER — SODIUM CHLORIDE 0.9 % IV SOLN: 250.0000 mL | INTRAVENOUS | Status: DC | PRN

## 2021-05-08 MED ORDER — FENTANYL CITRATE (PF) 100 MCG/2ML IJ SOLN
INTRAMUSCULAR | Status: AC
  Filled 2021-05-08: qty 2

## 2021-05-08 MED ORDER — MIDAZOLAM HCL 2 MG/2ML IJ SOLN
INTRAMUSCULAR | Status: DC | PRN
  Administered 2021-05-08: 1 mg via INTRAVENOUS

## 2021-05-08 SURGICAL SUPPLY — 9 items
CATH 5FR JL3.5 JR4 ANG PIG MP (CATHETERS) ×2 IMPLANT
CATH BALLN WEDGE 5F 110CM (CATHETERS) ×2 IMPLANT
DEVICE RAD COMP TR BAND LRG (VASCULAR PRODUCTS) ×2 IMPLANT
GLIDESHEATH SLEND SS 6F .021 (SHEATH) ×2 IMPLANT
GUIDEWIRE INQWIRE 1.5J.035X260 (WIRE) ×1 IMPLANT
INQWIRE 1.5J .035X260CM (WIRE) ×2
PACK CARDIAC CATHETERIZATION (CUSTOM PROCEDURE TRAY) ×2 IMPLANT
SHEATH GLIDE SLENDER 4/5FR (SHEATH) ×2 IMPLANT
TRANSDUCER W/STOPCOCK (MISCELLANEOUS) ×2 IMPLANT

## 2021-05-08 NOTE — Progress Notes (Addendum)
Advanced Heart Failure Rounding Note  PCP-Cardiologist: None    Subjective   Denies CP. Says breathing better after R thora yesterday.   R/L Cath done this am   Ao = 108/71 (88) LV = 112/22 RA =  10 RV = 59/14 PA = 57/22 (37) PCW = 14 Fick cardiac output/index = 5.4/2.5 PVR = 4.3 WU SVR = 1166 Ao sat = 99% PA sat = 71%, 72%  Assessment: 1. Minimal CAD 2. Severe NICM  3. Mild PAH 4. Mildly elevated filling pressure with relatively preserved CO   Objective:   Weight Range: 95.2 kg Body mass index is 30.11 kg/m.   Vital Signs:   Temp:  [97.8 F (36.6 C)-98.7 F (37.1 C)] 98.2 F (36.8 C) (05/27 0746) Pulse Rate:  [93-110] 97 (05/27 0924) Resp:  [16-18] 17 (05/27 0746) BP: (101-126)/(69-94) 126/90 (05/27 0926) SpO2:  [95 %-100 %] 95 % (05/27 0949) Weight:  [95.2 kg] 95.2 kg (05/27 0230) Last BM Date: 05/06/21  Weight change: Filed Weights   05/07/21 0046 05/07/21 0500 05/08/21 0230  Weight: 94.9 kg 94.9 kg 95.2 kg    Intake/Output:   Intake/Output Summary (Last 24 hours) at 05/08/2021 1104 Last data filed at 05/08/2021 0529 Gross per 24 hour  Intake 1057.06 ml  Output 2775 ml  Net -1717.94 ml      Physical Exam    General:  Sitting up in bed. No resp difficulty HEENT: normal Neck: supple. JVP 10 . Carotids 2+ bilat; no bruits. No lymphadenopathy or thryomegaly appreciated. Cor: PMI nondisplaced. Regular rate & rhythm. No rubs, gallops or murmurs. Lungs: clear Abdomen: soft, nontender, nondistended. No hepatosplenomegaly. No bruits or masses. Good bowel sounds. Extremities: no cyanosis, clubbing, rash, edema Neuro: alert & orientedx3, cranial nerves grossly intact. moves all 4 extremities w/o difficulty. Affect pleasant  Telemetry   NSR - sinus 90-100 Personally reviewed   Labs    CBC Recent Labs    05/07/21 0231 05/08/21 0353  WBC 8.2 12.7*  HGB 14.6 13.9  HCT 46.4 43.4  MCV 88.7 87.1  PLT 198 086   Basic Metabolic  Panel Recent Labs    05/06/21 1940 05/07/21 0231 05/08/21 0353  NA  --  135 133*  K  --  3.7 3.6  CL  --  97* 101  CO2  --  27 23  GLUCOSE  --  232* 113*  BUN  --  19 26*  CREATININE  --  1.90* 1.56*  CALCIUM  --  8.4* 8.4*  MG 1.5*  --   --    Liver Function Tests Recent Labs    05/06/21 1127 05/07/21 0231  AST 16 18  ALT 16 16  ALKPHOS 80 86  BILITOT 2.7* 2.4*  PROT 6.7 6.4*  ALBUMIN 3.5 3.2*   Recent Labs    05/06/21 1127  LIPASE 26   Cardiac Enzymes No results for input(s): CKTOTAL, CKMB, CKMBINDEX, TROPONINI in the last 72 hours.  BNP: BNP (last 3 results) Recent Labs    05/06/21 1127  BNP 961.5*    ProBNP (last 3 results) No results for input(s): PROBNP in the last 8760 hours.   D-Dimer No results for input(s): DDIMER in the last 72 hours. Hemoglobin A1C Recent Labs    05/07/21 0231  HGBA1C 11.6*   Fasting Lipid Panel Recent Labs    05/06/21 1940  CHOL 144  HDL 27*  LDLCALC 97  TRIG 102  CHOLHDL 5.3   Thyroid Function Tests Recent  Labs    05/07/21 0231  TSH 2.149    Other results:   Imaging    CARDIAC CATHETERIZATION  Result Date: 05/08/2021  RPDA lesion is 50% stenosed.  1st Diag lesion is 20% stenosed.  Dist LAD lesion is 20% stenosed.  Findings: Ao = 108/71 (88) LV = 112/22 RA =  10 RV = 59/14 PA = 57/22 (37) PCW = 14 Fick cardiac output/index = 5.4/2.5 PVR = 4.3 WU SVR = 1166 Ao sat = 99% PA sat = 71%, 72% Assessment: 1. Minimal CAD 2. Severe NICM 3. Mild PAH 4. Mildly elevated filling pressure with relatively preserved CO Plan/Discussion: Severe NICM. Hemodynamics much better than expected. Will plan cMRI later today. Can switch heparin to apixaban for PE. Likely home tomorrow. Glori Bickers, MD 11:03 AM   DG CHEST PORT 1 VIEW  Result Date: 05/07/2021 CLINICAL DATA:  Status post thoracentesis. EXAM: PORTABLE CHEST 1 VIEW COMPARISON:  05/06/2021 FINDINGS: Marked reduction in right pleural effusion without evidence  for pneumothorax after thoracentesis. Persistent consolidative opacity noted at the right base. The cardio pericardial silhouette is enlarged. The visualized bony structures of the thorax show no acute abnormality. Telemetry leads overlie the chest. IMPRESSION: Marked reduction in right pleural effusion after thoracentesis. No pneumothorax. Electronically Signed   By: Misty Stanley M.D.   On: 05/07/2021 17:20     Medications:     Scheduled Medications: . [MAR Hold] digoxin  0.125 mg Oral Daily  . [MAR Hold] insulin aspart  0-15 Units Subcutaneous TID WC & HS  . [MAR Hold] insulin glargine  10 Units Subcutaneous Daily  . [MAR Hold] sacubitril-valsartan  1 tablet Oral BID  . [MAR Hold] sodium chloride flush  3 mL Intravenous Q12H    Infusions: . sodium chloride    . sodium chloride 10 mL/hr at 05/08/21 0649  . heparin 2,050 Units/hr (05/08/21 0529)    PRN Medications: sodium chloride, [MAR Hold] acetaminophen **OR** [MAR Hold] acetaminophen, [MAR Hold] benzonatate, [MAR Hold] guaiFENesin-dextromethorphan, [MAR Hold] polyethylene glycol, sodium chloride flush     Assessment/Plan   1. Acute on chronic systolic HF with R>>L symptoms -Suspect HTN CM  - cath today with minimal CAD. severe NICM. Hemodynamics much better than expected. Will plan cMRI later today. - Echo EF 15% RV severely HK - He has surprisingly mild symptoms (R>L) and compensated hemodynmaics in the face of severe biventricular cardiomyopathy but remains quite tachycardic - Restart po diuretics in am - Continue Entresto 49-51 bid - Restart spiro in am if SCr stable - Continue digoxin 0.125 mg daily  - cMRI today - Possibly home in am  2. HTN, uncontrolled - improved  3. Acute RLL subsegmental PE - u/s LEs  negative for DVT - Can switch heparin to apixaban for PE.  4. Large R pleural effusion - s/p tap   5. 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  Elevated blood glucose - likely new DM2 diagnosis.  - per FTPS.  HgBa1c 11.6  7. Elevated bilirubin - suspect related to RV failure   Length of Stay: 1  Glori Bickers, MD  05/08/2021, 11:04 AM  Advanced Heart Failure Team Pager [phone] (M-F; 7a - 5p)  Please contact [facility] for night-coverage after hours (5p -7a ) and weekends on [email or web address]

## 2021-05-08 NOTE — TOC Initial Note (Signed)
Transition of Care (TOC) - Initial/Assessment Note  Heart Failure   Patient Details  Name: Kurt Woodard MRN: [id number] Date of Birth: 12/29/1960  Transition of Care [facility]) CM/SW Contact:    [place], [employer] Phone Number: 05/08/2021, 2:14 PM  Clinical Narrative:                 CSW spoke with the patient at bedside and completed a very brief SDOH screening with the patient who reported that he is still driving and that Food Stamps would be helpful for him. CSW obtained their signature for the Food Stamp referral for the [facility] to reach out to them. CSW faxed over the Food Stamp application and informed the patient that the [facility] will be in touch. CSW provided the patient with an appointment card for the [facility] outpatient clinic and encouraged them to follow up and to attend the appointment and bring their medications and if anything changes to please reach out so that CSW/HV clinic team can provide support.  TOC will continue to follow through discharge.    Barriers to Discharge: Continued Medical Work up   Patient Goals and CMS Choice        Expected Discharge Plan and Services   In-house Referral: Clinical Social Work     Living arrangements for the past 2 months: Single Family Home                                      Prior Living Arrangements/Services Living arrangements for the past 2 months: Single Family Home   Patient language and need for interpreter reviewed:: Yes        Need for Family Participation in Patient Care: No (Comment) Care giver support system in place?: No (comment)   Criminal Activity/Legal Involvement Pertinent to Current Situation/Hospitalization: No - Comment as needed  Activities of Daily Living      Permission Sought/Granted                  Emotional Assessment Appearance:: Appears stated age Attitude/Demeanor/Rapport: Engaged Affect (typically observed): Pleasant Orientation: : Oriented to  Self,Oriented to Place,Oriented to  Time,Oriented to Situation   Psych Involvement: No (comment)  Admission diagnosis:  Elevated troponin [R77.8] Pulmonary emboli (HCC) [I26.99] Other acute pulmonary embolism, unspecified whether acute cor pulmonale present ([place]) [I26.99] Patient Active Problem List   Diagnosis Date Noted  . S/P thoracentesis   . Acute on chronic systolic congestive heart failure ([place])   . Type 2 diabetes mellitus with hyperglycemia, without long-term current use of insulin ([place])   . Pulmonary emboli ([place]) 05/06/2021  . Chronic congestive heart failure ([place]) 08/27/2019   PCP:  Patient, No Pcp Per (Inactive) Pharmacy:   [place], [place] [address] [place] [address] Phone: [phone] Fax: [place] [address] [place] [address] Phone: [phone] Fax: [phone]  [facility] [place], [place] - [address] [place] [address] Phone: [phone] Fax: [phone]     Social Determinants of Health (SDOH) Interventions Food Insecurity Interventions: Assist with SNAP Application Financial Strain Interventions: Other (Comment) (provided patient with CAFA application & Food Stamp referral) Housing Interventions: Intervention Not Indicated Transportation Interventions: Intervention Not Indicated  Readmission Risk Interventions No flowsheet data found.  Shree Espey, MSW, LCSWA [phone] Heart Failure Social  Worker

## 2021-05-08 NOTE — Progress Notes (Signed)
Brief PCCM Note  Pleural fluid studies consistent with transudate. Lymph predominant may reflect chronicity of effusion. Would expect more normal cell distribution. No WBC nor organisms on gram stain. Follow up cytology results. No further recommendations.

## 2021-05-08 NOTE — Interval H&P Note (Signed)
History and Physical Interval Note:  05/08/2021 10:27 AM  Kurt Woodard  has presented today for surgery, with the diagnosis of hf.  The various methods of treatment have been discussed with the patient and family. After consideration of risks, benefits and other options for treatment, the patient has consented to  Procedure(s): RIGHT/LEFT HEART CATH AND CORONARY ANGIOGRAPHY (N/A) and possible coronary angioplasty as a surgical intervention.  The patient's history has been reviewed, patient examined, no change in status, stable for surgery.  I have reviewed the patient's chart and labs.  Questions were answered to the patient's satisfaction.     [name]

## 2021-05-08 NOTE — Progress Notes (Signed)
ANTICOAGULATION CONSULT NOTE - Follow Up Consult  Pharmacy Consult for Heparin Indication: DVT and PE  No Known Allergies  Patient Measurements: Height: 5\' 10"  (177.8 cm) Weight: 95.2 kg (209 lb 14.1 oz) IBW/kg (Calculated) : 73 Heparin Dosing Weight: 92.3 kg  Vital Signs: Temp: 97.9 F (36.6 C) (05/27 1126) Temp Source: Oral (05/27 1126) BP: 120/88 (05/27 1200) Pulse Rate: 94 (05/27 1126)  Labs: Recent Labs    05/06/21 1127 05/06/21 1327 05/07/21 0231 05/07/21 1000 05/07/21 1551 05/08/21 0353 05/08/21 1110  HGB 15.2  --  14.6  --   --  13.9  --   HCT 48.6  --  46.4  --   --  43.4  --   PLT 197  --  198  --   --  168  --   HEPARINUNFRC  --   --  0.18*   < > 0.37 0.29* 0.88*  CREATININE 1.83*  --  1.90*  --   --  1.56*  --   TROPONINIHS 59* 48*  --   --   --   --   --    < > = values in this interval not displayed.    Estimated Creatinine Clearance: 59.1 mL/min (A) (by C-G formula based on SCr of 1.56 mg/dL (H)).   Assessment: 17 YOM who presented with left-sided chest pain found to have segmental and subsegmental pulmonary emboli. Pt has hx of DVT in 2019. COVID illness ~1 month ago.  Anticoag: Acute PE + DVT - started on IV heparin  - Thoracentesis 5/26 PM 1161ml for pleural effusion  Pt received R/L cath done this am. Heparin now d/c at 1202. Transitioning to Eliquis 10 mg BID x7 days (14 doses) followed by 5 mg BID after 7 days for VTE treatment. First dose to start 6 hrs post sheath removal. Last sheath removal documented at 1100   Goal of Therapy:  Anticoagulation Monitor platelets by anticoagulation protocol: Yes   Plan:  D/c heparin gtt Start Eliquis 10 mg BID on 5/27 at 2200 x7 days (14 doses) Transition to Eliquis 5 mg BID on 6/3 at [place], Student Pharmacist

## 2021-05-08 NOTE — Plan of Care (Signed)
  Problem: Skin Integrity: Goal: Risk for impaired skin integrity will decrease Outcome: Completed/Met   Problem: Safety: Goal: Ability to remain free from injury will improve Outcome: Completed/Met   Problem: Pain Managment: Goal: General experience of comfort will improve Outcome: Completed/Met   Problem: Nutrition: Goal: Adequate nutrition will be maintained Outcome: Completed/Met   Problem: Activity: Goal: Risk for activity intolerance will decrease Outcome: Completed/Met

## 2021-05-08 NOTE — Progress Notes (Signed)
Went to see patient to inspect arm after Cath earlier today.  No sign of hematoma or pseudoaneurysm.  Patient has no complaints at this time.  Delora Fuel, MD 05/08/2021, 5:13 PM PGY-1, [place] Medicine Service pager [phone]

## 2021-05-08 NOTE — Progress Notes (Signed)
Inpatient Diabetes Program Recommendations  AACE/ADA: New Consensus Statement on Inpatient Glycemic Control (2015)  Target Ranges:  Prepandial:   less than 140 mg/dL      Peak postprandial:   less than 180 mg/dL (1-2 hours)      Critically ill patients:  140 - 180 mg/dL   Lab Results  Component Value Date   GLUCAP 119 (H) 05/08/2021   HGBA1C 11.6 (H) 05/07/2021    Review of Glycemic Control Results for SPYROS, WINCH (MRN [id number]) as of 05/08/2021 16:36  Ref. Range 05/07/2021 11:30 05/07/2021 15:40 05/07/2021 21:14 05/08/2021 05:27 05/08/2021 11:39  Glucose-Capillary Latest Ref Range: 70 - 99 mg/dL 207 (H) 183 (H) 252 (H) 121 (H) 119 (H)   Diabetes history: DM 2 (diagnosed in April of 2019) Outpatient Diabetes medications:  None Current orders for Inpatient glycemic control:  Novolog moderate tid with meals and HS Lantus 10 units daily  Inpatient Diabetes Program Recommendations:    Talked with patient about DM diagnosis. He states he was not aware of elevated A1C in 2019.  Discussed current A1C of 11.6% and that average blood sugars are 286 mg/dL. Discussed potential need for insulin at d/c.  He states that "he does not want to go home with the needle".  Briefly discussed diet and exercise and lifestyle modifications.  Also discussed the need for oral medications such as metformin.  Patient verbalized understanding.  Will order LWWD booklet as well.    Thanks,  Adah Perl, RN, BC-ADM Inpatient Diabetes Coordinator Pager [phone] (8a-5p)

## 2021-05-08 NOTE — Progress Notes (Signed)
ANTICOAGULATION CONSULT NOTE - Follow Up Consult  Pharmacy Consult for Heparin Indication: DVT and PE  No Known Allergies  Patient Measurements: Height: 5\' 10"  (177.8 cm) Weight: 95.2 kg (209 lb 14.1 oz) IBW/kg (Calculated) : 73 Heparin Dosing Weight: 92.3 kg  Vital Signs: Temp: 98.3 F (36.8 C) (05/27 0337) Temp Source: Oral (05/27 0337) BP: 102/77 (05/27 0337) Pulse Rate: 95 (05/27 0337)  Labs: Recent Labs    05/06/21 1127 05/06/21 1127 05/06/21 1327 05/07/21 0231 05/07/21 1000 05/07/21 1551 05/08/21 0353  HGB 15.2  --   --  14.6  --   --  13.9  HCT 48.6  --   --  46.4  --   --  43.4  PLT 197  --   --  198  --   --  168  HEPARINUNFRC  --    < >  --  0.18* 0.46 0.37 0.29*  CREATININE 1.83*  --   --  1.90*  --   --  1.56*  TROPONINIHS 59*  --  48*  --   --   --   --    < > = values in this interval not displayed.    Estimated Creatinine Clearance: 59.1 mL/min (A) (by C-G formula based on SCr of 1.56 mg/dL (H)).   Assessment:  Anticoag: Acute PE + DVT - started on IV heparin  - Thoracentesis 5/26 PM 1162ml for pleural effusion  Heparin level down to slightly subtherapeutic (0.19) on gtt at 1900 units/hr. No issues with line or bleeding reported per RN.   Goal of Therapy:  Heparin level 0.3-0.7 units/ml Monitor platelets by anticoagulation protocol: Yes   Plan:  Increase heparin gtt to 2050 units/hr F/u 6 hr heparin level  Sherlon Handing, PharmD, BCPS Please see amion for complete clinical pharmacist phone list 05/08/2021,5:07 AM

## 2021-05-09 LAB — BASIC METABOLIC PANEL
Anion gap: 10 (ref 5–15)
BUN: 25 mg/dL — ABNORMAL HIGH (ref 6–20)
CO2: 23 mmol/L (ref 22–32)
Calcium: 8.2 mg/dL — ABNORMAL LOW (ref 8.9–10.3)
Chloride: 100 mmol/L (ref 98–111)
Creatinine, Ser: 1.53 mg/dL — ABNORMAL HIGH (ref 0.61–1.24)
GFR, Estimated: 52 mL/min — ABNORMAL LOW (ref >60)
Glucose, Bld: 148 mg/dL — ABNORMAL HIGH (ref 70–99)
Potassium: 3.7 mmol/L (ref 3.5–5.1)
Sodium: 133 mmol/L — ABNORMAL LOW (ref 135–145)

## 2021-05-09 LAB — CBC
HCT: 41.8 % (ref 39.0–52.0)
Hemoglobin: 13.3 g/dL (ref 13.0–17.0)
MCH: 27.7 pg (ref 26.0–34.0)
MCHC: 31.8 g/dL (ref 30.0–36.0)
MCV: 87.1 fL (ref 80.0–100.0)
Platelets: 167 10*3/uL (ref 150–400)
RBC: 4.8 MIL/uL (ref 4.22–5.81)
RDW: 14.2 % (ref 11.5–15.5)
WBC: 6.3 10*3/uL (ref 4.0–10.5)
nRBC: 0 % (ref 0.0–0.2)

## 2021-05-09 LAB — GLUCOSE, CAPILLARY: Glucose-Capillary: 141 mg/dL — ABNORMAL HIGH (ref 70–99)

## 2021-05-09 MED ORDER — DIGOXIN 125 MCG PO TABS
0.1250 mg | ORAL_TABLET | Freq: Every day | ORAL | 0 refills | Status: DC
  Filled 2021-05-09: qty 30, 30d supply, fill #0

## 2021-05-09 MED ORDER — POTASSIUM CHLORIDE CRYS ER 20 MEQ PO TBCR
40.0000 meq | EXTENDED_RELEASE_TABLET | Freq: Once | ORAL | Status: AC
  Administered 2021-05-09: 40 meq via ORAL
  Filled 2021-05-09: qty 2

## 2021-05-09 MED ORDER — BLOOD GLUCOSE MONITOR KIT: PACK | 0 refills | Status: DC

## 2021-05-09 MED ORDER — SPIRONOLACTONE 25 MG PO TABS: 25.0000 mg | ORAL_TABLET | Freq: Every day | ORAL | 1 refills | Status: DC

## 2021-05-09 MED ORDER — APIXABAN 5 MG PO TABS
5.0000 mg | ORAL_TABLET | Freq: Two times a day (BID) | ORAL | 0 refills | Status: DC
  Filled 2021-05-09: qty 60, 30d supply, fill #0

## 2021-05-09 MED ORDER — ENTRESTO 49-51 MG PO TABS: 1.0000 | ORAL_TABLET | Freq: Two times a day (BID) | ORAL | 1 refills | Status: DC

## 2021-05-09 MED ORDER — SPIRONOLACTONE 25 MG PO TABS
25.0000 mg | ORAL_TABLET | Freq: Every day | ORAL | 11 refills | Status: DC
  Filled 2021-05-09: qty 30, 30d supply, fill #0

## 2021-05-09 MED ORDER — FUROSEMIDE 40 MG PO TABS: 40.0000 mg | ORAL_TABLET | Freq: Two times a day (BID) | ORAL | 1 refills | Status: DC

## 2021-05-09 MED ORDER — METFORMIN HCL 500 MG PO TABS: 500.0000 mg | ORAL_TABLET | Freq: Two times a day (BID) | ORAL | 1 refills | Status: DC

## 2021-05-09 MED ORDER — CARVEDILOL 3.125 MG PO TABS: 3.1250 mg | ORAL_TABLET | Freq: Two times a day (BID) | ORAL | 1 refills | Status: DC

## 2021-05-09 MED ORDER — APIXABAN 5 MG PO TABS: 5.0000 mg | ORAL_TABLET | Freq: Two times a day (BID) | ORAL | 0 refills | Status: DC

## 2021-05-09 MED ORDER — CARVEDILOL 3.125 MG PO TABS
3.1250 mg | ORAL_TABLET | Freq: Two times a day (BID) | ORAL | 0 refills | Status: DC
Start: 2021-05-09 — End: 2021-05-09
  Filled 2021-05-09: qty 60, 30d supply, fill #0

## 2021-05-09 MED ORDER — FUROSEMIDE 40 MG PO TABS
40.0000 mg | ORAL_TABLET | Freq: Two times a day (BID) | ORAL | 0 refills | Status: DC
  Filled 2021-05-09: qty 60, 30d supply, fill #0

## 2021-05-09 MED ORDER — EMPAGLIFLOZIN 10 MG PO TABS: 10.0000 mg | ORAL_TABLET | Freq: Every day | ORAL | 1 refills | Status: DC

## 2021-05-09 MED ORDER — APIXABAN 5 MG PO TABS: 10.0000 mg | ORAL_TABLET | Freq: Two times a day (BID) | ORAL | 0 refills | Status: DC

## 2021-05-09 MED ORDER — INSULIN GLARGINE 100 UNIT/ML SOLOSTAR PEN: 10.0000 [IU] | PEN_INJECTOR | Freq: Every day | SUBCUTANEOUS | 0 refills | Status: DC

## 2021-05-09 MED ORDER — ENTRESTO 49-51 MG PO TABS
1.0000 | ORAL_TABLET | Freq: Two times a day (BID) | ORAL | 1 refills | Status: DC
  Filled 2021-05-09: qty 30, 15d supply, fill #0

## 2021-05-09 MED ORDER — INSULIN GLARGINE 100 UNIT/ML SOLOSTAR PEN
10.0000 [IU] | PEN_INJECTOR | Freq: Every day | SUBCUTANEOUS | 0 refills | Status: DC
  Filled 2021-05-09: qty 15, 150d supply, fill #0

## 2021-05-09 MED ORDER — DIGOXIN 125 MCG PO TABS: 0.1250 mg | ORAL_TABLET | Freq: Every day | ORAL | 1 refills | Status: DC

## 2021-05-09 MED ORDER — APIXABAN 5 MG PO TABS
10.0000 mg | ORAL_TABLET | Freq: Two times a day (BID) | ORAL | 0 refills | Status: DC
  Filled 2021-05-09: qty 24, 6d supply, fill #0

## 2021-05-09 NOTE — TOC Transition Note (Signed)
Transition of Care [facility]) - CM/SW Discharge Note   Patient Details  Name: Kurt Woodard MRN: [id number] Date of Birth: 1961-09-05  Transition of Care [facility]) CM/SW Contact:  Konrad Penta, RN Phone Number: [phone] 05/09/2021, 1:20 PM   Clinical Narrative:   Made aware by MD that patient will need [place] letter in preparation for hospital transition to home. Provided [place] letter and Eliquis card to Mr. Sattler at bedside. Reminded him of appointment with [facility] for PCP follow up.       Barriers to Discharge: Continued Medical Work up   Patient Goals and CMS Choice        Discharge Placement                       Discharge Plan and Services In-house Referral: Clinical Social Work                                   Social Determinants of Health (SDOH) Interventions Food Insecurity Interventions: Assist with [employer] Application Financial Strain Interventions: Other (Comment) (provided patient with CAFA application & Food Stamp referral) Housing Interventions: Intervention Not Indicated Transportation Interventions: Intervention Not Indicated   Readmission Risk Interventions No flowsheet data found.   Marthenia Rolling, MSN, RN,BSN Inpatient [facility] Case Manager [phone]

## 2021-05-09 NOTE — Progress Notes (Signed)
Patient states he is going home tomorrow because he is not staying here anther day. Says he has his car and he will drive himself home.  RN noticed that he had an unreleased order for a cardiac MRI, RN released order and called MRI team. MRI team states that they will not be able to do it tonight and no one is here to do it over the weekend. The said it can be done outpatient if he will not be here on Monday.

## 2021-05-09 NOTE — Progress Notes (Signed)
Discharge instructions given. Patient verbalized understanding and all questions were answered.  ?

## 2021-05-09 NOTE — Discharge Summary (Addendum)
[facility] Discharge Summary  Patient name: Kurt Woodard Medical record number: [id number] Date of birth: 05-26-1961 Age: 60 y.o. Gender: male Date of Admission: 05/06/2021  Date of Discharge: 05/09/21 Admitting Physician: Dickie La, MD  Primary Care Provider: Patient, No Pcp Per (Inactive) Consultants: Cardiology  Indication for Hospitalization: Pulmonary Emboli  Discharge Diagnoses/Problem List:  Active Problems:   Pulmonary emboli (HCC)   Acute on chronic systolic congestive heart failure ([place])   Type 2 diabetes mellitus with hyperglycemia, without long-term current use of insulin (HCC)   S/P thoracentesis  Disposition: Home  Discharge Condition: Stable  Discharge Exam:  Blood pressure 100/60, pulse 88, temperature 98.1 F (36.7 C), temperature source Oral, resp. rate 20, height _0  (1.778 m), weight 96.2 kg, SpO2 99 %. Gen: Awake, alert, well appearing, in no acute distress Cardio: RRR, without murmur Resp: inspiratory and expiratory rhonchi, breathing comfortably on room air, without respiratory distress Extremities: LLE > RLE. 3+ pitting edema b/l extremities to knee, mild pain on palpation of LLE       Brief Hospital Course:  Kurt Woodard is a 60 y.o. male who presented with left-sided chest pain, found to have subsegmental pulmonary emboli. Reported PMH is only significant for CHF.  Subsegmental pulmonary emboli Patient was tachycardic to 110s, tachypneic to 20s and hypertensive to 409W systolic in the ED prior to admission.  Reassuringly, he was saturating well on room air.  ED work-up notable for negative DVT ultrasound.  CTA with evidence of left lower lobe emboli and left upper lobe emboli segmental and subsegmental level.  Troponins flat at 59 and 48.  EKG without ST segment abnormalities.  Patient was started on heparin gtt.  Echocardiogram demonstrated no LV thrombus but severely reduced EF at 10-15%, more info below.  Patient  remained hemodynamically stable throughout admission and did not require any supplemental respiratory support.  Was able to be transitioned to apixaban on 5/27 after right heart cath performed.  Discharged to continue 6 days of 10 mg BID Abixaban and then transition to 5 mg BID.  Acute on Chronic CHF Exacerabation BNP 961.5. Examination notable for pitting edema, pulmonary edema on CXR. Cardiology consulted. Patient diuresed initially with IV Lasix 80 BID. Given slight creatinine bump the following day without much output, further diuresis was held. Echo showed EF of 10-15% with global hypokinesis, moderate to severe mitral and tricuspid regurgitation and dilated IVC. Patient underwent R/L heart cath on 5/27 which showed minimal CAD, severe non-ischemic cardiomyopathy, mild PAH and mildly elevated filling pressure with relatively preserved CO. Was unfortunately unable to get cardiac MRI in the hospital, recommended for follow up outpatient. Continued on digoxin and entresto during hospitalization. Restarted on diuretics of Lasix and Spironolactone prior to d/c.   Acute Kidney Injury on CKD  Creatinine 1.83 on admission. Was briefly started on diuresis with IV Lasix 80 BID but patient without significant UOP and with slight bump in creatinine. Diuresis stopped after 1-day and creatinine improved. Creatinine 1.53 on d/c.   Chronic Right Pleural Effusion Patient had 1100 cc of serosanguinous fluid drained by thoracentesis on 5/26. Fluid LDH slightly increased at 56, Pleural fluid Glucose 228, Fluid Amylase 12, fluid protein <3. Cytology without WBC or organisms seen. Fluid culture without growth x1 day.  Transudative process. Pulmonology felt this was most likely caused by asymmetric chronic volume overload in the setting of mitral valve regurgitation.   Subcarinal Lymph Nodes CTA demonstrated subcarinal lymph nodes that could be reactive. Also  with persistent airspace in the right lung base and nodal  enlargement and repeat CT assessment in 1-month with pulmonology follow up for further assessment.   Poorly Controlled T2DM Hgb A1c 11.6. Not previously on home medications. CBG's monitored and patient was on Lantus 10U and moderate SSI.  Discharged with Jardiance 10 mg daily and Metformin 500 mg BID. Patient was not amenable to starting Lantus on discharge.   Follow-up recommendations 1. Ensure that patient has age-appropriate cancer screening i.e. colonoscopy 2. Needs repeat CT in 3 months and pulmology follow up for further assessment of subcarinal lymph nodes and persistent airspace in right long base 3. Will need to follow up with cardiology for his severe HFrEF 4. Consider doing outpatient [place]. Declined starting insulin, was strongly recommended to start given his uncontrolled diabetes. 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 Discharged with Jardiance 10 mg daily and Metformin 500 mg BID. Metformin limit should be 1500 mg with his GFR.   Significant Procedures: R/L Heart Cath 5/27  Significant Labs and Imaging:  Recent Labs  Lab 05/07/21 0231 05/08/21 0353 05/08/21 1040 05/08/21 1044 05/08/21 1045 05/09/21 0558  WBC 8.2 12.7*  --   --   --  6.3  HGB 14.6 13.9   < > 15.0 14.3 13.3  HCT 46.4 43.4   < > 44.0 42.0 41.8  PLT 198 168  --   --   --  167   < > = values in this interval not displayed.   Recent Labs  Lab 05/06/21 1127 05/06/21 1940 05/07/21 0231 05/08/21 0353 05/08/21 1040 05/08/21 1044 05/08/21 1045 05/08/21 1858 05/09/21 0558  NA 135  --  135 133* 139 137 139 134* 133*  K 3.0*  --  3.7 3.6 3.1* 3.3* 3.0* 3.5 3.7  CL 97*  --  97* 101  --   --   --  96* 100  CO2 26  --  27 23  --   --   --  28 23  GLUCOSE 327*  --  232* 113*  --   --   --  203* 148*  BUN 18  --  19 26*  --   --   --  25* 25*  CREATININE 1.83*  --  1.90* 1.56*  --   --   --  1.77* 1.53*  CALCIUM 8.6*  --  8.4* 8.4*  --   --   --  8.4* 8.2*  MG  --   1.5*  --   --   --   --   --   --   --   ALKPHOS 80  --  86  --   --   --   --   --   --   AST 16  --  18  --   --   --   --   --   --   ALT 16  --  16  --   --   --   --   --   --   ALBUMIN 3.5  --  3.2*  --   --   --   --   --   --      Results/Tests Pending at Time of Discharge: Final fluid culture from thoracentesis  Discharge Medications:  Allergies as of 05/09/2021   No Known Allergies     Medication List    STOP taking these medications   carvedilol 3.125  MG tablet Commonly known as: COREG   guaiFENesin 600 MG 12 hr tablet Commonly known as: Mucinex     TAKE these medications   apixaban 5 MG Tabs tablet Commonly known as: ELIQUIS Take 2 tablets (10 mg total) by mouth 2 (two) times daily for 6 days. Take 2 tablets twice a day until 6/2   apixaban 5 MG Tabs tablet Commonly known as: ELIQUIS Take 1 tablet (5 mg total) by mouth 2 (two) times daily. Start this on June 3rd. Start taking on: May 15, 2021   blood glucose meter kit and supplies Kit Dispense based on patient and insurance preference. Use up to four times daily as directed.   digoxin 0.125 MG tablet Commonly known as: LANOXIN Take 1 tablet (0.125 mg total) by mouth daily.   empagliflozin 10 MG Tabs tablet Commonly known as: Jardiance Take 1 tablet (10 mg total) by mouth daily before breakfast.   Entresto 49-51 MG Generic drug: sacubitril-valsartan Take 1 tablet by mouth 2 (two) times daily. Must keep further appointments for refills   furosemide 40 MG tablet Commonly known as: LASIX Take 1 tablet (40 mg total) by mouth 2 (two) times daily.   metFORMIN 500 MG tablet Commonly known as: Glucophage Take 1 tablet (500 mg total) by mouth 2 (two) times daily with a meal.   spironolactone 25 MG tablet Commonly known as: Aldactone Take 1 tablet (25 mg total) by mouth daily.       Discharge Instructions: Please refer to Patient Instructions section of EMR for full details.  Patient was counseled  important signs and symptoms that should prompt return to medical care, changes in medications, dietary instructions, activity restrictions, and follow up appointments.   Follow-Up Appointments:  Follow-up Information    [facility] Go on 06/17/2021.   Why: _0 :30am Contact information: [place] [phone]       Bensimhon, Shaune Pascal, MD. Schedule an appointment as soon as possible for a visit in 1 week(s).   Specialty: Cardiology Why: Their office should call you for an appointment, if you do not hear from them within a week please call to schedule an appointment for follow-up. Contact information: [place] [address] [phone]               Sharion Settler, [place] 05/09/2021, 2:37 PM PGY-1, [place]

## 2021-05-09 NOTE — Progress Notes (Signed)
OT Cancellation Note and Discharge  Patient Details Name: Kurt Woodard MRN: [id number] DOB: 28-Jul-1961   Cancelled Treatment:    Reason Eval/Treat Not Completed: OT screened, no needs identified, will sign off. PT saw pt and informed OT that pt was independent with all functional mobility and ADL performance. He does not need OT at this time and Acute OT will discharge pt. Thank you for the referral.   Bennet Kujawa H., OTR/L [place] 05/09/2021, 10:43 AM

## 2021-05-09 NOTE — Progress Notes (Signed)
Advanced Heart Failure Rounding Note  PCP-Cardiologist: None    Subjective   Feels good. Denies CP or SOB. Creatinine stable after cath  Objective:   Weight Range: 96.2 kg Body mass index is 30.43 kg/m.   Vital Signs:   Temp:  [97.8 F (36.6 C)-98.3 F (36.8 C)] 98.1 F (36.7 C) (05/28 0546) Pulse Rate:  [88-101] 88 (05/28 0546) Resp:  [16-20] 20 (05/28 0546) BP: (100-120)/(60-88) 100/60 (05/28 0546) SpO2:  [95 %-100 %] 99 % (05/28 0546) Weight:  [96.2 kg] 96.2 kg (05/28 0128) Last BM Date: 05/08/21  Weight change: Filed Weights   05/07/21 0500 05/08/21 0230 05/09/21 0128  Weight: 94.9 kg 95.2 kg 96.2 kg    Intake/Output:   Intake/Output Summary (Last 24 hours) at 05/09/2021 1100 Last data filed at 05/09/2021 0400 Gross per 24 hour  Intake 1020 ml  Output 950 ml  Net 70 ml      Physical Exam    General:  Well appearing. No resp difficulty HEENT: normal Neck: supple. no JVD. Carotids 2+ bilat; no bruits. No lymphadenopathy or thryomegaly appreciated. Cor: PMI nondisplaced. Regular rate & rhythm. No rubs, gallops or murmurs. Lungs: clear Abdomen: soft, nontender, nondistended. No hepatosplenomegaly. No bruits or masses. Good bowel sounds. Extremities: no cyanosis, clubbing, rash, edema Neuro: alert & orientedx3, cranial nerves grossly intact. moves all 4 extremities w/o difficulty. Affect pleasant   Telemetry   NSR - sinus 80-90s Personally reviewed   Labs    CBC Recent Labs    05/08/21 0353 05/08/21 1040 05/08/21 1045 05/09/21 0558  WBC 12.7*  --   --  6.3  HGB 13.9   < > 14.3 13.3  HCT 43.4   < > 42.0 41.8  MCV 87.1  --   --  87.1  PLT 168  --   --  167   < > = values in this interval not displayed.   Basic Metabolic Panel Recent Labs    05/06/21 1940 05/07/21 0231 05/08/21 1858 05/09/21 0558  NA  --    < > 134* 133*  K  --    < > 3.5 3.7  CL  --    < > 96* 100  CO2  --    < > 28 23  GLUCOSE  --    < > 203* 148*  BUN  --     < > 25* 25*  CREATININE  --    < > 1.77* 1.53*  CALCIUM  --    < > 8.4* 8.2*  MG 1.5*  --   --   --    < > = values in this interval not displayed.   Liver Function Tests Recent Labs    05/06/21 1127 05/07/21 0231  AST 16 18  ALT 16 16  ALKPHOS 80 86  BILITOT 2.7* 2.4*  PROT 6.7 6.4*  ALBUMIN 3.5 3.2*   Recent Labs    05/06/21 1127  LIPASE 26   Cardiac Enzymes No results for input(s): CKTOTAL, CKMB, CKMBINDEX, TROPONINI in the last 72 hours.  BNP: BNP (last 3 results) Recent Labs    05/06/21 1127  BNP 961.5*    ProBNP (last 3 results) No results for input(s): PROBNP in the last 8760 hours.   D-Dimer No results for input(s): DDIMER in the last 72 hours. Hemoglobin A1C Recent Labs    05/07/21 0231  HGBA1C 11.6*   Fasting Lipid Panel Recent Labs    05/06/21 1940  CHOL 144  HDL 27*  LDLCALC 97  TRIG 102  CHOLHDL 5.3   Thyroid Function Tests Recent Labs    05/07/21 0231  TSH 2.149    Other results:   Imaging    No results found.   Medications:     Scheduled Medications: . apixaban  10 mg Oral BID   Followed by  . [START ON 05/15/2021] apixaban  5 mg Oral BID  . digoxin  0.125 mg Oral Daily  . insulin aspart  0-15 Units Subcutaneous TID WC & HS  . insulin glargine  10 Units Subcutaneous Daily  . sacubitril-valsartan  1 tablet Oral BID  . sodium chloride flush  3 mL Intravenous Q12H  . sodium chloride flush  3 mL Intravenous Q12H    Infusions: . sodium chloride      PRN Medications: sodium chloride, acetaminophen, benzonatate, guaiFENesin-dextromethorphan, ondansetron (ZOFRAN) IV, polyethylene glycol, sodium chloride flush     Assessment/Plan   1. Acute on chronic systolic HF with R>>L symptoms -Suspect HTN CM  - cath 5/27 with minimal CAD. severe NICM. Hemodynamics much better than expected. . - Echo EF 15% RV severely HK - He has surprisingly mild symptoms (R>L) and compensated hemodynmaics in the face of severe  biventricular cardiomyopathy but remains quite tachycardic - Continue Entresto 49-51 bid - Restart spiro and po lasix - Continue digoxin 0.125 mg daily   2. HTN, uncontrolled - improved  3. Acute RLL subsegmental PE - u/s LEs  negative for DVT - On apixaban for PE  4. Large R pleural effusion - s/p tap   5. 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 Elevated blood glucose - likely new DM2 diagnosis.  - per FTPS.  HgBa1c 11.6  7. Elevated bilirubin - suspect related to RV failure  Ok for d/c today on following HF meds.  Entresto 49/51 bid  Digoxin 0.125 daily Spiro 12.5 daily (new) Lasix 40 bid Apixaban   Hold carvedilol for now. Will f/u in HF Clinic. Can get cMRI as outpatient    Length of Stay: 2  Glori Bickers, MD  05/09/2021, 11:00 AM  Advanced Heart Failure Team Pager [phone] (M-F; 7a - 5p)  Please contact [facility] for night-coverage after hours (5p -7a ) and weekends on [email or web address]

## 2021-05-09 NOTE — Plan of Care (Signed)

## 2021-05-09 NOTE — Evaluation (Signed)
Physical Therapy Evaluation Patient Details Name: Kurt Woodard MRN: [id number] DOB: 07-Feb-1961 Today's Date: 05/09/2021   History of Present Illness  Kurt Woodard is a 60 y.o. male presenting with left-sided chest pain 05/06/21, found to have subsegmental pulmonary emboli. Reported PMH is only significant for CHF. Pt is without a PCP; Workup also revealing AKI on chronic CKD, hypokalemia, elevated glucose, elevated bilirubin, wnlarged lymph nodes.  Clinical Impression  Patient evaluated by Physical Therapy with no further acute PT needs identified. All education has been completed and the patient has no further questions. Tells me getting a Primary Care Physician will be high priority, as well as hetting a new pair of better-fitting shoes See below for any follow-up Physical Therapy or equipment needs. PT is signing off. Thank you for this referral.     Follow Up Recommendations No PT follow up    Equipment Recommendations  None recommended by PT    Recommendations for Other Services       Precautions / Restrictions Precautions Precautions: None      Mobility  Bed Mobility                    Transfers Overall transfer level: Independent Equipment used: None             General transfer comment: no difficulty  Ambulation/Gait Ambulation/Gait assistance: Independent Gait Distance (Feet): 320 Feet Assistive device: None Gait Pattern/deviations: WFL(Within Functional Limits) Gait velocity: appraching WNL   General Gait Details: Cues to self-monitor for activity tolerance; no difficulty  Stairs Stairs: Yes Stairs assistance: Independent;Modified independent (Device/Increase time) Stair Management: One rail Right;Forwards (alternating ascending; one at a time descending, leading with L foot) Number of Stairs: 4 General stair comments: Overall no difficulty; noted taking steps one at a time descending with L foot leading, and pt reported soem L foot  discomfort, which he attributes to ill-fitting shoes; He tellsm me he will get new, better-fitting shoes ASAP  Wheelchair Mobility    Modified Rankin (Stroke Patients Only)       Balance Overall balance assessment: Independent                                           Pertinent Vitals/Pain Pain Assessment: No/denies pain    Home Living Family/patient expects to be discharged to:: Private residence Living Arrangements: Alone Available Help at Discharge: Available PRN/intermittently Type of Home: House Home Access: Stairs to enter Entrance Stairs-Rails: Right Entrance Stairs-Number of Steps: 4 Home Layout: One level        Prior Function Level of Independence: Independent         Comments: 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, newspaper        Extremity/Trunk Assessment   Upper Extremity Assessment Upper Extremity Assessment: Overall WFL for tasks assessed    Lower Extremity Assessment Lower Extremity Assessment: Overall WFL for tasks assessed    Cervical / Trunk Assessment Cervical / Trunk Assessment: Normal  Communication   Communication: No difficulties  Cognition Arousal/Alertness: Awake/alert Behavior During Therapy: WFL for tasks assessed/performed Overall Cognitive Status: Within Functional Limits for tasks assessed                                        General Comments General  comments (skin integrity, edema, etc.): HR 97 initially; 107 at end of walk (including stairs)    Exercises     Assessment/Plan    PT Assessment Patent does not need any further PT services  PT Problem List         PT Treatment Interventions      PT Goals (Current goals can be found in the Care Plan section)  Acute Rehab PT Goals Patient Stated Goal: Home today; really wanted to get to the antique car show today PT Goal Formulation: All assessment and education complete, DC therapy    Frequency     Barriers to  discharge        Co-evaluation               AM-PAC PT "6 Clicks" Mobility  Outcome Measure Help needed turning from your back to your side while in a flat bed without using bedrails?: None Help needed moving from lying on your back to sitting on the side of a flat bed without using bedrails?: None Help needed moving to and from a bed to a chair (including a wheelchair)?: None Help needed standing up from a chair using your arms (e.g., wheelchair or bedside chair)?: None Help needed to walk in hospital room?: None Help needed climbing 3-5 steps with a railing? : None 6 Click Score: 24    End of Session   Activity Tolerance: Patient tolerated treatment well Patient left: in bed;Other (comment) (sitting EOB, managing independently in room) Nurse Communication: Mobility status (and ok for dc home from PT standpoint) PT Visit Diagnosis: Pain Pain - Right/Left: Left Pain - part of body:  (L posterolateral chest pain)    Time: 3151-7616 PT Time Calculation (min) (ACUTE ONLY): 16 min   Charges:   PT Evaluation $PT Eval Low Complexity: [place], PT  Acute Rehabilitation Services Pager [phone] Office [phone]   Colletta [place] 05/09/2021, 8:41 AM

## 2021-05-09 NOTE — Progress Notes (Signed)
Discussed with [name], regarding patient's need for financial assistance with all of his discharge medications (new medications and his home medications that he ran out of).  States that she will pass on this information to the nurse case manager.  Appreciate their assistance, we are hoping to discharge this patient today.

## 2021-05-09 NOTE — Discharge Instructions (Signed)
Dear Kurt Woodard,  Thank you for letting us participate in your care. You were hospitalized for left-sided chest pain, and found to have blood clots in your lungs.  We have started you on a medication to help this.  Please continue to take 10 mg of Eliquis twice daily until 6/2.  After this, take 5 mg of Eliquis twice a day.  Also during this hospitalization you were seen by the cardiologist who started you on several medications that help your heart. It is very important that you take these medications daily.  He will need to follow up with the cardiologist for further care.  Additionally, you are found to have diabetes with really elevated blood sugars.  It is important that we control your diabetes as uncontrolled diabetes can have very severe consequences and cause damage to multiple organs including your heart and kidneys.  POST-HOSPITAL & CARE INSTRUCTIONS 1. Take 10 mg of Eliquis twice a day until 6/2 2. Take 5 mg of Eliquis twice a day starting on 6/3 3. Inject 10 units of Lantus into your skin each morning (this is for your diabetes).  They may change to other medications outpatient. 4. Go to your follow up appointments (listed below)   DOCTOR'S APPOINTMENT   Future Appointments  Date Time Provider [place]  06/17/2021  9:30 AM Argentina Donovan, PA-C [facility] None  08/04/2021 11:00 AM [facility] ECHO OP 1 [facility]  08/04/2021 12:00 PM Bensimhon, Shaune Pascal, MD [facility] None    Follow-up Information    [facility]. Go on 06/17/2021.   Why: @9 :30am Contact information: [address] [place] [address] [phone]              Take care and be well!  [facility]  [place], [place] [address] [phone]       Information on my medicine - ELIQUIS (apixaban)  Why was Eliquis prescribed for you? Eliquis was prescribed  to treat blood clots that may have been found in the veins of your legs (deep vein thrombosis) or in your lungs (pulmonary embolism) and to reduce the risk of them occurring again.  What do You need to know about Eliquis ? The starting dose is 10 mg (two 5 mg tablets) taken TWICE daily for the FIRST SEVEN (7) DAYS, then on (enter date)  6/3  the dose is reduced to ONE 5 mg tablet taken TWICE daily.  Eliquis may be taken with or without food.   Try to take the dose about the same time in the morning and in the evening. If you have difficulty swallowing the tablet whole please discuss with your pharmacist how to take the medication safely.  Take Eliquis exactly as prescribed and DO NOT stop taking Eliquis without talking to the doctor who prescribed the medication.  Stopping may increase your risk of developing a new blood clot.  Refill your prescription before you run out.  After discharge, you should have regular check-up appointments with your healthcare provider that is prescribing your Eliquis.    What do you do if you miss a dose? If a dose of ELIQUIS is not taken at the scheduled time, take it as soon as possible on the same day and twice-daily administration should be resumed. The dose should not be doubled to make up for a missed dose.  Important Safety Information A possible side effect  of Eliquis is bleeding. You should call your healthcare provider right away if you experience any of the following: ? Bleeding from an injury or your nose that does not stop. ? Unusual colored urine (red or dark brown) or unusual colored stools (red or black). ? Unusual bruising for unknown reasons. ? A serious fall or if you hit your head (even if there is no bleeding).  Some medicines may interact with Eliquis and might increase your risk of bleeding or clotting while on Eliquis. To help avoid this, consult your healthcare provider or pharmacist prior to using any new prescription or  non-prescription medications, including herbals, vitamins, non-steroidal anti-inflammatory drugs (NSAIDs) and supplements.  This website has more information on Eliquis (apixaban): http://www.eliquis.com/eliquis/home

## 2021-05-11 LAB — BODY FLUID CULTURE W GRAM STAIN
Culture: NO GROWTH
Gram Stain: NONE SEEN

## 2021-05-12 ENCOUNTER — Other Ambulatory Visit (HOSPITAL_COMMUNITY): Payer: Self-pay

## 2021-06-17 ENCOUNTER — Ambulatory Visit: Payer: Self-pay | Attending: Physician Assistant | Admitting: Physician Assistant

## 2021-06-17 ENCOUNTER — Other Ambulatory Visit: Payer: Self-pay

## 2021-06-17 ENCOUNTER — Encounter: Payer: Self-pay | Admitting: Physician Assistant

## 2021-06-17 VITALS — BP 129/88 | HR 100 | Resp 16 | Ht 70.0 in | Wt 197.2 lb

## 2021-06-17 DIAGNOSIS — I2699 Other pulmonary embolism without acute cor pulmonale: Secondary | ICD-10-CM

## 2021-06-17 DIAGNOSIS — Z9119 Patient's noncompliance with other medical treatment and regimen: Secondary | ICD-10-CM

## 2021-06-17 DIAGNOSIS — Z09 Encounter for follow-up examination after completed treatment for conditions other than malignant neoplasm: Secondary | ICD-10-CM

## 2021-06-17 DIAGNOSIS — E871 Hypo-osmolality and hyponatremia: Secondary | ICD-10-CM

## 2021-06-17 DIAGNOSIS — Z91199 Patient's noncompliance with other medical treatment and regimen due to unspecified reason: Secondary | ICD-10-CM

## 2021-06-17 DIAGNOSIS — E1165 Type 2 diabetes mellitus with hyperglycemia: Secondary | ICD-10-CM

## 2021-06-17 DIAGNOSIS — Z9889 Other specified postprocedural states: Secondary | ICD-10-CM

## 2021-06-17 DIAGNOSIS — I5023 Acute on chronic systolic (congestive) heart failure: Secondary | ICD-10-CM

## 2021-06-17 LAB — GLUCOSE, POCT (MANUAL RESULT ENTRY): POC Glucose: 272 mg/dl — AB (ref 70–99)

## 2021-06-17 MED ORDER — DIGOXIN 125 MCG PO TABS
0.1250 mg | ORAL_TABLET | Freq: Every day | ORAL | 2 refills | Status: DC
  Filled 2021-06-17: qty 30, 30d supply, fill #0
  Filled 2021-07-21: qty 30, 30d supply, fill #1
  Filled 2021-09-27: qty 30, 30d supply, fill #2

## 2021-06-17 MED ORDER — ENTRESTO 49-51 MG PO TABS
1.0000 | ORAL_TABLET | Freq: Two times a day (BID) | ORAL | 2 refills | Status: DC
  Filled 2021-06-17: qty 60, 30d supply, fill #0
  Filled 2021-07-21: qty 60, 30d supply, fill #1
  Filled 2021-09-27: qty 60, 30d supply, fill #2

## 2021-06-17 MED ORDER — SPIRONOLACTONE 25 MG PO TABS
25.0000 mg | ORAL_TABLET | Freq: Every day | ORAL | 2 refills | Status: DC
  Filled 2021-06-17: qty 30, 30d supply, fill #0
  Filled 2021-07-21: qty 30, 30d supply, fill #1
  Filled 2021-09-27: qty 30, 30d supply, fill #2

## 2021-06-17 MED ORDER — APIXABAN 5 MG PO TABS
5.0000 mg | ORAL_TABLET | Freq: Two times a day (BID) | ORAL | 2 refills | Status: DC
  Filled 2021-06-17: qty 60, 30d supply, fill #0
  Filled 2021-07-21: qty 60, 30d supply, fill #1
  Filled 2021-09-27: qty 60, 30d supply, fill #2

## 2021-06-17 MED ORDER — GLIPIZIDE 10 MG PO TABS
10.0000 mg | ORAL_TABLET | Freq: Two times a day (BID) | ORAL | 3 refills | Status: DC
  Filled 2021-06-17: qty 60, 30d supply, fill #0
  Filled 2021-07-21: qty 60, 30d supply, fill #1
  Filled 2021-09-27: qty 60, 30d supply, fill #2
  Filled 2022-02-01: qty 60, 30d supply, fill #3
  Filled 2022-02-02: qty 60, 30d supply, fill #0

## 2021-06-17 MED ORDER — FUROSEMIDE 40 MG PO TABS
40.0000 mg | ORAL_TABLET | Freq: Two times a day (BID) | ORAL | 2 refills | Status: DC
  Filled 2021-06-17: qty 60, 30d supply, fill #0
  Filled 2021-07-21: qty 60, 30d supply, fill #1
  Filled 2021-09-27: qty 60, 30d supply, fill #2

## 2021-06-17 NOTE — Progress Notes (Signed)
Patient ID: Kurt Woodard, male   DOB: [birth year], 60 y.o.   MRN: [id number]    Kurt Woodard, is a 60 y.o. male  QAS:[id number]  NLG:[id number]  DOB - 1961-01-05  Subjective:  Chief Complaint and HPI: Kurt Woodard is a 60 y.o. male here today to establish care and for a follow up visit After hospitalization 5/25-5/28/2022 for PE.  He is not taking jardiance bc it was too expensive.  He is not taking metformin bc it upset his stomach.  He is out of most of his meds.  He denies SOB or CP.  Weights stable.  Not checking blood sugars bc "I don't know how to work it."  His son is going to help him with this.  He does not have insurance.  He did not bring his meter with him.    From discharge summary: Indication for Hospitalization: Pulmonary Emboli   Discharge Diagnoses/Problem List:  Active Problems:   Pulmonary emboli (HCC)   Acute on chronic systolic congestive heart failure (HCC)   Type 2 diabetes mellitus with hyperglycemia, without long-term current use of insulin (HCC)   S/P thoracentesis   Brief Hospital Course:  Kurt Woodard is a 60 y.o. male who presented with left-sided chest pain, found to have subsegmental pulmonary emboli. Reported PMH is only significant for CHF.   Subsegmental pulmonary emboli Patient was tachycardic to 110s, tachypneic to 20s and hypertensive to 081K systolic in the ED prior to admission.  Reassuringly, he was saturating well on room air.  ED work-up notable for negative DVT ultrasound.  CTA with evidence of left lower lobe emboli and left upper lobe emboli segmental and subsegmental level.  Troponins flat at 59 and 48.  EKG without ST segment abnormalities.  Patient was started on heparin gtt.  Echocardiogram demonstrated no LV thrombus but severely reduced EF at 10-15%, more info below.  Patient remained hemodynamically stable throughout admission and did not require any supplemental respiratory support.  Was able to be transitioned to apixaban on 5/27  after right heart cath performed.  Discharged to continue 6 days of 10 mg BID Abixaban and then transition to 5 mg BID.   Acute on Chronic CHF Exacerabation BNP 961.5. Examination notable for pitting edema, pulmonary edema on CXR. Cardiology consulted. Patient diuresed initially with IV Lasix 80 BID. Given slight creatinine bump the following day without much output, further diuresis was held. Echo showed EF of 10-15% with global hypokinesis, moderate to severe mitral and tricuspid regurgitation and dilated IVC. Patient underwent R/L heart cath on 5/27 which showed minimal CAD, severe non-ischemic cardiomyopathy, mild PAH and mildly elevated filling pressure with relatively preserved CO. Was unfortunately unable to get cardiac MRI in the hospital, recommended for follow up outpatient. Continued on digoxin and entresto during hospitalization. Restarted on diuretics of Lasix and Spironolactone prior to d/c.   Acute Kidney Injury on CKD Creatinine 1.83 on admission. Was briefly started on diuresis with IV Lasix 80 BID but patient without significant UOP and with slight bump in creatinine. Diuresis stopped after 1-day and creatinine improved. Creatinine 1.53 on d/c.   Chronic Right Pleural Effusion Patient had 1100 cc of serosanguinous fluid drained by thoracentesis on 5/26. Fluid LDH slightly increased at 56, Pleural fluid Glucose 228, Fluid Amylase 12, fluid protein <3. Cytology without WBC or organisms seen. Fluid culture without growth x1 day.  Transudative process. Pulmonology felt this was most likely caused by asymmetric chronic volume overload in the setting of mitral valve regurgitation.   Subcarinal  Lymph Nodes CTA demonstrated subcarinal lymph nodes that could be reactive. Also with persistent airspace in the right lung base and nodal enlargement and repeat CT assessment in 19-month with pulmonology follow up for further assessment.   Poorly Controlled T2DM Hgb A1c 11.6. Not previously on home  medications. CBG's monitored and patient was on Lantus 10U and moderate SSI.  Discharged with Jardiance 10 mg daily and Metformin 500 mg BID. Patient was not amenable to starting Lantus on discharge.   Follow-up recommendations Ensure that patient has age-appropriate cancer screening i.e. colonoscopy Needs repeat CT in 3 months and pulmology follow up for further assessment of subcarinal lymph nodes and persistent airspace in right long base Will need to follow up with cardiology for his severe HFrEF Consider doing outpatient [place], was strongly recommended to start given his uncontrolled diabetes. 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 Discharged with Jardiance 10 mg daily and Metformin 500 mg BID. Metformin limit should be 1500 mg with his GFR.   ED/Hospital notes reviewed.    ROS:   Constitutional:  No f/c, No night sweats, No unexplained weight loss. EENT:  No vision changes, No blurry vision, No hearing changes. No mouth, throat, or ear problems.  Respiratory: No cough, No SOB Cardiac: No CP, no palpitations GI:  No abd pain, No N/V/D. GU: No Urinary s/sx Musculoskeletal: No joint pain Neuro: No headache, no dizziness, no motor weakness.  Skin: No rash Endocrine:  No polydipsia. No polyuria.  Psych: Denies SI/HI  No problems updated.  ALLERGIES: No Known Allergies  PAST MEDICAL HISTORY: Past Medical History:  Diagnosis Date   CHF (congestive heart failure) (HCC)    Chronic congestive heart failure ([place] 08/27/2019   Renal disorder    kidney stones    MEDICATIONS AT HOME: Prior to Admission medications   Medication Sig Start Date End Date Taking? Authorizing Provider  glipiZIDE (GLUCOTROL) 10 MG tablet Take 1 tablet (10 mg total) by mouth 2 (two) times daily before a meal. 06/17/21  Yes Danine Hor M, PA-C  apixaban (ELIQUIS) 5 MG TABS tablet Take 1 tablet (5 mg total) by mouth 2 (two) times daily. 06/17/21    MArgentina Donovan PA-C  blood glucose meter kit and supplies KIT Dispense based on patient and insurance preference. Use up to four times daily as directed. 05/09/21   ESharion Settler DO  digoxin (LANOXIN) 0.125 MG tablet Take 1 tablet (0.125 mg total) by mouth daily. 06/17/21 08/16/21  MArgentina Donovan PA-C  furosemide (LASIX) 40 MG tablet Take 1 tablet (40 mg total) by mouth 2 (two) times daily. 06/17/21   MArgentina Donovan PA-C  sacubitril-valsartan (ENTRESTO) 49-51 MG Take 1 tablet by mouth 2 (two) times daily. Must keep further appointments for refills 06/17/21   MArgentina Donovan PA-C  spironolactone (ALDACTONE) 25 MG tablet Take 1 tablet (25 mg total) by mouth daily. 06/17/21 08/16/21  MArgentina Donovan PA-C     Objective:  EXAM:   Vitals:   06/17/21 0933  BP: 129/88  Pulse: 100  Resp: 16  SpO2: 99%  Weight: 197 lb 3.2 oz (89.4 kg)  Height: 5' 10"  (1.778 m)    General appearance : A&OX3. NAD. Non-toxic-appearing HEENT: Atraumatic and Normocephalic.  PERRLA. EOM intact.  Neck: supple, no JVD. No cervical lymphadenopathy. No thyromegaly Chest/Lungs:  Breathing-non-labored, Good air entry bilaterally, breath sounds normal without rales, rhonchi, or wheezing  CVS: S1 S2 regular, no murmurs,  gallops, rubs  Extremities: Bilateral Lower Ext shows no edema, both legs are warm to touch with = pulse throughout Neurology:  CN II-XII grossly intact, Non focal.   Psych:  TP linear. J/I poor with medical issues Normal speech. Appropriate eye contact and affect.  Skin:  No Rash  Data Review Lab Results  Component Value Date   HGBA1C 11.6 (H) 05/07/2021     Assessment & Plan   1. Type 2 diabetes mellitus with hyperglycemia, without long-term current use of insulin (HCC) Non-compliant-he says son will teach him about meter - Glucose (CBG) - Comprehensive metabolic panel - glipiZIDE (GLUCOTROL) 10 MG tablet; Take 1 tablet (10 mg total) by mouth 2 (two) times daily before a meal.   Dispense: 60 tablet; Refill: 3  2. Acute on chronic systolic congestive heart failure (HCC) Weight is stable - spironolactone (ALDACTONE) 25 MG tablet; Take 1 tablet (25 mg total) by mouth daily.  Dispense: 30 tablet; Refill: 2 - sacubitril-valsartan (ENTRESTO) 49-51 MG; Take 1 tablet by mouth 2 (two) times daily. Must keep further appointments for refills  Dispense: 60 tablet; Refill: 2 - furosemide (LASIX) 40 MG tablet; Take 1 tablet (40 mg total) by mouth 2 (two) times daily.  Dispense: 60 tablet; Refill: 2 - digoxin (LANOXIN) 0.125 MG tablet; Take 1 tablet (0.125 mg total) by mouth daily.  Dispense: 30 tablet; Refill: 2  3. Other pulmonary embolism without acute cor pulmonale, unspecified chronicity ([place]) - Ambulatory referral to Pulmonology - apixaban (ELIQUIS) 5 MG TABS tablet; Take 1 tablet (5 mg total) by mouth 2 (two) times daily.  Dispense: 60 tablet; Refill: 2  4. S/P thoracentesis  - Ambulatory referral to Pulmonology  5. Hospital discharge follow-up  6. Hyponatremia - Comprehensive metabolic panel  7. Noncompliance Compliance imperative-discussed at length.     Patient have been counseled extensively about nutrition and exercise  Return in about 3 weeks (around 07/08/2021) for [facility] in 3 weeks for DM management and 2 months to assign PCP.  The patient was given clear instructions to go to ER or return to medical center if symptoms don't improve, worsen or new problems develop. The patient verbalized understanding. The patient was told to call to get lab results if they haven't heard anything in the next week.     Freeman Caldron, PA-C [facility] and [facility], [place]   06/17/2021, 9:52 AM

## 2021-06-17 NOTE — Patient Instructions (Signed)
Check blood sugars fasting and bedtime and record and bring to next visit 

## 2021-06-18 LAB — COMPREHENSIVE METABOLIC PANEL
ALT: 18 IU/L (ref 0–44)
AST: 14 IU/L (ref 0–40)
Albumin/Globulin Ratio: 1.4 (ref 1.2–2.2)
Albumin: 4.6 g/dL (ref 3.8–4.9)
Alkaline Phosphatase: 84 IU/L (ref 44–121)
BUN/Creatinine Ratio: 19 (ref 9–20)
BUN: 28 mg/dL — ABNORMAL HIGH (ref 6–24)
Bilirubin Total: 0.7 mg/dL (ref 0.0–1.2)
CO2: 25 mmol/L (ref 20–29)
Calcium: 10.1 mg/dL (ref 8.7–10.2)
Chloride: 99 mmol/L (ref 96–106)
Creatinine, Ser: 1.44 mg/dL — ABNORMAL HIGH (ref 0.76–1.27)
Globulin, Total: 3.3 g/dL (ref 1.5–4.5)
Glucose: 259 mg/dL — ABNORMAL HIGH (ref 65–99)
Potassium: 3.9 mmol/L (ref 3.5–5.2)
Sodium: 142 mmol/L (ref 134–144)
Total Protein: 7.9 g/dL (ref 6.0–8.5)
eGFR: 56 mL/min/{1.73_m2} — ABNORMAL LOW (ref >59)

## 2021-07-06 ENCOUNTER — Ambulatory Visit: Payer: Self-pay | Admitting: Pharmacist

## 2021-07-15 ENCOUNTER — Encounter: Payer: Self-pay | Admitting: *Deleted

## 2021-07-22 ENCOUNTER — Other Ambulatory Visit: Payer: Self-pay

## 2021-07-24 ENCOUNTER — Other Ambulatory Visit: Payer: Self-pay

## 2021-08-04 ENCOUNTER — Encounter (HOSPITAL_COMMUNITY): Payer: Self-pay | Admitting: Internal Medicine

## 2021-08-04 ENCOUNTER — Ambulatory Visit (HOSPITAL_COMMUNITY): Payer: Self-pay

## 2021-08-24 ENCOUNTER — Ambulatory Visit: Payer: Self-pay | Admitting: Critical Care Medicine

## 2021-09-28 ENCOUNTER — Other Ambulatory Visit: Payer: Self-pay

## 2021-09-29 ENCOUNTER — Other Ambulatory Visit: Payer: Self-pay

## 2021-10-12 ENCOUNTER — Encounter (HOSPITAL_COMMUNITY): Payer: Self-pay | Admitting: Internal Medicine

## 2021-10-12 ENCOUNTER — Ambulatory Visit (HOSPITAL_BASED_OUTPATIENT_CLINIC_OR_DEPARTMENT_OTHER)
Admission: RE | Admit: 2021-10-12 | Discharge: 2021-10-12 | Disposition: A | Discharge status: Home / Self Care | Payer: Self-pay | Source: Ambulatory Visit | Attending: Internal Medicine | Admitting: Internal Medicine

## 2021-10-12 ENCOUNTER — Other Ambulatory Visit: Payer: Self-pay

## 2021-10-12 ENCOUNTER — Ambulatory Visit (HOSPITAL_COMMUNITY)
Admission: RE | Admit: 2021-10-12 | Discharge: 2021-10-12 | Disposition: A | Discharge status: Home / Self Care | Payer: Self-pay | Source: Ambulatory Visit | Attending: Internal Medicine | Admitting: Internal Medicine

## 2021-10-12 VITALS — BP 160/90 | HR 102 | Wt 215.2 lb

## 2021-10-12 DIAGNOSIS — N1832 Chronic kidney disease, stage 3b: Secondary | ICD-10-CM | POA: Insufficient documentation

## 2021-10-12 DIAGNOSIS — I480 Paroxysmal atrial fibrillation: Secondary | ICD-10-CM

## 2021-10-12 DIAGNOSIS — E1122 Type 2 diabetes mellitus with diabetic chronic kidney disease: Secondary | ICD-10-CM | POA: Insufficient documentation

## 2021-10-12 DIAGNOSIS — I428 Other cardiomyopathies: Secondary | ICD-10-CM | POA: Insufficient documentation

## 2021-10-12 DIAGNOSIS — I5082 Biventricular heart failure: Secondary | ICD-10-CM | POA: Insufficient documentation

## 2021-10-12 DIAGNOSIS — Z7984 Long term (current) use of oral hypoglycemic drugs: Secondary | ICD-10-CM | POA: Insufficient documentation

## 2021-10-12 DIAGNOSIS — Z5941 Food insecurity: Secondary | ICD-10-CM | POA: Insufficient documentation

## 2021-10-12 DIAGNOSIS — I2510 Atherosclerotic heart disease of native coronary artery without angina pectoris: Secondary | ICD-10-CM | POA: Insufficient documentation

## 2021-10-12 DIAGNOSIS — I130 Hypertensive heart and chronic kidney disease with heart failure and stage 1 through stage 4 chronic kidney disease, or unspecified chronic kidney disease: Secondary | ICD-10-CM | POA: Insufficient documentation

## 2021-10-12 DIAGNOSIS — Z7901 Long term (current) use of anticoagulants: Secondary | ICD-10-CM | POA: Insufficient documentation

## 2021-10-12 DIAGNOSIS — Z86711 Personal history of pulmonary embolism: Secondary | ICD-10-CM | POA: Insufficient documentation

## 2021-10-12 DIAGNOSIS — E1165 Type 2 diabetes mellitus with hyperglycemia: Secondary | ICD-10-CM

## 2021-10-12 DIAGNOSIS — I10 Essential (primary) hypertension: Secondary | ICD-10-CM

## 2021-10-12 DIAGNOSIS — I5022 Chronic systolic (congestive) heart failure: Secondary | ICD-10-CM

## 2021-10-12 DIAGNOSIS — Z596 Low income: Secondary | ICD-10-CM | POA: Insufficient documentation

## 2021-10-12 DIAGNOSIS — I080 Rheumatic disorders of both mitral and aortic valves: Secondary | ICD-10-CM | POA: Insufficient documentation

## 2021-10-12 DIAGNOSIS — Z79899 Other long term (current) drug therapy: Secondary | ICD-10-CM | POA: Insufficient documentation

## 2021-10-12 DIAGNOSIS — Z86718 Personal history of other venous thrombosis and embolism: Secondary | ICD-10-CM | POA: Insufficient documentation

## 2021-10-12 LAB — BASIC METABOLIC PANEL
Anion gap: 9 (ref 5–15)
BUN: 19 mg/dL (ref 6–20)
CO2: 28 mmol/L (ref 22–32)
Calcium: 9.6 mg/dL (ref 8.9–10.3)
Chloride: 103 mmol/L (ref 98–111)
Creatinine, Ser: 1.31 mg/dL — ABNORMAL HIGH (ref 0.61–1.24)
GFR, Estimated: >60 mL/min (ref >60)
Glucose, Bld: 158 mg/dL — ABNORMAL HIGH (ref 70–99)
Potassium: 4.5 mmol/L (ref 3.5–5.1)
Sodium: 140 mmol/L (ref 135–145)

## 2021-10-12 LAB — ECHOCARDIOGRAM COMPLETE
AR max vel: 2.31 cm2
AV Area VTI: 2.41 cm2
AV Area mean vel: 2.26 cm2
AV Mean grad: 3.5 mmHg
AV Peak grad: 6 mmHg
Ao pk vel: 1.23 m/s
Area-P 1/2: 5.04 cm2
MV M vel: 5.12 m/s
MV Peak grad: 104.9 mmHg
S' Lateral: 5.9 cm

## 2021-10-12 LAB — HEMOGLOBIN A1C
Hgb A1c MFr Bld: 7.4 % — ABNORMAL HIGH (ref 4.8–5.6)
Mean Plasma Glucose: 165.68 mg/dL

## 2021-10-12 LAB — DIGOXIN LEVEL: Digoxin Level: 0.5 ng/mL — ABNORMAL LOW (ref 0.8–2.0)

## 2021-10-12 MED ORDER — CARVEDILOL 3.125 MG PO TABS
3.1250 mg | ORAL_TABLET | Freq: Two times a day (BID) | ORAL | 11 refills | Status: DC
  Filled 2021-10-12 – 2021-12-23 (×2): qty 60, 30d supply, fill #0
  Filled 2022-02-01: qty 60, 30d supply, fill #1

## 2021-10-12 NOTE — Patient Instructions (Signed)
EKG done today.  Labs done today. We will contact you only if your labs are abnormal.  START Carvedilol 3.125mg  (1 tablet) by mouth 2 times daily.   No other medication changes were made. Please continue all current medications as prescribed.  Your physician recommends that you schedule a follow-up appointment in: 4 weeks with our Clinic Pharmacist and in 8-10 weeks with Dr. Haroldine Laws.   If you have any questions or concerns before your next appointment please send [place] a message through [place] or call our office at [phone].    TO LEAVE A MESSAGE FOR THE NURSE SELECT OPTION 2, PLEASE LEAVE A MESSAGE INCLUDING: YOUR NAME DATE OF BIRTH CALL BACK NUMBER REASON FOR CALL**this is important as we prioritize the call backs  YOU WILL RECEIVE A CALL BACK THE SAME DAY AS LONG AS YOU CALL BEFORE 4:00 PM   Do the following things EVERYDAY: Weigh yourself in the morning before breakfast. Write it down and keep it in a log. Take your medicines as prescribed Eat low salt foods--Limit salt (sodium) to 2000 mg per day.  Stay as active as you can everyday Limit all fluids for the day to less than 2 liters   At the [facility], you and your health needs are our priority. As part of our continuing mission to provide you with exceptional heart care, we have created designated Provider Care Teams. These Care Teams include your primary Cardiologist (physician) and Advanced Practice Providers (APPs- Physician Assistants and Nurse Practitioners) who all work together to provide you with the care you need, when you need it.   You may see any of the following providers on your designated Care Team at your next follow up: Dr Glori Bickers Dr Haynes Kerns, NP Lyda Jester, [place] Audry Riles, PharmD   Please be sure to bring in all your medications bottles to every appointment.

## 2021-10-12 NOTE — Progress Notes (Signed)
Advanced Heart Failure Clinic Note   Referring Physician: PCP: Patient, No Pcp Per (Inactive) PCP-Cardiologist: Dr. Haroldine Laws   Reason for Visit: f/u for biventricular heart failure   HPI:  Kurt Woodard is a 60 y/o male with a h/o HTN, CKD 3b (Scr 1.8), DVT and systolic CHF.    Found to have DVT in 2018. He then developed fluid overload and had echo at OSH in 2019 and was told heart was operating at 20%. Underwent diuresis and felt better.  Did not have cath or repeat u/s. Says he got scared and decided not to f/u.    Seen 8/20 in HF Clinic for one visit. Bedside echo showed EF 20% and RV moderately HK. Reported only NYHA II symptoms but was markedly tachycardic. Advised on need for R/L cath but he never followed up. DVT felt to be likely provoked in setting of immobility when he first had HF.  Admitted 5/22 w/ acute on chronic systolic heart failure and acute RLL segmental PE and large rt pleural effusion requiring thoracentesis. LE doppler negative for PE. Repeat echo showed EF 10-15% w/ RV dilated  and severely HK. R/LHC 5/27 with minimal CAD and severe NICM, mildly elevated filling pressures and relatively preserved CO, 5.4, CI 2.5. cMRI was recommended but unable to get completed inpatient. Recs were to obtain as outpatient. He was placed on GDMT and transition from heparin gtt to Eliquis for PE. D/c wt 211 lb.   Unfortunately, he was lost to f/u and not seen since 5/22. Returns now for f/u. Echo completed today and shows low, but slightly improved EF ~25% w/ global HK. RV mildly enlarged and mildly reduced. Moderate MR noted w/ restricted posterior leaflet motion. Mild TR.   BP elevated, 160/90 (checked x 2), reports good control at home ~379K systolic. Pulse rate 102, EKG shows sinus tach 101 bpm. Wt 215 lb. He reports felling well w/ minimal symptoms, NYHA Class I-II. Denies any LEE. No orthopnea/PND. Reports full med compliance.   Lives by himself. Stays on the go. Coaches youth  volleyball team. Uninsured.    2D Echo 04/2021 LVEF 10-15%, RV severely reduced, Mod MR, Mod-severe TR  Echo 10/12/21 (Today) LVEF 25%, RV mildly reduced, Mod MR w/ restricted posterior leaflet motion.    [facility] 04/2021   RPDA lesion is 50% stenosed. 1st Diag lesion is 20% stenosed. Dist LAD lesion is 20% stenosed.   Findings:   Ao = 108/71 (88) LV = 112/22 RA =  10 RV = 59/14 PA = 57/22 (37) PCW = 14 Fick cardiac output/index = 5.4/2.5 PVR = 4.3 WU SVR = 1166 Ao sat = 99% PA sat = 71%, 72%   Assessment: 1. Minimal CAD 2. Severe NICM  3. Mild PAH 4. Mildly elevated filling pressure with relatively preserved CO   Review of Systems: [y] = yes, [ ]  = no   General: Weight gain [ ] ; Weight loss [ ] ; Anorexia [ ] ; Fatigue [ ] ; Fever [ ] ; Chills [ ] ; Weakness [ ]   Cardiac: Chest pain/pressure [ ] ; Resting SOB [ ] ; Exertional SOB [ ] ; Orthopnea [ ] ; Pedal Edema [ ] ; Palpitations [ ] ; Syncope [ ] ; Presyncope [ ] ; Paroxysmal nocturnal dyspnea[ ]   Pulmonary: Cough [ ] ; Wheezing[ ] ; Hemoptysis[ ] ; Sputum [ ] ; Snoring [ ]   GI: Vomiting[ ] ; Dysphagia[ ] ; Melena[ ] ; Hematochezia [ ] ; Heartburn[ ] ; Abdominal pain [ ] ; Constipation [ ] ; Diarrhea [ ] ; BRBPR [ ]   GU: Hematuria[ ] ; Dysuria [ ] ;  Nocturia_0   Vascular: Pain in legs with walking _1 ; Pain in feet with lying flat _2 ; Non-healing sores _3 ; Stroke _4 ; TIA _5 ; Slurred speech _6 ;  Neuro: Headaches_7 ; Vertigo_8 ; Seizures_9 ; Paresthesias_10 ;Blurred vision _11 ; Diplopia _12 ; Vision changes _13   Ortho/Skin: Arthritis _14 ; Joint pain _15 ; Muscle pain _16 ; Joint swelling _17 ; Back Pain _18 ; Rash _19   Psych: Depression_20 ; Anxiety_21   Heme: Bleeding problems _22 ; Clotting disorders _23 ; Anemia _24   Endocrine: Diabetes [ Y]; Thyroid dysfunction_25    Past Medical History:  Diagnosis Date   CHF (congestive heart failure) (HCC)    Chronic congestive heart failure ([place]) 08/27/2019   Renal disorder    kidney stones    Current  Outpatient Medications  Medication Sig Dispense Refill   apixaban (ELIQUIS) 5 MG TABS tablet Take 1 tablet (5 mg total) by mouth 2 (two) times daily. 60 tablet 2   blood glucose meter kit and supplies KIT Dispense based on patient and insurance preference. Use up to four times daily as directed. 1 each 0   digoxin (LANOXIN) 0.125 MG tablet Take 1 tablet (0.125 mg total) by mouth daily. 30 tablet 2   furosemide (LASIX) 40 MG tablet Take 1 tablet (40 mg total) by mouth 2 (two) times daily. 60 tablet 2   glipiZIDE (GLUCOTROL) 10 MG tablet Take 1 tablet (10 mg total) by mouth 2 (two) times daily before a meal. 60 tablet 3   sacubitril-valsartan (ENTRESTO) 49-51 MG Take 1 tablet by mouth 2 (two) times daily. Must keep further appointments for refills 60 tablet 2   spironolactone (ALDACTONE) 25 MG tablet Take 1 tablet (25 mg total) by mouth daily. 30 tablet 2   No current facility-administered medications for this encounter.    No Known Allergies    Social History   Socioeconomic History   Marital status: Single    Spouse name: Not on file   Number of children: Not on file   Years of education: Not on file   Highest education level: Not on file  Occupational History   Not on file  Tobacco Use   Smoking status: Never   Smokeless tobacco: Never  Substance and Sexual Activity   Alcohol use: Not on file   Drug use: Not on file   Sexual activity: Not on file  Other Topics Concern   Not on file  Social History Narrative   Not on file   Social Determinants of Health   Financial Resource Strain: Medium Risk   Difficulty of Paying Living Expenses: Somewhat hard  Food Insecurity: Food Insecurity Present   Worried About Running Out of Food in the Last Year: Sometimes true   Ran Out of Food in the Last Year: Sometimes true  Transportation Needs: No Transportation Needs   Lack of Transportation (Medical): No   Lack of Transportation (Non-Medical): No  Physical Activity: Not on file   Stress: Not on file  Social Connections: Not on file  Intimate Partner Violence: Not on file     History reviewed. No pertinent family history.  Vitals:   10/12/21 1416  BP: (!) 160/90  Pulse: (!) 102  SpO2: 97%  Weight: 97.6 kg (215 lb 3.2 oz)     PHYSICAL EXAM: General:  Well appearing. No respiratory difficulty HEENT: normal Neck: supple. JVD 7 cm. Carotids 2+ bilat; no bruits. No lymphadenopathy or thyromegaly appreciated. Cor: PMI  nondisplaced. Regular rhythm, tachy rate No rubs, gallops or murmurs. Lungs: clear Abdomen: soft, nontender, nondistended. No hepatosplenomegaly. No bruits or masses. Good bowel sounds. Extremities: no cyanosis, clubbing, rash, edema Neuro: alert & oriented x 3, cranial nerves grossly intact. moves all 4 extremities w/o difficulty. Affect pleasant.  ECG: Sinus tach 101 bpm, personally reviewed    ASSESSMENT & PLAN:  1. Chronic Biventricular Heart Failure  - Suspect HTN CM  - Echo 5/22: EF 15% RV severely HK - cath 5/27 with minimal CAD. Severe NICM. Hemodynamics much better than expected. Fick cardiac output/index = 5.4/2.5 - has not had cMRI. Uninsured  - Echo today 10/12/21, EF ~25%, RV mildly reduced  - NYHA Class I-II. Euvolemic on exam. Tachycardic w/ elevated BP  - Add low dose Coreg, 3.125 mg bid  - Continue Entresto 49-51 bid - Continue Spiro 25 mg daily  - Continue digoxin 0.125 mg daily. Check Dig level  - No SGLT2i yet (Hgb A1c 11.6 in May). Repeat Hgb A1c today. If <10, plan to add next visit - BMP today    2. Hypertension  - Moderately elevated - GDMT per above - add Coreg 3.125 bid today    3. H/o RLL Subsegmental PE - Diagnosed 5/22, LE Dopplers negative for DVT  - Fully compliant w/ Eliquis    4. 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 Type 2 DM - poorly controlled, last Hgb A1c 11.6  - repeat Hgb A1c today, if < 10 plan to add SLGT2i at f/u  - c/w glipizide per PCP    F/u  w/ Pharm D in 4 weeks for further med titration, APP in 8 weeks   Kurt Jester, PA-C 10/12/21   Patient seen and examined with the above-signed Advanced Practice Provider and/or Housestaff. I personally reviewed laboratory data, imaging studies and relevant notes. I independently examined the patient and formulated the important aspects of the plan. I have edited the note to reflect any of my changes or salient points. I have personally discussed the plan with the patient and/or family.  We have not seen him in almost 6 months. Nevertheless he has been complaint with meds and doing quite well despite persistently reduced EF on echo today. NYHA I-II. Volume status looks good.   General:  Well appearing. No resp difficulty HEENT: normal Neck: supple. no JVD. Carotids 2+ bilat; no bruits. No lymphadenopathy or thryomegaly appreciated. Cor: PMI nondisplaced. Regular rate & rhythm. No rubs, gallops or murmurs. Lungs: clear Abdomen: obese soft, nontender, nondistended. No hepatosplenomegaly. No bruits or masses. Good bowel sounds. Extremities: no cyanosis, clubbing, rash, edema Neuro: alert & orientedx3, cranial nerves grossly intact. moves all 4 extremities w/o difficulty. Affect pleasant   Will add carvedilol today. Refer to PharmD med titration clinic for GDMT titration. IF EF not improving will need to consider ICD. Now that HgBA1c < 10 hopefully we can dd SGLT2i at next visit.   Glori Bickers, MD  11:41 PM

## 2021-10-12 NOTE — Progress Notes (Signed)
  Echocardiogram 2D Echocardiogram has been performed.  Kurt Woodard M 10/12/2021, 1:55 PM

## 2021-10-19 ENCOUNTER — Encounter: Payer: Self-pay | Admitting: Critical Care Medicine

## 2021-10-19 ENCOUNTER — Other Ambulatory Visit: Payer: Self-pay

## 2021-10-19 ENCOUNTER — Ambulatory Visit: Payer: Self-pay | Attending: Critical Care Medicine | Admitting: Critical Care Medicine

## 2021-10-19 VITALS — BP 162/95 | HR 101 | Resp 16 | Wt 211.0 lb

## 2021-10-19 DIAGNOSIS — Z9889 Other specified postprocedural states: Secondary | ICD-10-CM

## 2021-10-19 DIAGNOSIS — Z23 Encounter for immunization: Secondary | ICD-10-CM

## 2021-10-19 DIAGNOSIS — I10 Essential (primary) hypertension: Secondary | ICD-10-CM

## 2021-10-19 DIAGNOSIS — Z1159 Encounter for screening for other viral diseases: Secondary | ICD-10-CM

## 2021-10-19 DIAGNOSIS — E1165 Type 2 diabetes mellitus with hyperglycemia: Secondary | ICD-10-CM

## 2021-10-19 DIAGNOSIS — Z1211 Encounter for screening for malignant neoplasm of colon: Secondary | ICD-10-CM

## 2021-10-19 DIAGNOSIS — E11630 Type 2 diabetes mellitus with periodontal disease: Secondary | ICD-10-CM

## 2021-10-19 DIAGNOSIS — B351 Tinea unguium: Secondary | ICD-10-CM

## 2021-10-19 DIAGNOSIS — I5023 Acute on chronic systolic (congestive) heart failure: Secondary | ICD-10-CM

## 2021-10-19 DIAGNOSIS — I2699 Other pulmonary embolism without acute cor pulmonale: Secondary | ICD-10-CM

## 2021-10-19 DIAGNOSIS — E1169 Type 2 diabetes mellitus with other specified complication: Secondary | ICD-10-CM | POA: Insufficient documentation

## 2021-10-19 DIAGNOSIS — I5022 Chronic systolic (congestive) heart failure: Secondary | ICD-10-CM

## 2021-10-19 MED ORDER — ENTRESTO 49-51 MG PO TABS
1.0000 | ORAL_TABLET | Freq: Two times a day (BID) | ORAL | 3 refills | Status: DC
  Filled 2021-10-19 – 2021-11-18 (×2): qty 60, 30d supply, fill #0
  Filled 2022-02-01: qty 60, 30d supply, fill #1
  Filled 2022-02-02: qty 60, 30d supply, fill #0
  Filled 2022-04-25 – 2022-05-05 (×3): qty 60, 30d supply, fill #1

## 2021-10-19 MED ORDER — EMPAGLIFLOZIN 10 MG PO TABS
10.0000 mg | ORAL_TABLET | Freq: Every day | ORAL | 4 refills | Status: DC
  Filled 2021-10-19: qty 14, 14d supply, fill #0
  Filled 2021-11-04: qty 30, 30d supply, fill #1
  Filled 2021-12-14: qty 30, 30d supply, fill #2

## 2021-10-19 MED ORDER — DIGOXIN 125 MCG PO TABS
0.1250 mg | ORAL_TABLET | Freq: Every day | ORAL | 4 refills | Status: DC
  Filled 2021-10-19: qty 60, 60d supply, fill #0
  Filled 2021-11-04 – 2021-12-23 (×2): qty 30, 30d supply, fill #0

## 2021-10-19 MED ORDER — ATORVASTATIN CALCIUM 10 MG PO TABS
10.0000 mg | ORAL_TABLET | Freq: Every day | ORAL | 3 refills | Status: DC
  Filled 2021-10-19: qty 30, 30d supply, fill #0
  Filled 2021-11-18 – 2021-11-19 (×2): qty 30, 30d supply, fill #1
  Filled 2021-12-14 – 2022-02-01 (×2): qty 30, 30d supply, fill #2
  Filled 2022-02-02: qty 30, 30d supply, fill #0
  Filled 2022-04-25 – 2022-05-05 (×2): qty 30, 30d supply, fill #1
  Filled 2022-06-20: qty 90, 90d supply, fill #2
  Filled 2022-09-01: qty 30, 30d supply, fill #3

## 2021-10-19 MED ORDER — CICLOPIROX 8 % EX SOLN
Freq: Every day | CUTANEOUS | 0 refills | Status: DC
Start: 2021-10-19 — End: 2022-06-21
  Filled 2021-10-19: qty 6.6, 30d supply, fill #0

## 2021-10-19 MED ORDER — SPIRONOLACTONE 25 MG PO TABS
25.0000 mg | ORAL_TABLET | Freq: Every day | ORAL | 3 refills | Status: DC
  Filled 2021-10-19: qty 60, 60d supply, fill #0
  Filled 2021-11-04: qty 30, 30d supply, fill #0
  Filled 2021-12-14: qty 30, 30d supply, fill #1
  Filled 2021-12-23: qty 30, 30d supply, fill #0
  Filled 2022-02-01: qty 30, 30d supply, fill #1
  Filled 2022-04-25 – 2022-05-05 (×2): qty 30, 30d supply, fill #2
  Filled 2022-06-20: qty 30, 30d supply, fill #3
  Filled 2022-09-01: qty 30, 30d supply, fill #4

## 2021-10-19 MED ORDER — APIXABAN 5 MG PO TABS
5.0000 mg | ORAL_TABLET | Freq: Two times a day (BID) | ORAL | 2 refills | Status: DC
  Filled 2021-10-19 – 2021-11-18 (×2): qty 60, 30d supply, fill #0
  Filled 2022-02-01: qty 60, 30d supply, fill #1
  Filled 2022-02-02: qty 60, 30d supply, fill #0

## 2021-10-19 MED ORDER — FUROSEMIDE 40 MG PO TABS
40.0000 mg | ORAL_TABLET | Freq: Two times a day (BID) | ORAL | 2 refills | Status: DC
  Filled 2021-10-19: qty 90, 45d supply, fill #0
  Filled 2021-11-18: qty 60, 30d supply, fill #0
  Filled 2022-02-01: qty 60, 30d supply, fill #1
  Filled 2022-02-02: qty 60, 30d supply, fill #0
  Filled 2022-04-25 – 2022-05-05 (×2): qty 60, 30d supply, fill #1
  Filled 2022-06-20: qty 60, 30d supply, fill #2

## 2021-10-19 NOTE — Assessment & Plan Note (Signed)
Due to heart failure and pulmonary embolism no evidence of recurrence of pleural effusion on exam will observe

## 2021-10-19 NOTE — Assessment & Plan Note (Addendum)
Poorly controlled diabetes but now hemoglobin A1c down to 7.4 from previous values greater than 11  Plan to add Jardiance and provide patient assistance for this  Continue glipizide as prescribed  Plan to add atorvastatin 10 mg daily low-dose with underlying coronary disease and diabetes  Obtain urine for microalbumin  Bring testing meter in for a visit with clinical pharmacy in 2 weeks and further diabetes follow-up  Encouraged to keep appointment with clinical pharmacist in the heart failure clinic for his heart condition

## 2021-10-19 NOTE — Progress Notes (Signed)
New Patient Office Visit  Subjective:  Patient ID: Kurt Woodard, male    DOB: February 13, 1961  Age: 60 y.o. MRN: [id number]  CC:  Chief Complaint  Patient presents with   Diabetes   Hypertension    HPI Kurt Woodard presents for PCP to establish  Patient has a  history of type 2 diabetes diagnosed during a hospitalization in May of this year.  Also history of recurrent heart failure status post thoracentesis and full evaluation as documented below.  's patient was in the hospital in May of this year and was found to have pulmonary emboli along with acute on chronic heart failure.  The patient was prescribed Jardiance and glipizide however he was not able to afford the Jardiance and did not stay on it.  He is on the glipizide.  His hemoglobin A1c was greater than 11 in May and repeat last week in the cardiology office down to 7.4.  Patient does not check his blood sugars regularly because he does not like pricking himself.  Patient remains uninsured.  Blood pressures been difficult to manage he just was placed on beta-blocker at the last visit by cardiology.  He is picking this medicine up today.  On arrival blood pressure elevated 162/95.  Below is documentation from the PA visit in our clinic in July Saw Kurt Woodard 06/2021 Kurt Woodard is a 60 y.o. male here today to establish care and for a follow up visit After hospitalization 5/25-5/28/2022 for PE.  He is not taking jardiance bc it was too expensive.  He is not taking metformin bc it upset his stomach.  He is out of most of his meds.  He denies SOB or CP.  Weights stable.  Not checking blood sugars bc "I don't know how to work it."  His son is going to help him with this.  He does not have insurance.  He did not bring his meter with him.     From discharge summary: Indication for Hospitalization: Pulmonary Emboli   Discharge Diagnoses/Problem List:  Active Problems:   Pulmonary emboli (HCC)   Acute on chronic systolic congestive  heart failure (HCC)   Type 2 diabetes mellitus with hyperglycemia, without long-term current use of insulin (HCC)   S/P thoracentesis     Brief Hospital Course:  Kurt Woodard is a 60 y.o. male who presented with left-sided chest pain, found to have subsegmental pulmonary emboli. Reported PMH is only significant for CHF.   Subsegmental pulmonary emboli Patient was tachycardic to 110s, tachypneic to 20s and hypertensive to 415A systolic in the ED prior to admission.  Reassuringly, he was saturating well on room air.  ED work-up notable for negative DVT ultrasound.  CTA with evidence of left lower lobe emboli and left upper lobe emboli segmental and subsegmental level.  Troponins flat at 59 and 48.  EKG without ST segment abnormalities.  Patient was started on heparin gtt.  Echocardiogram demonstrated no LV thrombus but severely reduced EF at 10-15%, more info below.  Patient remained hemodynamically stable throughout admission and did not require any supplemental respiratory support.  Was able to be transitioned to apixaban on 5/27 after right heart cath performed.  Discharged to continue 6 days of 10 mg BID Abixaban and then transition to 5 mg BID.   Acute on Chronic CHF Exacerabation BNP 961.5. Examination notable for pitting edema, pulmonary edema on CXR. Cardiology consulted. Patient diuresed initially with IV Lasix 80 BID. Given slight creatinine bump the following day without much  output, further diuresis was held. Echo showed EF of 10-15% with global hypokinesis, moderate to severe mitral and tricuspid regurgitation and dilated IVC. Patient underwent R/L heart cath on 5/27 which showed minimal CAD, severe non-ischemic cardiomyopathy, mild PAH and mildly elevated filling pressure with relatively preserved CO. Was unfortunately unable to get cardiac MRI in the hospital, recommended for follow up outpatient. Continued on digoxin and entresto during hospitalization. Restarted on diuretics of Lasix and  Spironolactone prior to d/c.   Acute Kidney Injury on CKD Creatinine 1.83 on admission. Was briefly started on diuresis with IV Lasix 80 BID but patient without significant UOP and with slight bump in creatinine. Diuresis stopped after 1-day and creatinine improved. Creatinine 1.53 on d/c.   Chronic Right Pleural Effusion Patient had 1100 cc of serosanguinous fluid drained by thoracentesis on 5/26. Fluid LDH slightly increased at 56, Pleural fluid Glucose 228, Fluid Amylase 12, fluid protein <3. Cytology without WBC or organisms seen. Fluid culture without growth x1 day.  Transudative process. Pulmonology felt this was most likely caused by asymmetric chronic volume overload in the setting of mitral valve regurgitation.   Subcarinal Lymph Nodes CTA demonstrated subcarinal lymph nodes that could be reactive. Also with persistent airspace in the right lung base and nodal enlargement and repeat CT assessment in 55-month with pulmonology follow up for further assessment.   Poorly Controlled T2DM Hgb A1c 11.6. Not previously on home medications. CBG's monitored and patient was on Lantus 10U and moderate SSI.  Discharged with Jardiance 10 mg daily and Metformin 500 mg BID. Patient was not amenable to starting Lantus on discharge.   Follow-up recommendations Ensure that patient has age-appropriate cancer screening i.e. colonoscopy Needs repeat CT in 3 months and pulmology follow up for further assessment of subcarinal lymph nodes and persistent airspace in right long base Will need to follow up with cardiology for his severe HFrEF Consider doing outpatient MDelavan Lakestarting insulin, was strongly recommended to start given his uncontrolled diabetes. 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 Discharged with Jardiance 10 mg daily and Metformin 500 mg BID. Metformin limit should be 1500 mg with his GFR.   1. Type 2 diabetes mellitus with hyperglycemia, without  long-term current use of insulin ([place] Non-compliant-he says son will teach him about meter - Glucose (CBG) - Comprehensive metabolic panel - glipiZIDE (GLUCOTROL) 10 MG tablet; Take 1 tablet (10 mg total) by mouth 2 (two) times daily before a meal.  Dispense: 60 tablet; Refill: 3   2. Acute on chronic systolic congestive heart failure (HCC) Weight is stable - spironolactone (ALDACTONE) 25 MG tablet; Take 1 tablet (25 mg total) by mouth daily.  Dispense: 30 tablet; Refill: 2 - sacubitril-valsartan (ENTRESTO) 49-51 MG; Take 1 tablet by mouth 2 (two) times daily. Must keep further appointments for refills  Dispense: 60 tablet; Refill: 2 - furosemide (LASIX) 40 MG tablet; Take 1 tablet (40 mg total) by mouth 2 (two) times daily.  Dispense: 60 tablet; Refill: 2 - digoxin (LANOXIN) 0.125 MG tablet; Take 1 tablet (0.125 mg total) by mouth daily.  Dispense: 30 tablet; Refill: 2   3. Other pulmonary embolism without acute cor pulmonale, unspecified chronicity ([place] - Ambulatory referral to Pulmonology - apixaban (ELIQUIS) 5 MG TABS tablet; Take 1 tablet (5 mg total) by mouth 2 (two) times daily.  Dispense: 60 tablet; Refill: 2   4. S/P thoracentesis   - Ambulatory referral to Pulmonology   5. Hospital discharge follow-up  6. Hyponatremia - Comprehensive metabolic panel   7. Noncompliance Compliance imperative-discussed at length.    Below is documentation from the heart failure clinic with Dr. Haroldine Laws in October  Kurt Woodard is a 60 y/o male with a h/o HTN, CKD 3b (Scr 1.8), DVT and systolic CHF.    Found to have DVT in 2018. He then developed fluid overload and had echo at OSH in 2019 and was told heart was operating at 20%. Underwent diuresis and felt better.  Did not have cath or repeat u/s. Says he got scared and decided not to f/u.    Seen 8/20 in HF Clinic for one visit. Bedside echo showed EF 20% and RV moderately HK. Reported only NYHA II symptoms but was markedly tachycardic.  Advised on need for R/L cath but he never followed up. DVT felt to be likely provoked in setting of immobility when he first had HF.   Admitted 5/22 w/ acute on chronic systolic heart failure and acute RLL segmental PE and large rt pleural effusion requiring thoracentesis. LE doppler negative for PE. Repeat echo showed EF 10-15% w/ RV dilated  and severely HK. R/LHC 5/27 with minimal CAD and severe NICM, mildly elevated filling pressures and relatively preserved CO, 5.4, CI 2.5. cMRI was recommended but unable to get completed inpatient. Recs were to obtain as outpatient. He was placed on GDMT and transition from heparin gtt to Eliquis for PE. D/c wt 211 lb.    Unfortunately, he was lost to f/u and not seen since 5/22. Returns now for f/u. Echo completed today and shows low, but slightly improved EF ~25% w/ global HK. RV mildly enlarged and mildly reduced. Moderate MR noted w/ restricted posterior leaflet motion. Mild TR.    BP elevated, 160/90 (checked x 2), reports good control at home ~893T systolic. Pulse rate 102, EKG shows sinus tach 101 bpm. Wt 215 lb. He reports felling well w/ minimal symptoms, NYHA Class I-II. Denies any LEE. No orthopnea/PND. Reports full med compliance.    Lives by himself. Stays on the go. Coaches youth volleyball team. Uninsured.         1. Chronic Biventricular Heart Failure  - Suspect HTN CM  - Echo 5/22: EF 15% RV severely HK - cath 5/27 with minimal CAD. Severe NICM. Hemodynamics much better than expected. Fick cardiac output/index = 5.4/2.5 - has not had cMRI. Uninsured  - Echo today 10/12/21, EF ~25%, RV mildly reduced  - NYHA Class I-II. Euvolemic on exam. Tachycardic w/ elevated BP  - Add low dose Coreg, 3.125 mg bid  - Continue Entresto 49-51 bid - Continue Spiro 25 mg daily  - Continue digoxin 0.125 mg daily. Check Dig level  - No SGLT2i yet (Hgb A1c 11.6 in May). Repeat Hgb A1c today. If <10, plan to add next visit - BMP today    2. Hypertension  -  Moderately elevated - GDMT per above - add Coreg 3.125 bid today    3. H/o RLL Subsegmental PE - Diagnosed 5/22, LE Dopplers negative for DVT  - Fully compliant w/ Eliquis    4. 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 Type 2 DM - poorly controlled, last Hgb A1c 11.6  - repeat Hgb A1c today, if < 10 plan to add SLGT2i at f/u  - c/w glipizide per PCP    Patient states he is trying to improve his diet.  Note his Dopplers were  negative for deep venous thrombosis but pulmonary emboli right lower lobe present.  He remains compliant with Eliquis.  He could not get the [facility] Llorons Torres because of finances.  He does maintain the glipizide.  He has no complaints today.  He agrees to receive both the pneumonia and flu vaccine at this visit.  He needs a foot exam and an eye exam.  He needs microalbumin testing at this visit.  Also colon cancer screening is needed  Note the patient did have a tetanus vaccine 6 years ago  Past Medical History:  Diagnosis Date   CHF (congestive heart failure) ([place])    Chronic congestive heart failure ([place]) 08/27/2019   Renal disorder    kidney stones    Past Surgical History:  Procedure Laterality Date   RIGHT/LEFT HEART CATH AND CORONARY ANGIOGRAPHY N/A 05/08/2021   Procedure: RIGHT/LEFT HEART CATH AND CORONARY ANGIOGRAPHY;  Surgeon: Jolaine Artist, MD;  Location: [facility];  Service: Cardiovascular;  Laterality: N/A;    History reviewed. No pertinent family history.  Social History   Socioeconomic History   Marital status: Single    Spouse name: Not on file   Number of children: Not on file   Years of education: Not on file   Highest education level: Not on file  Occupational History   Not on file  Tobacco Use   Smoking status: Never   Smokeless tobacco: Never  Substance and Sexual Activity   Alcohol use: Not on file   Drug use: Not on file   Sexual activity: Not on file  Other Topics Concern   Not  on file  Social History Narrative   Not on file   Social Determinants of Health   Financial Resource Strain: Medium Risk   Difficulty of Paying Living Expenses: Somewhat hard  Food Insecurity: Food Insecurity Present   Worried About Running Out of Food in the Last Year: Sometimes true   Ran Out of Food in the Last Year: Sometimes true  Transportation Needs: No Transportation Needs   Lack of Transportation (Medical): No   Lack of Transportation (Non-Medical): No  Physical Activity: Not on file  Stress: Not on file  Social Connections: Not on file  Intimate Partner Violence: Not on file    ROS Review of Systems  Constitutional:  Negative for chills, diaphoresis and fever.  HENT:  Negative for congestion, hearing loss, nosebleeds, sore throat and tinnitus.   Eyes:  Negative for photophobia and redness.  Respiratory:  Negative for cough, shortness of breath, wheezing and stridor.   Cardiovascular:  Negative for chest pain, palpitations and leg swelling.  Gastrointestinal:  Negative for abdominal pain, blood in stool, constipation, diarrhea, nausea and vomiting.  Endocrine: Negative for polydipsia.  Genitourinary:  Negative for dysuria, flank pain, frequency, hematuria and urgency.  Musculoskeletal:  Negative for back pain, myalgias and neck pain.  Skin:  Negative for rash.  Allergic/Immunologic: Negative for environmental allergies.  Neurological:  Negative for dizziness, tremors, seizures, weakness and headaches.  Hematological:  Does not bruise/bleed easily.  Psychiatric/Behavioral: Negative.  Negative for agitation, self-injury, sleep disturbance and suicidal ideas. The patient is not nervous/anxious.    Objective:   Today's Vitals: BP (!) 162/95   Pulse (!) 101   Resp 16   Wt 211 lb (95.7 kg)   BMI 30.28 kg/m   Physical Exam Vitals reviewed.  Constitutional:      Appearance: Normal appearance. He is well-developed. He is not diaphoretic.  HENT:  Head:  Normocephalic and atraumatic.     Nose: No nasal deformity, septal deviation, mucosal edema or rhinorrhea.     Right Sinus: No maxillary sinus tenderness or frontal sinus tenderness.     Left Sinus: No maxillary sinus tenderness or frontal sinus tenderness.     Mouth/Throat:     Mouth: Mucous membranes are moist.     Pharynx: Oropharynx is clear. No oropharyngeal exudate.     Comments: Mild periodontal disease Eyes:     General: No scleral icterus.    Conjunctiva/sclera: Conjunctivae normal.     Pupils: Pupils are equal, round, and reactive to light.  Neck:     Thyroid: No thyromegaly.     Vascular: No carotid bruit or JVD.     Trachea: Trachea normal. No tracheal tenderness or tracheal deviation.  Cardiovascular:     Rate and Rhythm: Normal rate and regular rhythm.     Chest Wall: PMI is not displaced.     Pulses: Normal pulses. No decreased pulses.     Heart sounds: Normal heart sounds, S1 normal and S2 normal. Heart sounds not distant. No murmur heard. No systolic murmur is present.  No diastolic murmur is present.    No friction rub. No gallop. No S3 or S4 sounds.  Pulmonary:     Effort: No tachypnea, accessory muscle usage or respiratory distress.     Breath sounds: No stridor. No decreased breath sounds, wheezing, rhonchi or rales.  Chest:     Chest wall: No tenderness.  Abdominal:     General: Bowel sounds are normal. There is no distension.     Palpations: Abdomen is soft. Abdomen is not rigid.     Tenderness: There is no abdominal tenderness. There is no guarding or rebound.  Musculoskeletal:        General: No swelling, tenderness, deformity or signs of injury. Normal range of motion.     Cervical back: Normal range of motion and neck supple. No edema, erythema or rigidity. No muscular tenderness. Normal range of motion.     Right lower leg: No edema.     Left lower leg: No edema.     Comments: Bilateral toenail fungus  Lymphadenopathy:     Head:     Right side of  head: No submental or submandibular adenopathy.     Left side of head: No submental or submandibular adenopathy.     Cervical: No cervical adenopathy.  Skin:    General: Skin is warm and dry.     Coloration: Skin is not pale.     Findings: No rash.     Nails: There is no clubbing.  Neurological:     General: No focal deficit present.     Mental Status: He is alert and oriented to person, place, and time. Mental status is at baseline.     Sensory: No sensory deficit.  Psychiatric:        Mood and Affect: Mood normal.        Speech: Speech normal.        Behavior: Behavior normal.        Thought Content: Thought content normal.        Judgment: Judgment normal.    2D Echo 04/2021 LVEF 10-15%, RV severely reduced, Mod MR, Mod-severe TR   Echo 10/12/21 (Today) LVEF 25%, RV mildly reduced, Mod MR w/ restricted posterior leaflet motion.     [facility] 04/2021   RPDA lesion is 50% stenosed. 1st Diag lesion is 20%  stenosed. Dist LAD lesion is 20% stenosed.   Findings:   Ao = 108/71 (88) LV = 112/22 RA =  10 RV = 59/14 PA = 57/22 (37) PCW = 14 Fick cardiac output/index = 5.4/2.5 PVR = 4.3 WU SVR = 1166 Ao sat = 99% PA sat = 71%, 72%   Assessment: 1. Minimal CAD 2. Severe NICM  3. Mild PAH 4. Mildly elevated filling pressure with relatively preserved CO Assessment & Plan:   Problem List Items Addressed This Visit       Cardiovascular and Mediastinum   Chronic congestive heart failure (HCC)    Chronic congestive heart failure secondary to hypertension with mild coronary artery disease  Patient to begin Coreg at this visit he will pick up his medicines today and continue other medications from cardiology including Entresto, Aldactone, digoxin, furosemide      Relevant Medications   apixaban (ELIQUIS) 5 MG TABS tablet   digoxin (LANOXIN) 0.125 MG tablet   furosemide (LASIX) 40 MG tablet   sacubitril-valsartan (ENTRESTO) 49-51 MG   spironolactone (ALDACTONE) 25 MG  tablet   atorvastatin (LIPITOR) 10 MG tablet   Pulmonary emboli (HCC)    Pulmonary emboli right lower lobe with negative recurrence of DVT continue Eliquis for now      Relevant Medications   apixaban (ELIQUIS) 5 MG TABS tablet   digoxin (LANOXIN) 0.125 MG tablet   furosemide (LASIX) 40 MG tablet   sacubitril-valsartan (ENTRESTO) 49-51 MG   spironolactone (ALDACTONE) 25 MG tablet   atorvastatin (LIPITOR) 10 MG tablet   Essential hypertension    Poorly controlled hypertension hope will be improved with Coreg and the addition of Jardiance      Relevant Medications   apixaban (ELIQUIS) 5 MG TABS tablet   digoxin (LANOXIN) 0.125 MG tablet   furosemide (LASIX) 40 MG tablet   sacubitril-valsartan (ENTRESTO) 49-51 MG   spironolactone (ALDACTONE) 25 MG tablet   atorvastatin (LIPITOR) 10 MG tablet   RESOLVED: Acute on chronic systolic congestive heart failure (HCC)   Relevant Medications   apixaban (ELIQUIS) 5 MG TABS tablet   digoxin (LANOXIN) 0.125 MG tablet   furosemide (LASIX) 40 MG tablet   sacubitril-valsartan (ENTRESTO) 49-51 MG   spironolactone (ALDACTONE) 25 MG tablet   atorvastatin (LIPITOR) 10 MG tablet     Digestive   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 ([place])    Patient lives in [place] he needs a Copywriter, advertising ask him to see if the community college has a free dental care program with Copywriter, advertising and training otherwise he is to continue dental care daily with local toothbrushing      Relevant Medications   empagliflozin (JARDIANCE) 10 MG TABS tablet   atorvastatin (LIPITOR) 10 MG tablet     Endocrine   Type 2 diabetes mellitus with hyperglycemia, without long-term current use of insulin ([place]) - Primary    Poorly controlled diabetes but now hemoglobin A1c down to 7.4 from previous values greater than 11  Plan to add Jardiance and provide patient assistance for this  Continue glipizide as prescribed  Plan to add atorvastatin 10 mg  daily low-dose with underlying coronary disease and diabetes  Obtain urine for microalbumin  Bring testing meter in for a visit with clinical pharmacy in 2 weeks and further diabetes follow-up  Encouraged to keep appointment with clinical pharmacist in the heart failure clinic for his heart condition      Relevant Medications   empagliflozin (JARDIANCE) 10 MG TABS  tablet   atorvastatin (LIPITOR) 10 MG tablet   Other Relevant Orders   Microalbumin / creatinine urine ratio   Onychomycosis of multiple toenails with type 2 diabetes mellitus (HCC)    Plan topical Penlac therapy for now      Relevant Medications   empagliflozin (JARDIANCE) 10 MG TABS tablet   ciclopirox (PENLAC) 8 % solution   atorvastatin (LIPITOR) 10 MG tablet     Other   S/P thoracentesis    Due to heart failure and pulmonary embolism no evidence of recurrence of pleural effusion on exam will observe      Other Visit Diagnoses     Need for hepatitis C screening test       Relevant Orders   HCV Ab w Reflex to Quant PCR   Colon cancer screening       Relevant Orders   Fecal occult blood, imunochemical   Need for immunization against influenza       Relevant Orders   Flu Vaccine QUAD 108moIM (Fluarix, Fluzone & Alfiuria Quad PF) (Completed)       Outpatient Encounter Medications as of 10/19/2021  Medication Sig   atorvastatin (LIPITOR) 10 MG tablet Take 1 tablet (10 mg total) by mouth daily.   blood glucose meter kit and supplies KIT Dispense based on patient and insurance preference. Use up to four times daily as directed.   ciclopirox (PENLAC) 8 % solution Apply topically at bedtime. Apply over nail and surrounding skin. Apply daily over previous coat. After seven (7) days, may remove with alcohol and continue cycle.   empagliflozin (JARDIANCE) 10 MG TABS tablet Take 1 tablet (10 mg total) by mouth daily before breakfast.   glipiZIDE (GLUCOTROL) 10 MG tablet Take 1 tablet (10 mg total) by mouth 2 (two)  times daily before a meal.   [DISCONTINUED] apixaban (ELIQUIS) 5 MG TABS tablet Take 1 tablet (5 mg total) by mouth 2 (two) times daily.   [DISCONTINUED] digoxin (LANOXIN) 0.125 MG tablet Take 1 tablet (0.125 mg total) by mouth daily.   [DISCONTINUED] furosemide (LASIX) 40 MG tablet Take 1 tablet (40 mg total) by mouth 2 (two) times daily.   [DISCONTINUED] sacubitril-valsartan (ENTRESTO) 49-51 MG Take 1 tablet by mouth 2 (two) times daily. Must keep further appointments for refills   [DISCONTINUED] spironolactone (ALDACTONE) 25 MG tablet Take 1 tablet (25 mg total) by mouth daily.   apixaban (ELIQUIS) 5 MG TABS tablet Take 1 tablet (5 mg total) by mouth 2 (two) times daily.   carvedilol (COREG) 3.125 MG tablet Take 1 tablet (3.125 mg total) by mouth 2 (two) times daily. (Patient not taking: Reported on 10/19/2021)   digoxin (LANOXIN) 0.125 MG tablet Take 1 tablet (0.125 mg total) by mouth daily.   furosemide (LASIX) 40 MG tablet Take 1 tablet (40 mg total) by mouth 2 (two) times daily.   sacubitril-valsartan (ENTRESTO) 49-51 MG Take 1 tablet by mouth 2 (two) times daily.   spironolactone (ALDACTONE) 25 MG tablet Take 1 tablet (25 mg total) by mouth daily.   No facility-administered encounter medications on file as of 10/19/2021.   Patient agreed to PVan Wert20 pneumonia vaccine and flu vaccine at this visit  Also received colon cancer screening kit will [facility] Patient will come back for shingles vaccine in 1 month and will also obtain an eye exam [facility]  35 minutes spent  reviewing records documentation patient education performing exam and history medication reconciliation and coordinating of care   Follow-up: Return in about 2 months (around 12/19/2021).   Asencion Noble, MD

## 2021-10-19 NOTE — Assessment & Plan Note (Signed)
Plan topical Penlac therapy for now

## 2021-10-19 NOTE — Assessment & Plan Note (Signed)
Pulmonary emboli right lower lobe with negative recurrence of DVT continue Eliquis for now

## 2021-10-19 NOTE — Assessment & Plan Note (Signed)
Poorly controlled hypertension hope will be improved with Coreg and the addition of Jardiance

## 2021-10-19 NOTE — Assessment & Plan Note (Signed)
Patient lives in [place] he needs a Copywriter, advertising ask him to see if the community college has a free dental care program with Copywriter, advertising and training otherwise he is to continue dental care daily with local toothbrushing

## 2021-10-19 NOTE — Patient Instructions (Addendum)
Begin Jardiance 1 daily, you will receive patient assistance for this  Pick up your Coreg today to take twice daily per Dr. Haroldine Laws  Refills on all your medications sent to our pharmacy  Flu vaccine and pneumonia vaccine was given  Return in about a month to begin your shingles vaccine sooner  Begin Penlac and apply it once daily at bedtime on your toenails for the toenail fungus  An appointment with Lurena Joiner our clinical pharmacist to be made for your diabetes bring your testing kit and all your medicines in for that visit  We will schedule you for your first of your shingles vaccine this needs to be scheduled in about 4 weeks  Colon cancer screening kit will be issued you can go to the [facility] drop station in [place] area if you like or come back to this clinic and drop the result off  Continue to follow a healthy diet as outlined in attached and per our discussion  Blood work today is for hepatitis C screening  Return to see Dr. Joya Gaskins 2 months

## 2021-10-19 NOTE — Assessment & Plan Note (Signed)
Chronic congestive heart failure secondary to hypertension with mild coronary artery disease  Patient to begin Coreg at this visit he will pick up his medicines today and continue other medications from cardiology including Entresto, Aldactone, digoxin, furosemide

## 2021-10-20 ENCOUNTER — Telehealth: Payer: Self-pay

## 2021-10-20 LAB — HCV INTERPRETATION

## 2021-10-20 LAB — HCV AB W REFLEX TO QUANT PCR: HCV Ab: <0.1 s/co ratio (ref 0.0–0.9)

## 2021-10-20 NOTE — Telephone Encounter (Signed)
Pt was called and was unable to leave a vm. Information sent to the nurse pool.

## 2021-10-20 NOTE — Telephone Encounter (Signed)
-----   Message from Elsie Stain, MD sent at 10/20/2021  7:59 AM EST ----- Let pt know HCV is negative,  no hepatitis C

## 2021-10-23 ENCOUNTER — Other Ambulatory Visit: Payer: Self-pay

## 2021-11-04 ENCOUNTER — Other Ambulatory Visit: Payer: Self-pay

## 2021-11-09 ENCOUNTER — Other Ambulatory Visit (HOSPITAL_COMMUNITY): Payer: Self-pay

## 2021-11-09 NOTE — Progress Notes (Incomplete)
***In Progress*** PCP: Dr. Joya Gaskins PCP-Cardiologist: Dr. Haroldine Laws     HPI:  [name] is a 60 y/o male with a h/o HTN, CKD 3b (Scr 1.8), DVT and systolic CHF.    Found to have DVT in 2018. He then developed fluid overload and had echo at OSH in 2019 and was told heart was operating at 20%. Underwent diuresis and felt better.  Did not have cath or repeat u/s. Says he got scared and decided not to f/u.    Seen 07/2019 in HF Clinic for one visit. Bedside echo showed EF 20% and RV moderately HK. Reported only NYHA II symptoms but was markedly tachycardic. Advised on need for R/L cath but he never followed up. DVT felt to be likely provoked in setting of immobility when he first had HF.   Admitted 04/2021 w/ acute on chronic systolic heart failure and acute RLL segmental PE and large rt pleural effusion requiring thoracentesis. LE doppler negative for PE. Repeat echo showed EF 10-15% w/ RV dilated  and severely HK. R/LHC 05/08/21 with minimal CAD and severe NICM, mildly elevated filling pressures and relatively preserved CO, 5.4, CI 2.5. cMRI was recommended but unable to get completed inpatient. Recs were to obtain as outpatient. He was placed on GDMT and transitioned from heparin gtt to Eliquis for PE. D/c wt 211 lb.    Unfortunately, he was lost to f/u and not seen since 04/2021. Returned 10/12/21 for follow up with Dr. Haroldine Laws. Echo completed in clinic and shows low, but slightly improved EF ~25% w/ global HK. RV mildly enlarged and mildly reduced. Moderate MR noted w/ restricted posterior leaflet motion. Mild TR. BP was elevated in clinic, 160/90 (checked x 2), reported good control at home ~361W systolic. Pulse rate was 102, EKG showed sinus tach 101 bpm. Weight 215 lbs. He reported felling well w/ minimal symptoms, NYHA Class I-II. Denied any LEE. No orthopnea/PND. Reported full med compliance. He lives by himself. Stays on the go. Coaches youth volleyball team. Uninsured.   Today he returns to HF  clinic for pharmacist medication titration. At last visit with MD carvedilol 3.125 mg BID was initiated. He then established care with Dr. Joya Gaskins (PCP) who started Jardiance 10 mg daily on 10/19/21.   Shortness of breath/dyspnea on exertion? {YES NO:22349}  Orthopnea/PND? {YES NO:22349} Edema? {YES NO:22349} Lightheadedness/dizziness? {YES NO:22349} Daily weights at home? {YES NO:22349} Blood pressure/heart rate monitoring at home? {YES [id number] Following low-sodium/fluid-restricted diet? {YES NO:22349}  HF Medications: Carvedilol 3.125 mg BID Entresto 49/51 mg BID Spironolactone 25 mg daily Jardiance 10 mg daily Digoxin 0.125 mg daily Furosemide 40 mg BID   Has the patient been experiencing any side effects to the medications prescribed?  {YES NO:22349}  Does the patient have any problems obtaining medications due to transportation or finances?   {YES NO:22349}  Understanding of regimen: {excellent/good/fair/poor:19665} Understanding of indications: {excellent/good/fair/poor:19665} Potential of compliance: {excellent/good/fair/poor:19665} Patient understands to avoid NSAIDs. Patient understands to avoid decongestants.    Pertinent Lab Values: Serum creatinine ***, BUN ***, Potassium ***, Sodium ***, BNP ***, Magnesium ***, Digoxin ***   Vital Signs: Weight: *** (last clinic weight: ***) Blood pressure: ***  Heart rate: ***   Assessment/Plan: 1. Chronic Biventricular Heart Failure  - Suspect HTN CM  - Echo 5/22: EF 15% RV severely HK - cath 5/27 with minimal CAD. Severe NICM. Hemodynamics much better than expected. Fick cardiac output/index = 5.4/2.5 - has not had cMRI. Uninsured  - Echo today 10/12/21, EF ~25%, RV  mildly reduced  - NYHA Class I-II. Euvolemic on exam. Tachycardic w/ elevated BP  - Continue carvedilol 3.125 mg BID - Continue Entresto 49-51 mg BID - Continue Spironolactone 25 mg daily  - Continue digoxin 0.125 mg daily.  - Continue Jardiance 10 mg  daily   2. Hypertension  - Moderately elevated - Plan as above   3. H/o RLL Subsegmental PE - Diagnosed 5/22, LE Dopplers negative for DVT  - Fully compliant w/ Eliquis ***   4. 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 Type 2 DM - last Hgb A1c 7.4% - Continue Jardiance 10 mg daily - c/w glipizide per PCP     Follow up ***   Audry Riles, PharmD, BCPS, BCCP, CPP Heart Failure Clinic Pharmacist [phone]

## 2021-11-10 ENCOUNTER — Other Ambulatory Visit: Payer: Self-pay

## 2021-11-16 ENCOUNTER — Inpatient Hospital Stay (HOSPITAL_COMMUNITY): Admission: RE | Admit: 2021-11-16 | Payer: Self-pay | Source: Ambulatory Visit

## 2021-11-16 ENCOUNTER — Other Ambulatory Visit (HOSPITAL_COMMUNITY): Payer: Self-pay

## 2021-11-19 ENCOUNTER — Other Ambulatory Visit: Payer: Self-pay

## 2021-12-08 ENCOUNTER — Ambulatory Visit: Payer: Self-pay

## 2021-12-15 ENCOUNTER — Other Ambulatory Visit: Payer: Self-pay

## 2021-12-20 NOTE — Progress Notes (Incomplete)
Established Patient Office Visit  Subjective:  Patient ID: Kurt Woodard, male    DOB: December 13, 1961  Age: 61 y.o. MRN: [id number]  CC: No chief complaint on file.   HPI Kurt Woodard presents for *** Eye, fit   10/19/21 Patient has a  history of type 2 diabetes diagnosed during a hospitalization in May of this year.  Also history of recurrent heart failure status post thoracentesis and full evaluation as documented below.   's patient was in the hospital in May of this year and was found to have pulmonary emboli along with acute on chronic heart failure.   The patient was prescribed Jardiance and glipizide however he was not able to afford the Jardiance and did not stay on it.  He is on the glipizide.  His hemoglobin A1c was greater than 11 in May and repeat last week in the cardiology office down to 7.4.   Patient does not check his blood sugars regularly because he does not like pricking himself.   Patient remains uninsured.   Blood pressures been difficult to manage he just was placed on beta-blocker at the last visit by cardiology.  He is picking this medicine up today.  On arrival blood pressure elevated 162/95.   Below is documentation from the PA visit in our clinic in July Saw Thereasa Woodard 06/2021 Couper Kurt Woodard is a 61 y.o. male here today to establish care and for a follow up visit After hospitalization 5/25-5/28/2022 for PE.  He is not taking jardiance bc it was too expensive.  He is not taking metformin bc it upset his stomach.  He is out of most of his meds.  He denies SOB or CP.  Weights stable.  Not checking blood sugars bc "I don't know how to work it."  His son is going to help him with this.  He does not have insurance.  He did not bring his meter with him.     From discharge summary: Indication for Hospitalization: Pulmonary Emboli   Discharge Diagnoses/Problem List:  Active Problems:   Pulmonary emboli (HCC)   Acute on chronic systolic congestive heart failure (HCC)    Type 2 diabetes mellitus with hyperglycemia, without long-term current use of insulin (HCC)   S/P thoracentesis     Brief Hospital Course:  Kurt Woodard is a 61 y.o. male who presented with left-sided chest pain, found to have subsegmental pulmonary emboli. Reported PMH is only significant for CHF.   Subsegmental pulmonary emboli Patient was tachycardic to 110s, tachypneic to 20s and hypertensive to 735H systolic in the ED prior to admission.  Reassuringly, he was saturating well on room air.  ED work-up notable for negative DVT ultrasound.  CTA with evidence of left lower lobe emboli and left upper lobe emboli segmental and subsegmental level.  Troponins flat at 59 and 48.  EKG without ST segment abnormalities.  Patient was started on heparin gtt.  Echocardiogram demonstrated no LV thrombus but severely reduced EF at 10-15%, more info below.  Patient remained hemodynamically stable throughout admission and did not require any supplemental respiratory support.  Was able to be transitioned to apixaban on 5/27 after right heart cath performed.  Discharged to continue 6 days of 10 mg BID Abixaban and then transition to 5 mg BID.   Acute on Chronic CHF Exacerabation BNP 961.5. Examination notable for pitting edema, pulmonary edema on CXR. Cardiology consulted. Patient diuresed initially with IV Lasix 80 BID. Given slight creatinine bump the following day without much  output, further diuresis was held. Echo showed EF of 10-15% with global hypokinesis, moderate to severe mitral and tricuspid regurgitation and dilated IVC. Patient underwent R/L heart cath on 5/27 which showed minimal CAD, severe non-ischemic cardiomyopathy, mild PAH and mildly elevated filling pressure with relatively preserved CO. Was unfortunately unable to get cardiac MRI in the hospital, recommended for follow up outpatient. Continued on digoxin and entresto during hospitalization. Restarted on diuretics of Lasix and Spironolactone prior to  d/c.   Acute Kidney Injury on CKD Creatinine 1.83 on admission. Was briefly started on diuresis with IV Lasix 80 BID but patient without significant UOP and with slight bump in creatinine. Diuresis stopped after 1-day and creatinine improved. Creatinine 1.53 on d/c.   Chronic Right Pleural Effusion Patient had 1100 cc of serosanguinous fluid drained by thoracentesis on 5/26. Fluid LDH slightly increased at 56, Pleural fluid Glucose 228, Fluid Amylase 12, fluid protein <3. Cytology without WBC or organisms seen. Fluid culture without growth x1 day.  Transudative process. Pulmonology felt this was most likely caused by asymmetric chronic volume overload in the setting of mitral valve regurgitation.   Subcarinal Lymph Nodes CTA demonstrated subcarinal lymph nodes that could be reactive. Also with persistent airspace in the right lung base and nodal enlargement and repeat CT assessment in 56-month with pulmonology follow up for further assessment.   Poorly Controlled T2DM Hgb A1c 11.6. Not previously on home medications. CBG's monitored and patient was on Lantus 10U and moderate SSI.  Discharged with Jardiance 10 mg daily and Metformin 500 mg BID. Patient was not amenable to starting Lantus on discharge.   Follow-up recommendations Ensure that patient has age-appropriate cancer screening i.e. colonoscopy Needs repeat CT in 3 months and pulmology follow up for further assessment of subcarinal lymph nodes and persistent airspace in right long base Will need to follow up with cardiology for his severe HFrEF Consider doing outpatient MWelchstarting insulin, was strongly recommended to start given his uncontrolled diabetes. 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 Discharged with Jardiance 10 mg daily and Metformin 500 mg BID. Metformin limit should be 1500 mg with his GFR.   1. Type 2 diabetes mellitus with hyperglycemia, without long-term current use of insulin  ([place] Non-compliant-he says son will teach him about meter - Glucose (CBG) - Comprehensive metabolic panel - glipiZIDE (GLUCOTROL) 10 MG tablet; Take 1 tablet (10 mg total) by mouth 2 (two) times daily before a meal.  Dispense: 60 tablet; Refill: 3   2. Acute on chronic systolic congestive heart failure (HCC) Weight is stable - spironolactone (ALDACTONE) 25 MG tablet; Take 1 tablet (25 mg total) by mouth daily.  Dispense: 30 tablet; Refill: 2 - sacubitril-valsartan (ENTRESTO) 49-51 MG; Take 1 tablet by mouth 2 (two) times daily. Must keep further appointments for refills  Dispense: 60 tablet; Refill: 2 - furosemide (LASIX) 40 MG tablet; Take 1 tablet (40 mg total) by mouth 2 (two) times daily.  Dispense: 60 tablet; Refill: 2 - digoxin (LANOXIN) 0.125 MG tablet; Take 1 tablet (0.125 mg total) by mouth daily.  Dispense: 30 tablet; Refill: 2   3. Other pulmonary embolism without acute cor pulmonale, unspecified chronicity ([place] - Ambulatory referral to Pulmonology - apixaban (ELIQUIS) 5 MG TABS tablet; Take 1 tablet (5 mg total) by mouth 2 (two) times daily.  Dispense: 60 tablet; Refill: 2   4. S/P thoracentesis   - Ambulatory referral to Pulmonology   5. Hospital discharge follow-up  6. Hyponatremia - Comprehensive metabolic panel   7. Noncompliance Compliance imperative-discussed at length.     Below is documentation from the heart failure clinic with Dr. Haroldine Laws in October   Mr. Dorko is a 61 y/o male with a h/o HTN, CKD 3b (Scr 1.8), DVT and systolic CHF.    Found to have DVT in 2018. He then developed fluid overload and had echo at OSH in 2019 and was told heart was operating at 20%. Underwent diuresis and felt better.  Did not have cath or repeat u/s. Says he got scared and decided not to f/u.    Seen 8/20 in HF Clinic for one visit. Bedside echo showed EF 20% and RV moderately HK. Reported only NYHA II symptoms but was markedly tachycardic. Advised on need for R/L cath  but he never followed up. DVT felt to be likely provoked in setting of immobility when he first had HF.   Admitted 5/22 w/ acute on chronic systolic heart failure and acute RLL segmental PE and large rt pleural effusion requiring thoracentesis. LE doppler negative for PE. Repeat echo showed EF 10-15% w/ RV dilated  and severely HK. R/LHC 5/27 with minimal CAD and severe NICM, mildly elevated filling pressures and relatively preserved CO, 5.4, CI 2.5. cMRI was recommended but unable to get completed inpatient. Recs were to obtain as outpatient. He was placed on GDMT and transition from heparin gtt to Eliquis for PE. D/c wt 211 lb.    Unfortunately, he was lost to f/u and not seen since 5/22. Returns now for f/u. Echo completed today and shows low, but slightly improved EF ~25% w/ global HK. RV mildly enlarged and mildly reduced. Moderate MR noted w/ restricted posterior leaflet motion. Mild TR.    BP elevated, 160/90 (checked x 2), reports good control at home ~361W systolic. Pulse rate 102, EKG shows sinus tach 101 bpm. Wt 215 lb. He reports felling well w/ minimal symptoms, NYHA Class I-II. Denies any LEE. No orthopnea/PND. Reports full med compliance.    Lives by himself. Stays on the go. Coaches youth volleyball team. Uninsured.         1. Chronic Biventricular Heart Failure  - Suspect HTN CM  - Echo 5/22: EF 15% RV severely HK - cath 5/27 with minimal CAD. Severe NICM. Hemodynamics much better than expected. Fick cardiac output/index = 5.4/2.5 - has not had cMRI. Uninsured  - Echo today 10/12/21, EF ~25%, RV mildly reduced  - NYHA Class I-II. Euvolemic on exam. Tachycardic w/ elevated BP  - Add low dose Coreg, 3.125 mg bid  - Continue Entresto 49-51 bid - Continue Spiro 25 mg daily  - Continue digoxin 0.125 mg daily. Check Dig level  - No SGLT2i yet (Hgb A1c 11.6 in May). Repeat Hgb A1c today. If <10, plan to add next visit - BMP today    2. Hypertension  - Moderately elevated - GDMT  per above - add Coreg 3.125 bid today    3. H/o RLL Subsegmental PE - Diagnosed 5/22, LE Dopplers negative for DVT  - Fully compliant w/ Eliquis    4. 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 Type 2 DM - poorly controlled, last Hgb A1c 11.6  - repeat Hgb A1c today, if < 10 plan to add SLGT2i at f/u  - c/w glipizide per PCP    Patient states he is trying to improve his diet.  Note his  Dopplers were negative for deep venous thrombosis but pulmonary emboli right lower lobe present.  He remains compliant with Eliquis.   He could not get the [place] because of finances.  He does maintain the glipizide.  He has no complaints today.  He agrees to receive both the pneumonia and flu vaccine at this visit.   He needs a foot exam and an eye exam.  He needs microalbumin testing at this visit.  Also colon cancer screening is needed   Note the patient did have a tetanus vaccine 6 years ago  12/21/21 Did not proess fit.  Did not drop urine in 10/2021 needs EYE  Past Medical History:  Diagnosis Date   CHF (congestive heart failure) (HCC)    Chronic congestive heart failure ([place]) 08/27/2019   Renal disorder    kidney stones    Past Surgical History:  Procedure Laterality Date   RIGHT/LEFT HEART CATH AND CORONARY ANGIOGRAPHY N/A 05/08/2021   Procedure: RIGHT/LEFT HEART CATH AND CORONARY ANGIOGRAPHY;  Surgeon: Jolaine Artist, MD;  Location: [facility];  Service: Cardiovascular;  Laterality: N/A;    No family history on file.  Social History   Socioeconomic History   Marital status: Single    Spouse name: Not on file   Number of children: Not on file   Years of education: Not on file   Highest education level: Not on file  Occupational History   Not on file  Tobacco Use   Smoking status: Never   Smokeless tobacco: Never  Substance and Sexual Activity   Alcohol use: Not on file   Drug use: Not on file   Sexual activity:  Not on file  Other Topics Concern   Not on file  Social History Narrative   Not on file   Social Determinants of Health   Financial Resource Strain: Medium Risk   Difficulty of Paying Living Expenses: Somewhat hard  Food Insecurity: Food Insecurity Present   Worried About Running Out of Food in the Last Year: Sometimes true   Ran Out of Food in the Last Year: Sometimes true  Transportation Needs: No Transportation Needs   Lack of Transportation (Medical): No   Lack of Transportation (Non-Medical): No  Physical Activity: Not on file  Stress: Not on file  Social Connections: Not on file  Intimate Partner Violence: Not on file    Outpatient Medications Prior to Visit  Medication Sig Dispense Refill   apixaban (ELIQUIS) 5 MG TABS tablet Take 1 tablet (5 mg total) by mouth 2 (two) times daily. 60 tablet 2   atorvastatin (LIPITOR) 10 MG tablet Take 1 tablet (10 mg total) by mouth daily. 90 tablet 3   blood glucose meter kit and supplies KIT Dispense based on patient and insurance preference. Use up to four times daily as directed. 1 each 0   carvedilol (COREG) 3.125 MG tablet Take 1 tablet (3.125 mg total) by mouth 2 (two) times daily. (Patient not taking: Reported on 10/19/2021) 60 tablet 11   ciclopirox (PENLAC) 8 % solution Apply topically at bedtime. Apply over nail and surrounding skin. Apply daily over previous coat. After seven (7) days, may remove with alcohol and continue cycle. 6.6 mL 0   digoxin (LANOXIN) 0.125 MG tablet Take 1 tablet (0.125 mg total) by mouth daily. 60 tablet 4   empagliflozin (JARDIANCE) 10 MG TABS tablet Take 1 tablet (10 mg total) by mouth daily before breakfast. 30 tablet 4   furosemide (LASIX) 40 MG tablet  Take 1 tablet (40 mg total) by mouth 2 (two) times daily. 90 tablet 2   glipiZIDE (GLUCOTROL) 10 MG tablet Take 1 tablet (10 mg total) by mouth 2 (two) times daily before a meal. 60 tablet 3   sacubitril-valsartan (ENTRESTO) 49-51 MG Take  1 tablet by mouth 2 (two) times daily. 60 tablet 3   spironolactone (ALDACTONE) 25 MG tablet Take 1 tablet (25 mg total) by mouth daily. 60 tablet 3   No facility-administered medications prior to visit.    No Known Allergies  ROS Review of Systems    Objective:    Physical Exam  There were no vitals taken for this visit. Wt Readings from Last 3 Encounters:  10/19/21 211 lb (95.7 kg)  10/12/21 215 lb 3.2 oz (97.6 kg)  06/17/21 197 lb 3.2 oz (89.4 kg)     Health Maintenance Due  Topic Date Due   OPHTHALMOLOGY EXAM  Never done   COLON CANCER SCREENING ANNUAL FOBT  Never done   Zoster Vaccines- Shingrix (1 of 2) Never done    There are no preventive care reminders to display for this patient.  Lab Results  Component Value Date   TSH 2.149 05/07/2021   Lab Results  Component Value Date   WBC 6.3 05/09/2021   HGB 13.3 05/09/2021   HCT 41.8 05/09/2021   MCV 87.1 05/09/2021   PLT 167 05/09/2021   Lab Results  Component Value Date   NA 140 10/12/2021   K 4.5 10/12/2021   CO2 28 10/12/2021   GLUCOSE 158 (H) 10/12/2021   BUN 19 10/12/2021   CREATININE 1.31 (H) 10/12/2021   BILITOT 0.7 06/17/2021   ALKPHOS 84 06/17/2021   AST 14 06/17/2021   ALT 18 06/17/2021   PROT 7.9 06/17/2021   ALBUMIN 4.6 06/17/2021   CALCIUM 9.6 10/12/2021   ANIONGAP 9 10/12/2021   EGFR 56 (L) 06/17/2021   Lab Results  Component Value Date   CHOL 144 05/06/2021   Lab Results  Component Value Date   HDL 27 (L) 05/06/2021   Lab Results  Component Value Date   LDLCALC 97 05/06/2021   Lab Results  Component Value Date   TRIG 102 05/06/2021   Lab Results  Component Value Date   CHOLHDL 5.3 05/06/2021   Lab Results  Component Value Date   HGBA1C 7.4 (H) 10/12/2021      Assessment & Plan:   Problem List Items Addressed This Visit   None   No orders of the defined types were placed in this encounter.   Follow-up: No follow-ups on file.    Asencion Noble,  MD

## 2021-12-21 ENCOUNTER — Ambulatory Visit: Payer: Self-pay | Admitting: Critical Care Medicine

## 2021-12-22 ENCOUNTER — Other Ambulatory Visit: Payer: Self-pay

## 2021-12-23 ENCOUNTER — Other Ambulatory Visit: Payer: Self-pay

## 2021-12-25 ENCOUNTER — Other Ambulatory Visit: Payer: Self-pay

## 2021-12-29 ENCOUNTER — Encounter (HOSPITAL_COMMUNITY): Payer: Self-pay | Admitting: Internal Medicine

## 2022-01-08 ENCOUNTER — Telehealth (HOSPITAL_COMMUNITY): Payer: Self-pay | Admitting: Vascular Surgery

## 2022-01-08 NOTE — Telephone Encounter (Signed)
[facility] fd[facility] to move 3/6 appt to 3/9 due to provider being out of office, the # in system is out of service will send pt new appt in mail

## 2022-02-02 ENCOUNTER — Other Ambulatory Visit: Payer: Self-pay

## 2022-02-15 ENCOUNTER — Other Ambulatory Visit: Payer: Self-pay

## 2022-02-15 ENCOUNTER — Ambulatory Visit: Payer: Self-pay | Admitting: Critical Care Medicine

## 2022-02-15 ENCOUNTER — Encounter (HOSPITAL_COMMUNITY): Payer: Self-pay | Admitting: Internal Medicine

## 2022-02-18 ENCOUNTER — Ambulatory Visit (HOSPITAL_COMMUNITY)
Admission: RE | Admit: 2022-02-18 | Discharge: 2022-02-18 | Disposition: A | Discharge status: Home / Self Care | Payer: Self-pay | Source: Ambulatory Visit | Attending: Internal Medicine | Admitting: Internal Medicine

## 2022-02-18 ENCOUNTER — Other Ambulatory Visit: Payer: Self-pay

## 2022-02-18 VITALS — BP 168/98 | HR 90 | Wt 213.2 lb

## 2022-02-18 DIAGNOSIS — Z7901 Long term (current) use of anticoagulants: Secondary | ICD-10-CM | POA: Insufficient documentation

## 2022-02-18 DIAGNOSIS — I2510 Atherosclerotic heart disease of native coronary artery without angina pectoris: Secondary | ICD-10-CM | POA: Insufficient documentation

## 2022-02-18 DIAGNOSIS — E1165 Type 2 diabetes mellitus with hyperglycemia: Secondary | ICD-10-CM

## 2022-02-18 DIAGNOSIS — I10 Essential (primary) hypertension: Secondary | ICD-10-CM

## 2022-02-18 DIAGNOSIS — I130 Hypertensive heart and chronic kidney disease with heart failure and stage 1 through stage 4 chronic kidney disease, or unspecified chronic kidney disease: Secondary | ICD-10-CM | POA: Insufficient documentation

## 2022-02-18 DIAGNOSIS — N1832 Chronic kidney disease, stage 3b: Secondary | ICD-10-CM | POA: Insufficient documentation

## 2022-02-18 DIAGNOSIS — E1122 Type 2 diabetes mellitus with diabetic chronic kidney disease: Secondary | ICD-10-CM | POA: Insufficient documentation

## 2022-02-18 DIAGNOSIS — Z86718 Personal history of other venous thrombosis and embolism: Secondary | ICD-10-CM | POA: Insufficient documentation

## 2022-02-18 DIAGNOSIS — Z86711 Personal history of pulmonary embolism: Secondary | ICD-10-CM | POA: Insufficient documentation

## 2022-02-18 DIAGNOSIS — I428 Other cardiomyopathies: Secondary | ICD-10-CM | POA: Insufficient documentation

## 2022-02-18 DIAGNOSIS — I5082 Biventricular heart failure: Secondary | ICD-10-CM | POA: Insufficient documentation

## 2022-02-18 DIAGNOSIS — I5022 Chronic systolic (congestive) heart failure: Secondary | ICD-10-CM

## 2022-02-18 MED ORDER — CARVEDILOL 6.25 MG PO TABS
3.1250 mg | ORAL_TABLET | Freq: Two times a day (BID) | ORAL | 6 refills | Status: DC
  Filled 2022-02-18: qty 30, 30d supply, fill #0
  Filled 2022-04-25 – 2022-05-05 (×2): qty 30, 30d supply, fill #1
  Filled 2022-06-20: qty 90, 90d supply, fill #2
  Filled 2022-09-01: qty 30, 30d supply, fill #3

## 2022-02-18 NOTE — Addendum Note (Signed)
Encounter addended by: Scarlette Calico, RN on: 02/18/2022 2:17 PM ? Actions taken: Vitals modified, Medication long-term status modified, Pharmacy for encounter modified, Order list changed, Diagnosis association updated, Clinical Note Signed

## 2022-02-18 NOTE — Progress Notes (Signed)
?Advanced Heart Failure Clinic Note  ? ?Referring Physician: ?PCP: Kurt Stain, Kurt Woodard ?PCP-Cardiologist: Kurt Woodard  ? ?Reason for Visit: f/u for biventricular heart failure  ? ?HPI: ? ?Kurt Woodard is a 61 y/o male with a h/o HTN, CKD 3b (Scr 1.8), DVT and systolic CHF.  ?  ?Found to have DVT in 2018. He then developed fluid overload and had echo at OSH in 2019 and was told heart was operating at 20%. Underwent diuresis and felt better.  Did not have cath or repeat u/s. Says he got scared and decided not to f/u.  ?  ?Seen 8/20 in HF Clinic for one visit. Bedside echo showed EF 20% and RV moderately HK. Reported only NYHA II symptoms but was markedly tachycardic. Advised on need for R/L cath but he never followed up. DVT felt to be likely provoked in setting of immobility when he first had HF. ? ?Admitted 5/22 w/ acute on chronic systolic heart failure and acute RLL segmental PE and large rt pleural effusion requiring thoracentesis. LE doppler negative for PE. Repeat echo showed EF 10-15% w/ RV dilated  and severely HK. R/LHC 5/27 with minimal CAD and severe NICM, mildly elevated filling pressures and relatively preserved CO, 5.4, CI 2.5. cMRI was recommended but unable to get completed inpatient. D/c wt 211 lb.  ? ?Echo 10/22 EF ~25% w/ global HK. RV mildly enlarged and mildly reduced. Moderate MR noted w/ restricted posterior leaflet motion. Mild TR.  ? ?Here for f/u. Lives by himself.  Feels great. Coaches youth volleyball team. Uninsured. Denies SOB, CP, orthopnea or PND. Checks BP every other day 130-140/70s. Compliant with meds  ? ? ?2D Echo 04/2021 ?LVEF 10-15%, RV severely reduced, Mod MR, Mod-severe TR ? ?Echo 10/12/21 (Today) ?LVEF 25%, RV mildly reduced, Mod MR w/ restricted posterior leaflet motion.   ? ?R/LHC 04/2021 ?  ?RPDA lesion is 50% stenosed. ?1st Diag lesion is 20% stenosed. ?Dist LAD lesion is 20% stenosed. ?  ?Findings: ?  ?Ao = 108/71 (88) ?LV = 112/22 ?RA =  10 ?RV = 59/14 ?PA = 57/22  (37) ?PCW = 14 ?Fick cardiac output/index = 5.4/2.5 ?PVR = 4.3 WU ?SVR = 1166 ?Ao sat = 99% ?PA sat = 71%, 72% ?  ?Assessment: ?1. Minimal CAD ?2. Severe NICM  ?3. Mild PAH ?4. Mildly elevated filling pressure with relatively preserved CO ?  ? ? ?Past Medical History:  ?Diagnosis Date  ? CHF (congestive heart failure) ([place])   ? Chronic congestive heart failure ([place]) 08/27/2019  ? Renal disorder   ? kidney stones  ? ? ?Current Outpatient Medications  ?Medication Sig Dispense Refill  ? apixaban (ELIQUIS) 5 MG TABS tablet Take 1 tablet (5 mg total) by mouth 2 (two) times daily. 60 tablet 2  ? atorvastatin (LIPITOR) 10 MG tablet Take 1 tablet (10 mg total) by mouth daily. 90 tablet 3  ? blood glucose meter kit and supplies KIT Dispense based on patient and insurance preference. Use up to four times daily as directed. 1 each 0  ? carvedilol (COREG) 3.125 MG tablet Take 1 tablet (3.125 mg total) by mouth 2 (two) times daily. 60 tablet 11  ? ciclopirox (PENLAC) 8 % solution Apply topically at bedtime. Apply over nail and surrounding skin. Apply daily over previous coat. After seven (7) days, may remove with alcohol and continue cycle. 6.6 mL 0  ? digoxin (LANOXIN) 0.125 MG tablet Take 1 tablet (0.125 mg total) by mouth daily. 60 tablet 4  ?  empagliflozin (JARDIANCE) 10 MG TABS tablet Take 1 tablet (10 mg total) by mouth daily before breakfast. 30 tablet 4  ? furosemide (LASIX) 40 MG tablet Take 1 tablet (40 mg total) by mouth 2 (two) times daily. 90 tablet 2  ? glipiZIDE (GLUCOTROL) 10 MG tablet Take 1 tablet (10 mg total) by mouth 2 (two) times daily before a meal. 60 tablet 3  ? sacubitril-valsartan (ENTRESTO) 49-51 MG Take 1 tablet by mouth 2 (two) times daily. 60 tablet 3  ? spironolactone (ALDACTONE) 25 MG tablet Take 1 tablet (25 mg total) by mouth daily. 60 tablet 3  ? ?No current facility-administered medications for this encounter.  ? ? ?No Known Allergies ? ?  ?Social History  ? ?Socioeconomic History  ? Marital  status: Single  ?  Spouse name: Not on file  ? Number of children: Not on file  ? Years of education: Not on file  ? Highest education level: Not on file  ?Occupational History  ? Not on file  ?Tobacco Use  ? Smoking status: Never  ? Smokeless tobacco: Never  ?Substance and Sexual Activity  ? Alcohol use: Not on file  ? Drug use: Not on file  ? Sexual activity: Not on file  ?Other Topics Concern  ? Not on file  ?Social History Narrative  ? Not on file  ? ?Social Determinants of Health  ? ?Financial Resource Strain: Medium Risk  ? Difficulty of Paying Living Expenses: Somewhat hard  ?Food Insecurity: Food Insecurity Present  ? Worried About Charity fundraiser in the Last Year: Sometimes true  ? Ran Out of Food in the Last Year: Sometimes true  ?Transportation Needs: No Transportation Needs  ? Lack of Transportation (Medical): No  ? Lack of Transportation (Non-Medical): No  ?Physical Activity: Not on file  ?Stress: Not on file  ?Social Connections: Not on file  ?Intimate Partner Violence: Not on file  ? ? ? No family history on file. ? ?Vitals:  ? 02/18/22 1353  ?BP: (!) 180/102  ?Pulse: 90  ?SpO2: 98%  ?Weight: 96.7 kg (213 lb 4 oz)  ? ? ? ?PHYSICAL EXAM: ?General:  Well appearing. No resp difficulty ?HEENT: normal ?Neck: supple. no JVD. Carotids 2+ bilat; no bruits. No lymphadenopathy or thryomegaly appreciated. ?Cor: PMI nondisplaced. Regular rate & rhythm. No rubs, gallops or murmurs. ?Lungs: clear ?Abdomen: soft, nontender, nondistended. No hepatosplenomegaly. No bruits or masses. Good bowel sounds. ?Extremities: no cyanosis, clubbing, rash, edema ?Neuro: alert & orientedx3, cranial nerves grossly intact. moves all 4 extremities w/o difficulty. Affect pleasant ? ?ASSESSMENT & PLAN: ? ?1. Chronic Biventricular Heart Failure  ?- Suspect HTN CM  ?- Echo 5/22: EF 15% RV severely HK ?- cath 5/27 with minimal CAD. Severe NICM. Hemodynamics much better than expected. Fick cardiac output/index = 5.4/2.5 ?- has not had  cMRI. Uninsured  ?- Echo 10/12/21, EF ~25%, RV mildly reduced  ?- NYHA Class I. Volume status  ok  ?- Increase carvedilol to 6.25 bid  ?- Continue Entresto 49-51 bid ?- Continue Spiro 25 mg daily  ?- Continue Jardiance 10 ?- Stop digoxin ?- Labs with Dr. Joya Gaskins in 2 weeks ?- Needs repeat echo ?  ?2. Hypertension  ?- Markedly elevated here but says 130-140 at me ?- GDMT per above ?  ?3. H/o RLL Subsegmental PE ?- Diagnosed 5/22, LE Dopplers negative for DVT  ?- Remains on Eliquis  ?  ?4. 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 ?5. Type 2 DM ?- much improved  Hgb A1c 11.6 -> 7.4 ?- Per PCP ? ? ?Kurt Bickers, Kurt Woodard ?02/18/22  ? ? ? ?

## 2022-02-18 NOTE — Patient Instructions (Signed)
Stop Digoxin ? ?Increase Carvedilol to 6.25 mg Twice daily  ? ?Your physician has requested that you have an echocardiogram. Echocardiography is a painless test that uses sound waves to create images of your heart. It provides your doctor with information about the size and shape of your heart and how well your heart?s chambers and valves are working. This procedure takes approximately one hour. There are no restrictions for this procedure. ? ?Your physician has requested that you regularly monitor and record your blood pressure readings at home. Please use the same machine at the same time of day to check your readings and record them to bring to your follow-up visit. ? ?Your physician recommends that you schedule a follow-up appointment in: 4 months ? ?Do the following things EVERYDAY: ?Weigh yourself in the morning before breakfast. Write it down and keep it in a log. ?Take your medicines as prescribed ?Eat low salt foods--Limit salt (sodium) to 2000 mg per day.  ?Stay as active as you can everyday ?Limit all fluids for the day to less than 2 liters ? ?If you have any questions or concerns before your next appointment please send [place] a message through [place] or call our office at [phone].   ? ?TO LEAVE A MESSAGE FOR THE NURSE SELECT OPTION 2, PLEASE LEAVE A MESSAGE INCLUDING: ?YOUR NAME ?DATE OF BIRTH ?CALL BACK NUMBER ?REASON FOR CALL**this is important as we prioritize the call backs ? ?YOU WILL RECEIVE A CALL BACK THE SAME DAY AS LONG AS YOU CALL BEFORE 4:00 PM ? ?At the [facility], you and your health needs are our priority. As part of our continuing mission to provide you with exceptional heart care, we have created designated Provider Care Teams. These Care Teams include your primary Cardiologist (physician) and Advanced Practice Providers (APPs- Physician Assistants and Nurse Practitioners) who all work together to provide you with the care you need, when you need it.  ? ?You may  see any of the following providers on your designated Care Team at your next follow up: ?Dr Glori Bickers ?Dr Loralie Champagne ?Darrick Grinder, NP ?Lyda Jester, PA ?Jessica Milford,NP ?Marlyce Huge, PA ?Audry Riles, PharmD ? ? ?Please be sure to bring in all your medications bottles to every appointment.  ? ? ?

## 2022-02-19 ENCOUNTER — Other Ambulatory Visit: Payer: Self-pay

## 2022-03-01 ENCOUNTER — Ambulatory Visit (HOSPITAL_COMMUNITY): Payer: Self-pay

## 2022-03-09 ENCOUNTER — Other Ambulatory Visit: Payer: Self-pay | Admitting: Pharmacist

## 2022-03-09 ENCOUNTER — Other Ambulatory Visit: Payer: Self-pay

## 2022-03-09 DIAGNOSIS — I2699 Other pulmonary embolism without acute cor pulmonale: Secondary | ICD-10-CM

## 2022-03-09 MED ORDER — APIXABAN 5 MG PO TABS: 5.0000 mg | ORAL_TABLET | Freq: Two times a day (BID) | ORAL | 0 refills | Status: DC

## 2022-03-24 ENCOUNTER — Ambulatory Visit (HOSPITAL_COMMUNITY): Admission: RE | Admit: 2022-03-24 | Payer: Self-pay | Source: Ambulatory Visit

## 2022-04-25 ENCOUNTER — Other Ambulatory Visit: Payer: Self-pay | Admitting: Physician Assistant

## 2022-04-25 DIAGNOSIS — E1165 Type 2 diabetes mellitus with hyperglycemia: Secondary | ICD-10-CM

## 2022-04-26 ENCOUNTER — Other Ambulatory Visit: Payer: Self-pay

## 2022-04-27 ENCOUNTER — Other Ambulatory Visit: Payer: Self-pay

## 2022-05-03 ENCOUNTER — Other Ambulatory Visit: Payer: Self-pay

## 2022-05-05 ENCOUNTER — Other Ambulatory Visit: Payer: Self-pay

## 2022-06-18 ENCOUNTER — Telehealth (HOSPITAL_COMMUNITY): Payer: Self-pay

## 2022-06-18 NOTE — Telephone Encounter (Signed)
Called patient and was unable to leave a voice mail to confirm/remind patient of their appointment at the [facility] on 06/21/22.

## 2022-06-21 ENCOUNTER — Ambulatory Visit (HOSPITAL_COMMUNITY)
Admission: RE | Admit: 2022-06-21 | Discharge: 2022-06-21 | Disposition: A | Discharge status: Home / Self Care | Payer: Self-pay | Source: Ambulatory Visit | Attending: Family Medicine | Admitting: Family Medicine

## 2022-06-21 ENCOUNTER — Other Ambulatory Visit (HOSPITAL_COMMUNITY): Payer: Self-pay

## 2022-06-21 ENCOUNTER — Other Ambulatory Visit: Payer: Self-pay

## 2022-06-21 ENCOUNTER — Encounter (HOSPITAL_COMMUNITY): Payer: Self-pay

## 2022-06-21 ENCOUNTER — Telehealth (HOSPITAL_COMMUNITY): Payer: Self-pay | Admitting: Cardiology

## 2022-06-21 VITALS — BP 102/76 | HR 84 | Wt 204.0 lb

## 2022-06-21 DIAGNOSIS — I130 Hypertensive heart and chronic kidney disease with heart failure and stage 1 through stage 4 chronic kidney disease, or unspecified chronic kidney disease: Secondary | ICD-10-CM | POA: Insufficient documentation

## 2022-06-21 DIAGNOSIS — I5022 Chronic systolic (congestive) heart failure: Secondary | ICD-10-CM

## 2022-06-21 DIAGNOSIS — E1122 Type 2 diabetes mellitus with diabetic chronic kidney disease: Secondary | ICD-10-CM | POA: Insufficient documentation

## 2022-06-21 DIAGNOSIS — I5023 Acute on chronic systolic (congestive) heart failure: Secondary | ICD-10-CM

## 2022-06-21 DIAGNOSIS — Z79899 Other long term (current) drug therapy: Secondary | ICD-10-CM | POA: Insufficient documentation

## 2022-06-21 DIAGNOSIS — I5082 Biventricular heart failure: Secondary | ICD-10-CM | POA: Insufficient documentation

## 2022-06-21 DIAGNOSIS — N1832 Chronic kidney disease, stage 3b: Secondary | ICD-10-CM | POA: Insufficient documentation

## 2022-06-21 DIAGNOSIS — E1165 Type 2 diabetes mellitus with hyperglycemia: Secondary | ICD-10-CM

## 2022-06-21 DIAGNOSIS — I428 Other cardiomyopathies: Secondary | ICD-10-CM | POA: Insufficient documentation

## 2022-06-21 DIAGNOSIS — Z86718 Personal history of other venous thrombosis and embolism: Secondary | ICD-10-CM | POA: Insufficient documentation

## 2022-06-21 DIAGNOSIS — Z139 Encounter for screening, unspecified: Secondary | ICD-10-CM

## 2022-06-21 DIAGNOSIS — Z7901 Long term (current) use of anticoagulants: Secondary | ICD-10-CM | POA: Insufficient documentation

## 2022-06-21 DIAGNOSIS — I2510 Atherosclerotic heart disease of native coronary artery without angina pectoris: Secondary | ICD-10-CM | POA: Insufficient documentation

## 2022-06-21 DIAGNOSIS — I10 Essential (primary) hypertension: Secondary | ICD-10-CM

## 2022-06-21 DIAGNOSIS — Z86711 Personal history of pulmonary embolism: Secondary | ICD-10-CM | POA: Insufficient documentation

## 2022-06-21 LAB — BASIC METABOLIC PANEL
Anion gap: 10 (ref 5–15)
BUN: 26 mg/dL — ABNORMAL HIGH (ref 6–20)
CO2: 25 mmol/L (ref 22–32)
Calcium: 8.7 mg/dL — ABNORMAL LOW (ref 8.9–10.3)
Chloride: 98 mmol/L (ref 98–111)
Creatinine, Ser: 1.66 mg/dL — ABNORMAL HIGH (ref 0.61–1.24)
GFR, Estimated: 47 mL/min — ABNORMAL LOW (ref >60)
Glucose, Bld: 512 mg/dL (ref 70–99)
Potassium: 4.5 mmol/L (ref 3.5–5.1)
Sodium: 133 mmol/L — ABNORMAL LOW (ref 135–145)

## 2022-06-21 LAB — CBC
HCT: 48.6 % (ref 39.0–52.0)
Hemoglobin: 17 g/dL (ref 13.0–17.0)
MCH: 31 pg (ref 26.0–34.0)
MCHC: 35 g/dL (ref 30.0–36.0)
MCV: 88.5 fL (ref 80.0–100.0)
Platelets: 220 10*3/uL (ref 150–400)
RBC: 5.49 MIL/uL (ref 4.22–5.81)
RDW: 13.2 % (ref 11.5–15.5)
WBC: 12 10*3/uL — ABNORMAL HIGH (ref 4.0–10.5)
nRBC: 0 % (ref 0.0–0.2)

## 2022-06-21 LAB — BRAIN NATRIURETIC PEPTIDE: B Natriuretic Peptide: 76 pg/mL (ref 0.0–100.0)

## 2022-06-21 MED ORDER — FUROSEMIDE 40 MG PO TABS: ORAL_TABLET | ORAL | 2 refills | Status: DC

## 2022-06-21 MED ORDER — DAPAGLIFLOZIN PROPANEDIOL 10 MG PO TABS
10.0000 mg | ORAL_TABLET | Freq: Every day | ORAL | 3 refills | Status: DC
  Filled 2022-06-21: qty 30, 30d supply, fill #0
  Filled 2022-09-01: qty 30, 30d supply, fill #1

## 2022-06-21 NOTE — Progress Notes (Signed)
Medication Samples have been provided to the patient.  Drug name: entresto       Strength: 49/51 mg        Qty: 28  LOT: [id number]  Exp.Date: 06/2024  Dosing instructions: one tab twice a day  The patient has been instructed regarding the correct time, dose, and frequency of taking this medication, including desired effects and most common side effects.   Garlan Fair M 2:02 PM 06/21/2022

## 2022-06-21 NOTE — Addendum Note (Signed)
Encounter addended by: Jorge Ny, LCSW on: 06/21/2022 3:06 PM  Actions taken: Pend clinical note

## 2022-06-21 NOTE — Progress Notes (Signed)
Advanced Heart Failure Clinic Note    PCP: Elsie Stain, MD HF Cardiologist: Dr. Haroldine Laws   Reason for Visit: f/u for biventricular heart failure   HPI: Kurt Woodard is a 61 y.o. male with a h/o HTN, CKD 3b (Scr 1.8), DVT and systolic CHF.    Found to have DVT in 2018. He then developed fluid overload and had echo at OSH in 2019 and was told heart was operating at 20%. Underwent diuresis and felt better.  Did not have cath or repeat u/s. Says he got scared and decided not to f/u.    Seen 8/20 in HF Clinic for one visit. Bedside echo showed EF 20% and RV moderately HK. Reported only NYHA II symptoms but was markedly tachycardic. Advised on need for R/L cath but he never followed up. DVT felt to be likely provoked in setting of immobility when he first had HF.  Admitted 5/22 w/ acute on chronic systolic heart failure and acute RLL segmental PE and large rt pleural effusion requiring thoracentesis. LE doppler negative for PE. Repeat echo showed EF 10-15% w/ RV dilated  and severely HK. R/LHC 5/27 with minimal CAD and severe NICM, mildly elevated filling pressures and relatively preserved CO, 5.4, CI 2.5. cMRI was recommended but unable to get completed inpatient. D/c wt 211 lb.   Echo 10/22 EF ~25% w/ global HK. RV mildly enlarged and mildly reduced. Moderate MR noted w/ restricted posterior leaflet motion. Mild TR.   Follow up 3/23, stable NYHA I, volume OK. Dig stopped and Coreg increased to 6.25 bid. Repeat echo arranged.  Today he returns for HF follow up. Overall feeling fine. 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 Denies palpitations, CP, dizziness, edema, or PND/Orthopnea. Appetite ok. No fever or chills. Weight at home 205 pounds. Taking all medications. Uninsured.   Cardiac Studies - Echo 04/2021: EF 10-15%, RV severely reduced, Mod MR, Mod-severe TR  - Echo 10/12/21: EF 25%, RV mildly reduced, Mod MR w/ restricted posterior leaflet motion.    - R/LHC  04/2021 RPDA lesion is 50% stenosed. 1st Diag lesion is 20% stenosed. Dist LAD lesion is 20% stenosed.   Ao = 108/71 (88) LV = 112/22 RA =  10 RV = 59/14 PA = 57/22 (37) PCW = 14 Fick cardiac output/index = 5.4/2.5 PVR = 4.3 WU SVR = 1166 Ao sat = 99% PA sat = 71%, 72%   Assessment: 1. Minimal CAD 2. Severe NICM  3. Mild PAH 4. Mildly elevated filling pressure with relatively preserved CO    ROS: All systems reviewed and negative except as per HPI.   Past Medical History:  Diagnosis Date   CHF (congestive heart failure) ([facility])    Chronic congestive heart failure ([place]) 08/27/2019   Renal disorder    kidney stones    Current Outpatient Medications  Medication Sig Dispense Refill   apixaban (ELIQUIS) 5 MG TABS tablet Take 1 tablet (5 mg total) by mouth 2 (two) times daily. 60 tablet 0   atorvastatin (LIPITOR) 10 MG tablet Take 1 tablet (10 mg total) by mouth daily. 90 tablet 3   blood glucose meter kit and supplies KIT Dispense based on patient and insurance preference. Use up to four times daily as directed. 1 each 0   carvedilol (COREG) 6.25 MG tablet Take 0.5 tablets (3.125 mg total) by mouth 2 (two) times daily. 60 tablet 6   empagliflozin (JARDIANCE) 10 MG TABS tablet Take 1 tablet (10 mg total) by mouth daily  before breakfast. 30 tablet 4   furosemide (LASIX) 40 MG tablet Take 1 tablet (40 mg total) by mouth 2 (two) times daily. 90 tablet 2   glipiZIDE (GLUCOTROL) 10 MG tablet Take 1 tablet (10 mg total) by mouth 2 (two) times daily before a meal. 60 tablet 3   sacubitril-valsartan (ENTRESTO) 49-51 MG Take 1 tablet by mouth 2 (two) times daily. 60 tablet 3   spironolactone (ALDACTONE) 25 MG tablet Take 1 tablet (25 mg total) by mouth daily. 60 tablet 3   No current facility-administered medications for this encounter.   No Known Allergies  Social History   Socioeconomic History   Marital status: Single    Spouse name: Not on file   Number of children: Not on  file   Years of education: Not on file   Highest education level: Not on file  Occupational History   Not on file  Tobacco Use   Smoking status: Never   Smokeless tobacco: Never  Substance and Sexual Activity   Alcohol use: Not on file   Drug use: Not on file   Sexual activity: Not on file  Other Topics Concern   Not on file  Social History Narrative   Not on file   Social Determinants of Health   Financial Resource Strain: Medium Risk (05/08/2021)   Overall Financial Resource Strain (CARDIA)    Difficulty of Paying Living Expenses: Somewhat hard  Food Insecurity: Food Insecurity Present (05/08/2021)   Hunger Vital Sign    Worried About Running Out of Food in the Last Year: Sometimes true    Ran Out of Food in the Last Year: Sometimes true  Transportation Needs: No Transportation Needs (05/08/2021)   PRAPARE - Hydrologist (Medical): No    Lack of Transportation (Non-Medical): No  Physical Activity: Not on file  Stress: Not on file  Social Connections: Not on file  Intimate Partner Violence: Not on file   No family history on file.  BP 102/76   Pulse 84   Wt 92.5 kg (204 lb)   SpO2 96%   BMI 29.27 kg/m   Wt Readings from Last 3 Encounters:  06/21/22 92.5 kg (204 lb)  02/18/22 96.7 kg (213 lb 4 oz)  10/19/21 95.7 kg (211 lb)   PHYSICAL EXAM: General:  NAD. No resp difficulty HEENT: Normal Neck: Supple. No JVD. Carotids 2+ bilat; no bruits. No lymphadenopathy or thryomegaly appreciated. Cor: PMI nondisplaced. Regular rate & rhythm. No rubs, gallops or murmurs. Lungs: Clear Abdomen: Obese, nontender, nondistended. No hepatosplenomegaly. No bruits or masses. Good bowel sounds. Extremities: No cyanosis, clubbing, rash, edema Neuro: Alert & oriented x 3, cranial nerves grossly intact. Moves all 4 extremities w/o difficulty. Affect pleasant.  ECG (personally reviewed): NSR 84 bpm  ASSESSMENT & PLAN: 1. Chronic Biventricular Heart Failure   - Suspect HTN CM  - Echo (5/22): EF 15% RV severely HK - cath 5/27 with minimal CAD. Severe NICM. Hemodynamics much better than expected. Fick cardiac output/index = 5.4/2.5 - has not had cMRI. Uninsured.  - Echo (10/12/21): EF ~25%, RV mildly reduced.  - NYHA I. Volume status ok.  - Continue Lasix 40 mg bid. - Continue carvedilol 3.125 mg bid.  - Continue Entresto 49-51 mg bid. Start patient assistance. Samples given today - Continue spiro 25 mg daily.  - Continue Farxiga 10 mg daily (switched from [place] for HF fund). - Needs repeat echo. - Labs today.   2. Hypertension  -  Stable. - GDMT per above   3. H/o RLL Subsegmental PE - Diagnosed 5/22, LE Dopplers negative for DVT  - Continue Eliquis. No bleeding issues. - CBC today.   4. 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 Type 2 DM - much improved  Hgb A1c 11.6 -> 7.4. - Per PCP  6. SDOH - uninsured. Engage HFSW - Meds thru HF fund, patient assistance for Entresto started.  Follow up in 3 months with Dr. Haroldine Laws + echo.  [place], FNP 06/21/22

## 2022-06-21 NOTE — Patient Instructions (Addendum)
STOP Jardiance START Farxiga 10 mg one tab daily  Labs today We will only contact you if something comes back abnormal or we need to make some changes. Otherwise no news is good news!  Your physician wants you to follow-up in:3 months with Dr Haroldine Laws and echo  You will receive a reminder letter in the mail two months in advance. If you don't receive a letter, please call our office to schedule the follow-up appointment.   Your physician has requested that you have an echocardiogram. Echocardiography is a painless test that uses sound waves to create images of your heart. It provides your doctor with information about the size and shape of your heart and how well your heart's chambers and valves are working. This procedure takes approximately one hour. There are no restrictions for this procedure.  Do the following things EVERYDAY: Weigh yourself in the morning before breakfast. Write it down and keep it in a log. Take your medicines as prescribed Eat low salt foods--Limit salt (sodium) to 2000 mg per day.  Stay as active as you can everyday Limit all fluids for the day to less than 2 liters  At the [facility], you and your health needs are our priority. As part of our continuing mission to provide you with exceptional heart care, we have created designated Provider Care Teams. These Care Teams include your primary Cardiologist (physician) and Advanced Practice Providers (APPs- Physician Assistants and Nurse Practitioners) who all work together to provide you with the care you need, when you need it.   You may see any of the following providers on your designated Care Team at your next follow up: Dr Glori Bickers Dr Haynes Kerns, NP Lyda Jester, [place] [facility] [place], [place] Audry Riles, PharmD   Please be sure to bring in all your medications bottles to every appointment.   If you have any questions or concerns before your next  appointment please send [place] a message through [place] or call our office at [phone].    TO LEAVE A MESSAGE FOR THE NURSE SELECT OPTION 2, PLEASE LEAVE A MESSAGE INCLUDING: YOUR NAME DATE OF BIRTH CALL BACK NUMBER REASON FOR CALL**this is important as we prioritize the call backs  YOU WILL RECEIVE A CALL BACK THE SAME DAY AS LONG AS YOU CALL BEFORE 4:00 PM

## 2022-06-21 NOTE — Addendum Note (Signed)
Encounter addended by: Kerry Dory, CMA on: 06/21/2022 2:06 PM  Actions taken: Clinical Note Signed

## 2022-06-21 NOTE — Telephone Encounter (Signed)
Patient called.  Patient aware. Reports he will check cbg once he gets home, if still elevated will report to er. Aware of med changes and will return call to schedule lab appt

## 2022-06-21 NOTE — Progress Notes (Signed)
Heart and Vascular Care Navigation  06/21/2022  Kurt Woodard 1961-01-02 [id number]  Reason for Referral: lack of insurance   Engaged with patient face to face for initial visit for Heart and Vascular Care Coordination.                                                                                                   Assessment:   CSW consulted to meet with pt regarding current lack of insurance.  Pt reports he hasn't worked regularly since 2018 and since that time has mainly done short term under the table work.  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  Pt considered applying for disability but doesn't think anything moved forward with this- states he gets confused doing things on the internet.  Pt has no funds to get insurance through [facility] plus outside the open enrollment period.  Pt is over asset limit for Medicaid (has $7000 in savings).  Pt provided with CAFA to help with [facility] bills and help pay for future testing- encouraged him to have son assist him with application.  Pt admits to some food insecurity at home.  CSW assisted in making phone appt with [employer] [facility] tomorrow for 2pm to assist with applying for food stamps.  CSW provided with Heart and Vascular food pantry bag to assist with current food insecurity at home.  Pt reports he has no housing insecurity- knows his landlord and landlord has assured him he will work with him so he doesn't become homeless when he runs out of savings.  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  CSW placed referral to [facility] so they could assist him through the process.                                   HRT/VAS Care Coordination     Living arrangements for the past 2 months Single Family Home       Social History:                                                                              SDOH Screenings   Alcohol Screen: Not on file  Depression (PHQ2-9): Low Risk  (06/17/2021)   Depression (PHQ2-9)    PHQ-2 Score: 0  Financial Resource Strain: High Risk (06/21/2022)   Overall Financial Resource Strain (CARDIA)    Difficulty of Paying Living Expenses: Hard  Food Insecurity: Food Insecurity Present (06/21/2022)   Hunger Vital Sign    Worried About Charity fundraiser in  the Last Year: Sometimes true    Ran Out of Food in the Last Year: Sometimes true  Housing: Low Risk  (05/08/2021)   Housing    Last Housing Risk Score: 0  Physical Activity: Not on file  Social Connections: Not on file  Stress: Not on file  Tobacco Use: Low Risk  (06/21/2022)   Patient History    Smoking Tobacco Use: Never    Smokeless Tobacco Use: Never    Passive Exposure: Not on file  Transportation Needs: No Transportation Needs (05/08/2021)   PRAPARE - Transportation    Lack of Transportation (Medical): No    Lack of Transportation (Non-Medical): No    SDOH Interventions: Financial Resources:  Financial Strain Interventions: Other (Comment) (servant center disability app) Occupational hygienist for [place]:  Food Insecurity Interventions: Assist with [employer], Other (Comment) (Food bag provided)  Housing Insecurity:   None reported  Transportation:   None reported    Follow-up plan:    Pt to apply for disability with help of [employer].  Pt to apply for CAFA and SNAP  CSW will follow up with pt regarding progress with above.  Jorge Ny, LCSW Clinical Social Worker Advanced Heart Failure Clinic Desk#: [phone] Cell#: [phone]

## 2022-06-21 NOTE — Telephone Encounter (Signed)
-----   Message from Rafael Bihari, [place] sent at 06/21/2022  3:09 PM EDT ----- Glucose too high. Please call and have him check his blood sugar.   Kidney function mildly elevated.   Hold Lasix, Entresto and spiro and Jardiance x 1 day.  Resume Entresto 49/51 bid (home dose), spiro 25 mg (home dose),  jardiance 10 mg daily, and resume Lasix at 40 q am/20 mg q pm.  Repeat BMET in 1 week

## 2022-06-21 NOTE — Addendum Note (Signed)
Encounter addended by: Jorge Ny, LCSW on: 06/21/2022 4:13 PM  Actions taken: Flowsheet accepted, Clinical Note Signed

## 2022-06-25 ENCOUNTER — Telehealth (HOSPITAL_COMMUNITY): Payer: Self-pay

## 2022-06-25 NOTE — Telephone Encounter (Signed)
Reached out and spoke with pt regarding PAP paperwork, patient signed at visit. Informed patient he will need to provide federal tax income documents. Patient expressed understanding. Will f/u once paperwork has been signed and returned.

## 2022-06-29 ENCOUNTER — Telehealth (HOSPITAL_COMMUNITY): Payer: Self-pay | Admitting: Pharmacy Technician

## 2022-06-29 ENCOUNTER — Other Ambulatory Visit (HOSPITAL_COMMUNITY): Payer: Self-pay | Admitting: *Deleted

## 2022-06-29 ENCOUNTER — Other Ambulatory Visit (HOSPITAL_COMMUNITY): Payer: Self-pay

## 2022-06-29 DIAGNOSIS — I5023 Acute on chronic systolic (congestive) heart failure: Secondary | ICD-10-CM

## 2022-06-29 MED ORDER — ENTRESTO 49-51 MG PO TABS: 1.0000 | ORAL_TABLET | Freq: Two times a day (BID) | ORAL | 3 refills | Status: DC

## 2022-06-29 NOTE — Telephone Encounter (Signed)
Advanced Heart Failure Patient Advocate Encounter  Sent in [place] assistance application to [employer] via fax. POI still needed. Document scanned to chart.

## 2022-07-02 ENCOUNTER — Telehealth (HOSPITAL_COMMUNITY): Payer: Self-pay | Admitting: Licensed Clinical Social Worker

## 2022-07-02 NOTE — Telephone Encounter (Signed)
CSW called to check in regarding referrals CSW made for food stamp assistance, disability application, and CAFA for [facility] bills.  Pt reports he started food stamp application with [employer] appt that CSW made- now has pending food stamps.  Pt states he has not heard from [facility] referral yet and has not started CAFA- encouraged him to work on this application and CSW will plan to check in over the next few weeks regarding progress.  Jorge Ny, LCSW Clinical Social Worker [facility] Desk#: [phone] Cell#: [phone]

## 2022-07-16 ENCOUNTER — Telehealth (HOSPITAL_COMMUNITY): Payer: Self-pay | Admitting: Licensed Clinical Social Worker

## 2022-07-16 NOTE — Telephone Encounter (Signed)
CSW attempted to call pt to check in regarding assistance application status- unable to reach or leave VM  Kurt Woodard, [facility] Desk#: [phone] Cell#: [phone]

## 2022-07-23 NOTE — Telephone Encounter (Signed)
Advanced Heart Failure Patient Advocate Encounter  Called [employer] to check the status of the patient's application. Representative stated that POI is still needed. They reached out to the patient with no way to leave a message.   I attempted to call the patient. Was unable to leave message.  Will be here to assist in the future, as needed.  Charlann Boxer, CPhT

## 2022-08-11 IMAGING — CT CT ANGIO CHEST
2 of 7 series · 17 of 46 positions shown · IV contrast (APPLIED)
Comparison: August 03, 2018.

CLINICAL DATA: Suspected pulmonary embolism in a 59-year-old male

EXAM:
CT ANGIOGRAPHY CHEST WITH CONTRAST
TECHNIQUE: Multidetector CT imaging of the chest was performed using the
standard protocol during bolus administration of intravenous
contrast. Multiplanar CT image reconstructions and MIPs were
obtained to evaluate the vascular anatomy.
CONTRAST:  75mL OMNIPAQUE IOHEXOL 350 MG/ML SOLN

[Series 6: thins · axial · 0.75mm/px · z∈[+1185,+1449]mm · 14 of 426 slices shown]
[im 24/426  lung]
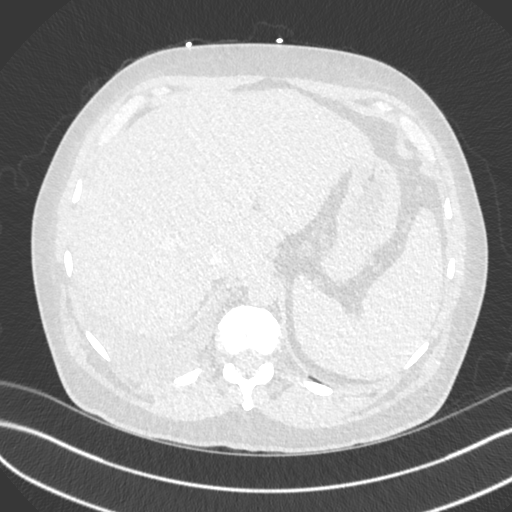
[im 48/426  soft-tissue]
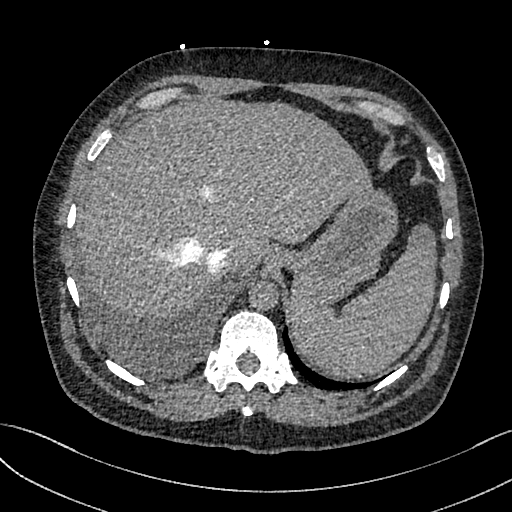
[im 95/426  lung]
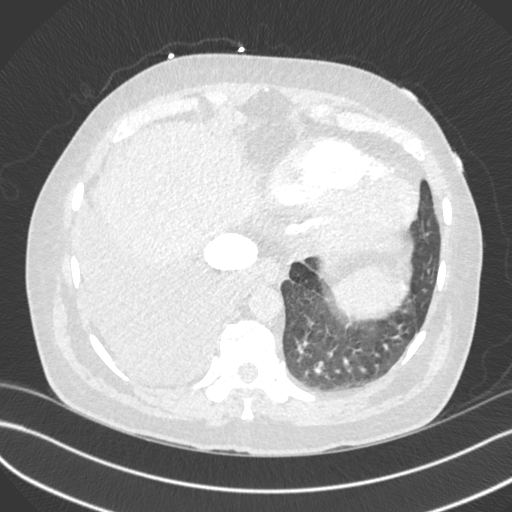
[im 119/426  soft-tissue]
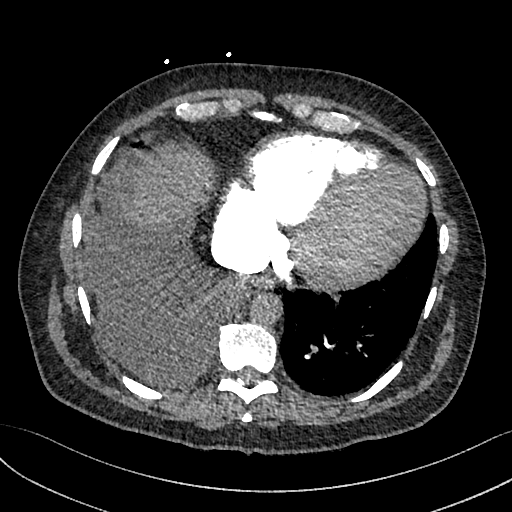
[im 142/426  lung]
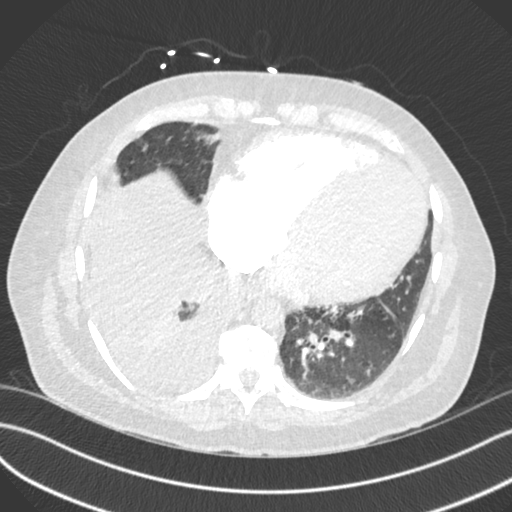
[im 166/426  soft-tissue]
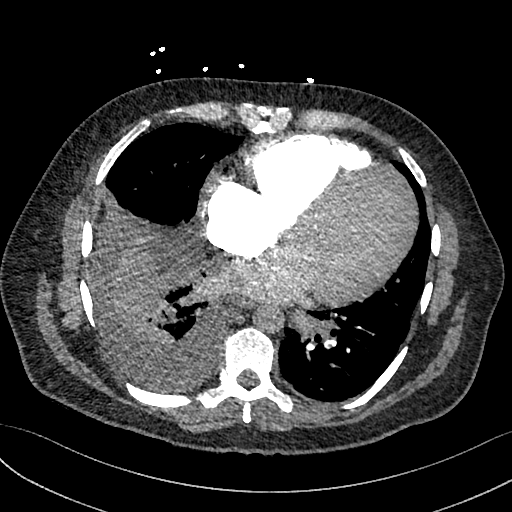
[im 189/426  lung]
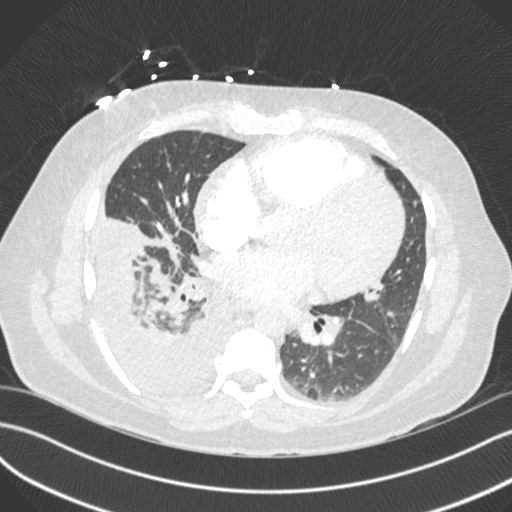
[im 237/426  soft-tissue]
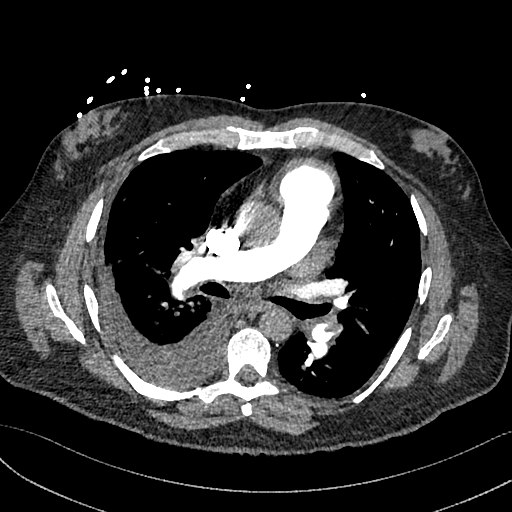
[im 260/426  lung]
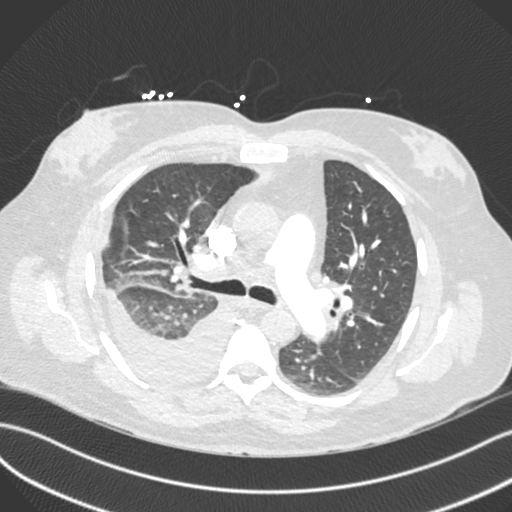
[im 284/426  soft-tissue]
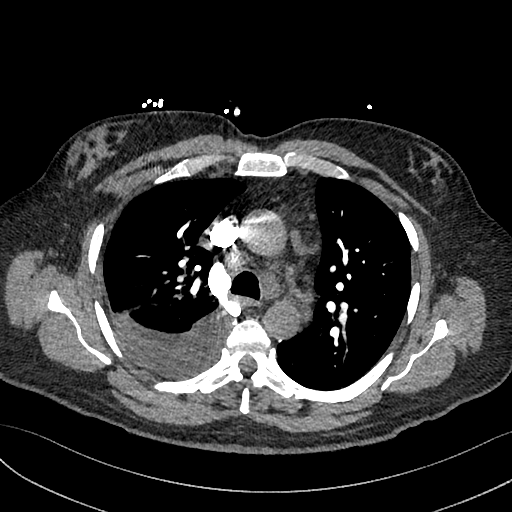
[im 307/426  lung]
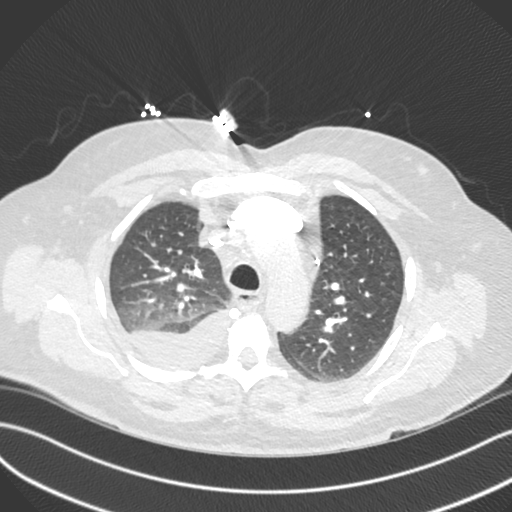
[im 331/426  soft-tissue]
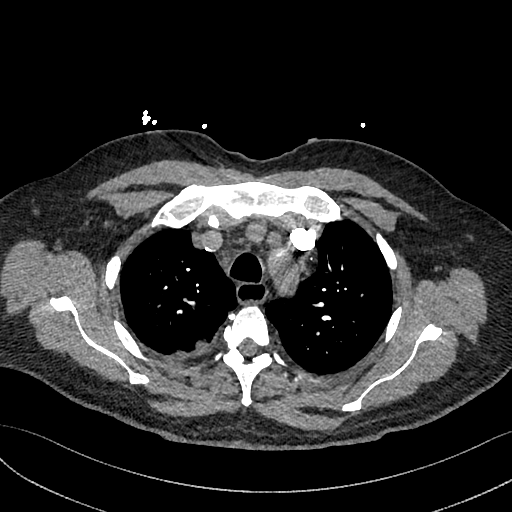
[im 378/426  lung]
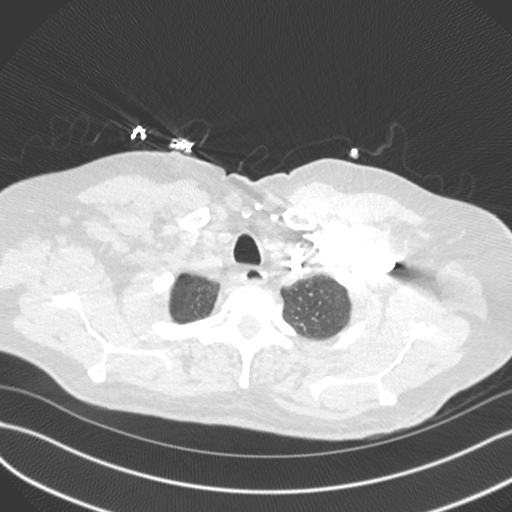
[im 402/426  soft-tissue]
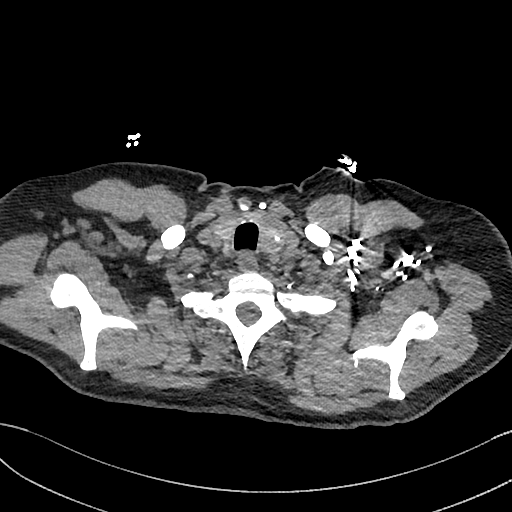

[Series 8: cor · coronal · 0.58mm/px · 3 of 148 slices shown]
[im 37/148  soft-tissue]
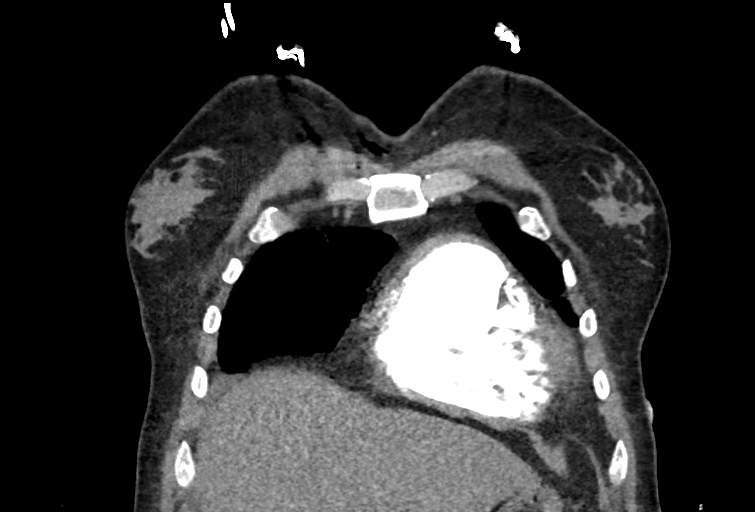
[im 74/148  soft-tissue]
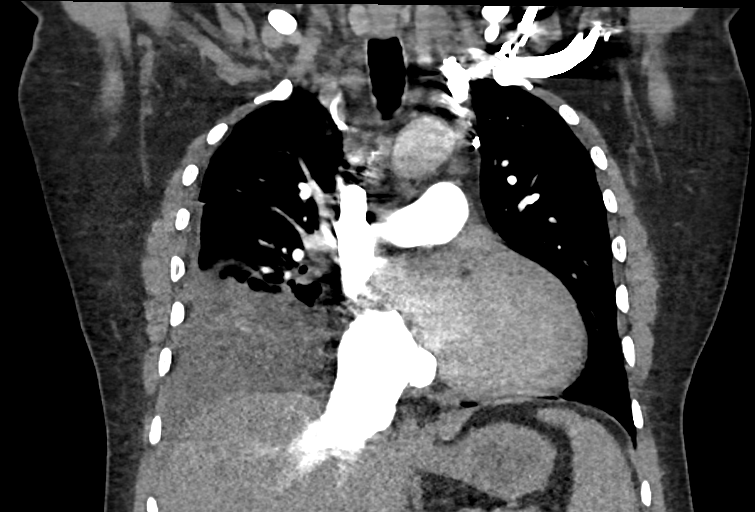
[im 111/148  soft-tissue]
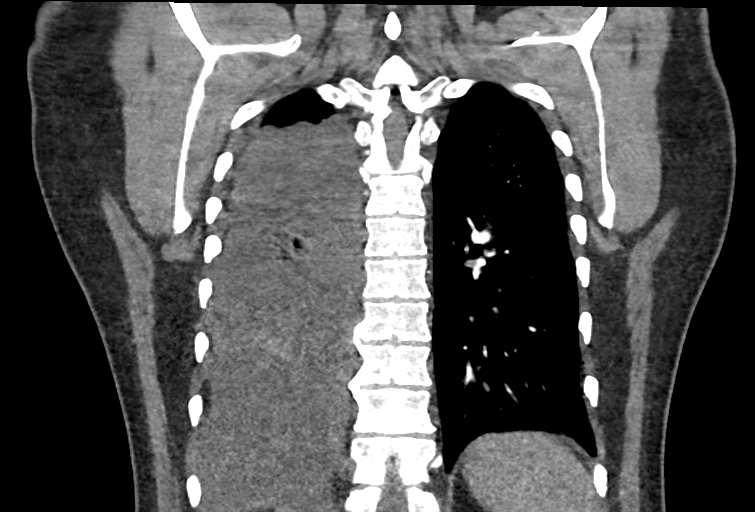

[17 of 46 positions shown; findings below may reference images not displayed]

FINDINGS: Cardiovascular: Normal caliber of the thoracic aorta. No aneurysmal
dilation.

Heart size is enlarged similar to the prior study. No substantial
pericardial effusion. Exam is optimized for evaluation of the
pulmonary vascular bed.

No central pulmonary embolism.

Hypoperfusion of the RIGHT lower lobe vascular bed is again noted
with chronic volume loss in the RIGHT lower lobe. Potential filling
defect at the segmental level in the RIGHT lower lobe, difficult to
assess due to variable contrast attenuation in this area.

Subsegmental emboli in the LEFT lower lobe.

Lingular and LEFT upper lobe segmental and subsegmental emboli.

Mediastinum/Nodes: Thoracic inlet structures are grossly
unremarkable. 1.6 cm subcarinal lymph node of uncertain
significance. No axillary lymphadenopathy. Mild fullness of hilar
nodal tissue unchanged.

Patulous esophagus with some debris.

Lungs/Pleura: RIGHT lower lobe volume loss and RIGHT pleural
effusion is unchanged compared to previous imaging. Larger airways
are patent.

Upper Abdomen: Incidental imaging of upper abdominal contents with
reflux of contrast into hepatic veins as before.

Musculoskeletal: No acute musculoskeletal process. No destructive
bone finding.

Review of the MIP images confirms the above findings.
IMPRESSION: 1. LEFT lower lobe emboli and LEFT upper lobe emboli segmental and
subsegmental level.
2. Poor opacification of RIGHT pulmonary vascular bed in the lower
lobe in the setting of chronic airspace disease and effusion.
(Similar to the prior study) difficult to exclude small embolus in
this location potentially with segmental or subsegmental emboli.
3. Persistent global cardiac enlargement and signs of cardiac
dysfunction with reflux of contrast into hepatic veins as on the
previous study. Consider correlation with echocardiography as
warranted.
4. Enlargement of subcarinal lymph nodes, potentially reactive.
Given persistent airspace process in the RIGHT lung base and nodal
enlargement would suggest pulmonology follow-up for further
assessment and consideration of at a minimum three-month follow-up
CT for further assessment.

Critical Value/emergent results were called by telephone at the time
of interpretation on 05/06/2021 at [DATE] to provider ANGI BILLIOT
, who verbally acknowledged these results.

## 2022-09-02 ENCOUNTER — Other Ambulatory Visit (HOSPITAL_COMMUNITY): Payer: Self-pay

## 2022-09-02 ENCOUNTER — Other Ambulatory Visit: Payer: Self-pay

## 2022-09-03 ENCOUNTER — Other Ambulatory Visit: Payer: Self-pay

## 2023-03-24 ENCOUNTER — Telehealth (HOSPITAL_COMMUNITY): Payer: Self-pay | Admitting: Licensed Clinical Social Worker

## 2023-03-24 ENCOUNTER — Other Ambulatory Visit (HOSPITAL_COMMUNITY): Payer: Self-pay

## 2023-03-24 NOTE — Telephone Encounter (Signed)
H&V Care Navigation CSW Progress Note  Clinical Social Worker received call from pt requesting assistance in figuring out how to get medication.  States that he has been out of most of his meds for a few days at least and has been taking his eliquis and entresto at a lower dose to make it last longer but has been out of those for a few weeks too.  CSW consulted with pharmacy team who see that pt has not picked up medications from [facility] since Sept 23.  Has not had active Entresto and Eliquis assistance since 2022.  As pt has not been seen since since July last year and we have not had labs since then would need to see patient before refilling medications.  CSW sent pt to scheduler to get appt.     SDOH Screenings   Food Insecurity: Food Insecurity Present (06/21/2022)  Housing: Low Risk  (05/08/2021)  Transportation Needs: No Transportation Needs (05/08/2021)  Depression (PHQ2-9): Low Risk  (06/17/2021)  Financial Resource Strain: High Risk (06/21/2022)  Tobacco Use: Low Risk  (06/21/2022)   Burna Sis, LCSW Clinical Social Worker [facility] Desk#: [phone] Cell#: [phone]

## 2023-03-31 ENCOUNTER — Telehealth (HOSPITAL_COMMUNITY): Payer: Self-pay | Admitting: Licensed Clinical Social Worker

## 2023-03-31 NOTE — Telephone Encounter (Signed)
CSW attempted to contact patient again about potentially moving to earlier appt as he is not on meds at this time- unable to reach or leave VM.  Burna Sis, LCSW Clinical Social Worker [facility] Desk#: [phone] Cell#: [phone]

## 2023-04-04 ENCOUNTER — Telehealth (HOSPITAL_COMMUNITY): Payer: Self-pay | Admitting: Licensed Clinical Social Worker

## 2023-04-04 NOTE — Telephone Encounter (Signed)
Attempted to call pt to check in regarding possible earlier appt to get pt back on medications.  No answer and unable to leave VM.   Mailed pt information about upcoming appt.  Will continue to follow and assist as needed  Burna Sis, LCSW Clinical Social Worker [facility] Desk#: [phone] Cell#: [phone]

## 2023-04-12 ENCOUNTER — Telehealth (HOSPITAL_COMMUNITY): Payer: Self-pay | Admitting: Licensed Clinical Social Worker

## 2023-04-12 NOTE — Telephone Encounter (Signed)
CSW called pt to remind of appt of Friday.  Pt confirmed he is aware of appt and has no current barriers to attending.  Burna Sis, LCSW Clinical Social Worker [facility] Desk#: [phone] Cell#: [phone]

## 2023-04-14 ENCOUNTER — Telehealth (HOSPITAL_COMMUNITY): Payer: Self-pay

## 2023-04-14 NOTE — Telephone Encounter (Signed)
Called to confirm/remind patient of their appointment at the Advanced Heart Failure Clinic on 04/15/23. However, patient number was not valid and was unable to leave a voice message.

## 2023-04-15 ENCOUNTER — Other Ambulatory Visit (HOSPITAL_COMMUNITY): Payer: Self-pay

## 2023-04-15 ENCOUNTER — Encounter (HOSPITAL_COMMUNITY): Payer: Self-pay

## 2023-04-15 ENCOUNTER — Telehealth (HOSPITAL_COMMUNITY): Payer: Self-pay

## 2023-04-15 ENCOUNTER — Telehealth (HOSPITAL_COMMUNITY): Payer: Self-pay | Admitting: Pharmacy Technician

## 2023-04-15 ENCOUNTER — Ambulatory Visit (HOSPITAL_COMMUNITY)
Admission: RE | Admit: 2023-04-15 | Discharge: 2023-04-15 | Disposition: A | Discharge status: Home / Self Care | Payer: Self-pay | Source: Ambulatory Visit | Attending: Critical Care Medicine | Admitting: Critical Care Medicine

## 2023-04-15 ENCOUNTER — Ambulatory Visit (HOSPITAL_BASED_OUTPATIENT_CLINIC_OR_DEPARTMENT_OTHER)
Admission: RE | Admit: 2023-04-15 | Discharge: 2023-04-15 | Disposition: A | Discharge status: Home / Self Care | Payer: Self-pay | Source: Ambulatory Visit | Attending: Family Medicine | Admitting: Family Medicine

## 2023-04-15 VITALS — BP 124/94 | HR 103 | Wt 204.6 lb

## 2023-04-15 DIAGNOSIS — I371 Nonrheumatic pulmonary valve insufficiency: Secondary | ICD-10-CM | POA: Insufficient documentation

## 2023-04-15 DIAGNOSIS — I428 Other cardiomyopathies: Secondary | ICD-10-CM | POA: Insufficient documentation

## 2023-04-15 DIAGNOSIS — I2510 Atherosclerotic heart disease of native coronary artery without angina pectoris: Secondary | ICD-10-CM | POA: Insufficient documentation

## 2023-04-15 DIAGNOSIS — N1832 Chronic kidney disease, stage 3b: Secondary | ICD-10-CM | POA: Insufficient documentation

## 2023-04-15 DIAGNOSIS — Z7984 Long term (current) use of oral hypoglycemic drugs: Secondary | ICD-10-CM | POA: Insufficient documentation

## 2023-04-15 DIAGNOSIS — Z597 Insufficient social insurance and welfare support: Secondary | ICD-10-CM | POA: Insufficient documentation

## 2023-04-15 DIAGNOSIS — I081 Rheumatic disorders of both mitral and tricuspid valves: Secondary | ICD-10-CM | POA: Insufficient documentation

## 2023-04-15 DIAGNOSIS — I5022 Chronic systolic (congestive) heart failure: Secondary | ICD-10-CM

## 2023-04-15 DIAGNOSIS — Z86711 Personal history of pulmonary embolism: Secondary | ICD-10-CM

## 2023-04-15 DIAGNOSIS — Z79899 Other long term (current) drug therapy: Secondary | ICD-10-CM | POA: Insufficient documentation

## 2023-04-15 DIAGNOSIS — Z139 Encounter for screening, unspecified: Secondary | ICD-10-CM

## 2023-04-15 DIAGNOSIS — I5082 Biventricular heart failure: Secondary | ICD-10-CM | POA: Insufficient documentation

## 2023-04-15 DIAGNOSIS — Z5986 Financial insecurity: Secondary | ICD-10-CM | POA: Insufficient documentation

## 2023-04-15 DIAGNOSIS — E1122 Type 2 diabetes mellitus with diabetic chronic kidney disease: Secondary | ICD-10-CM | POA: Insufficient documentation

## 2023-04-15 DIAGNOSIS — I10 Essential (primary) hypertension: Secondary | ICD-10-CM

## 2023-04-15 DIAGNOSIS — E1165 Type 2 diabetes mellitus with hyperglycemia: Secondary | ICD-10-CM

## 2023-04-15 DIAGNOSIS — I130 Hypertensive heart and chronic kidney disease with heart failure and stage 1 through stage 4 chronic kidney disease, or unspecified chronic kidney disease: Secondary | ICD-10-CM | POA: Insufficient documentation

## 2023-04-15 DIAGNOSIS — Z86718 Personal history of other venous thrombosis and embolism: Secondary | ICD-10-CM | POA: Insufficient documentation

## 2023-04-15 LAB — ECHOCARDIOGRAM COMPLETE
AR max vel: 2.4 cm2
AV Area VTI: 2.31 cm2
AV Area mean vel: 2.19 cm2
AV Mean grad: 2 mmHg
AV Peak grad: 3 mmHg
Ao pk vel: 0.87 m/s
Area-P 1/2: 6.07 cm2
Calc EF: 26.7 %
MV M vel: 5.12 m/s
MV Peak grad: 104.9 mmHg
MV VTI: 1.87 cm2
P 1/2 time: 667 msec
Radius: 0.6 cm
S' Lateral: 5.2 cm
Single Plane A2C EF: 27.9 %
Single Plane A4C EF: 26.1 %

## 2023-04-15 LAB — COMPREHENSIVE METABOLIC PANEL
ALT: 16 U/L (ref 0–44)
AST: 16 U/L (ref 15–41)
Albumin: 3.2 g/dL — ABNORMAL LOW (ref 3.5–5.0)
Alkaline Phosphatase: 121 U/L (ref 38–126)
Anion gap: 15 (ref 5–15)
BUN: 22 mg/dL (ref 8–23)
CO2: 26 mmol/L (ref 22–32)
Calcium: 8.6 mg/dL — ABNORMAL LOW (ref 8.9–10.3)
Chloride: 97 mmol/L — ABNORMAL LOW (ref 98–111)
Creatinine, Ser: 2.31 mg/dL — ABNORMAL HIGH (ref 0.61–1.24)
GFR, Estimated: 31 mL/min — ABNORMAL LOW (ref >60)
Glucose, Bld: 336 mg/dL — ABNORMAL HIGH (ref 70–99)
Potassium: 3.5 mmol/L (ref 3.5–5.1)
Sodium: 138 mmol/L (ref 135–145)
Total Bilirubin: 2.2 mg/dL — ABNORMAL HIGH (ref 0.3–1.2)
Total Protein: 6.5 g/dL (ref 6.5–8.1)

## 2023-04-15 LAB — LIPID PANEL
Cholesterol: 131 mg/dL (ref 0–200)
HDL: 30 mg/dL — ABNORMAL LOW (ref >40)
LDL Cholesterol: 79 mg/dL (ref 0–99)
Total CHOL/HDL Ratio: 4.4 RATIO
Triglycerides: 109 mg/dL (ref <150)
VLDL: 22 mg/dL (ref 0–40)

## 2023-04-15 LAB — CBC
HCT: 47.1 % (ref 39.0–52.0)
Hemoglobin: 15.3 g/dL (ref 13.0–17.0)
MCH: 28.9 pg (ref 26.0–34.0)
MCHC: 32.5 g/dL (ref 30.0–36.0)
MCV: 88.9 fL (ref 80.0–100.0)
Platelets: 183 10*3/uL (ref 150–400)
RBC: 5.3 MIL/uL (ref 4.22–5.81)
RDW: 13.9 % (ref 11.5–15.5)
WBC: 8.8 10*3/uL (ref 4.0–10.5)
nRBC: 0 % (ref 0.0–0.2)

## 2023-04-15 LAB — BRAIN NATRIURETIC PEPTIDE: B Natriuretic Peptide: 827.9 pg/mL — ABNORMAL HIGH (ref 0.0–100.0)

## 2023-04-15 MED ORDER — EMPAGLIFLOZIN 10 MG PO TABS
10.0000 mg | ORAL_TABLET | Freq: Every day | ORAL | 8 refills | Status: DC
  Filled 2023-04-15 – 2023-04-19 (×3): qty 30, 30d supply, fill #0

## 2023-04-15 MED ORDER — PERFLUTREN LIPID MICROSPHERE
1.0000 mL | INTRAVENOUS | Status: DC | PRN
  Administered 2023-04-15: 3 mL via INTRAVENOUS

## 2023-04-15 MED ORDER — FUROSEMIDE 40 MG PO TABS
40.0000 mg | ORAL_TABLET | ORAL | 8 refills | Status: DC | PRN
  Filled 2023-04-15: qty 30, 30d supply, fill #0

## 2023-04-15 MED ORDER — POTASSIUM CHLORIDE CRYS ER 20 MEQ PO TBCR: 40.0000 meq | EXTENDED_RELEASE_TABLET | Freq: Every day | ORAL | 5 refills | Status: DC

## 2023-04-15 MED ORDER — FUROSEMIDE 40 MG PO TABS: 40.0000 mg | ORAL_TABLET | Freq: Two times a day (BID) | ORAL | 3 refills | Status: DC

## 2023-04-15 MED ORDER — ENTRESTO 24-26 MG PO TABS
1.0000 | ORAL_TABLET | Freq: Two times a day (BID) | ORAL | 8 refills | Status: DC
  Filled 2023-04-15: qty 60, 30d supply, fill #0

## 2023-04-15 NOTE — Telephone Encounter (Signed)
Advanced Heart Failure Patient Advocate Encounter  Patient was seen in clinic today and is currently uninsured. Started [facility] application for [employer]. Will send in application once signatures are obtained.

## 2023-04-15 NOTE — Progress Notes (Signed)
Medication Samples have been provided to the patient.  Drug name: Jardiance       Strength: 10 mg        Qty: 28  LOT: [id number]  Exp.Date: January/2023  Dosing instructions: Take 1 tablet by mouth daily.  The patient has been instructed regarding the correct time, dose, and frequency of taking this medication, including desired effects and most common side effects.   Novella Rob Sircharles Holzheimer 1:35 PM 04/15/2023   Medication Samples have been provided to the patient.  Drug name: Sherryll Burger        Strength: 24-26 mg        Qty: 28  LOT: [id number]  Exp.Date: April/2026  Dosing instructions: Take 1 tablet by mouth twice daily  The patient has been instructed regarding the correct time, dose, and frequency of taking this medication, including desired effects and most common side effects.   Novella Rob Oma Alpert 1:35 PM 04/15/2023

## 2023-04-15 NOTE — Progress Notes (Signed)
ReDS Vest / Clip - 04/15/23 1100       ReDS Vest / Clip   Station Marker C    Ruler Value 31    ReDS Value Range Moderate volume overload    ReDS Actual Value 40

## 2023-04-15 NOTE — Telephone Encounter (Addendum)
Spoke to patient's son Alecia Lemming about medication changes.Patient to call office back about scheduling  lab work   ----- Message from Jacklynn Ganong, [place] sent at 04/15/2023  1:44 PM EDT ----- Kidney function declining, blood glucose very elevated, BNP elevated suggesting volume overloaded. TBilil elevated.   Do NOT start Entresto as discussed at visit. OK to start jardiance. Start Lasix 40 mg bid + 40 KCL daily. He needs BMET in 7-10 days please.

## 2023-04-15 NOTE — Patient Instructions (Addendum)
Thank you for coming in today  Labs were done today, if any labs are abnormal the clinic will call you No news is good news  Medications: RESTART Entresto 24/26 mg 1 tablet twice daily RESTART Jardiance 10 mg 1 tablet daily  Lasix 40 mg daily as needed  For weight gain of 3 lbs in 24 hours or 5 lbs in a week    Follow up appointments:  Your physician recommends that you schedule a follow-up appointment in:  3 weeks in pharmacy 6 weeks in clinic 3 months With Dr. Gala Romney      Do the following things EVERYDAY: Weigh yourself in the morning before breakfast. Write it down and keep it in a log. Take your medicines as prescribed Eat low salt foods--Limit salt (sodium) to 2000 mg per day.  Stay as active as you can everyday Limit all fluids for the day to less than 2 liters   At the [facility], you and your health needs are our priority. As part of our continuing mission to provide you with exceptional heart care, we have created designated Provider Care Teams. These Care Teams include your primary Cardiologist (physician) and Advanced Practice Providers (APPs- Physician Assistants and Nurse Practitioners) who all work together to provide you with the care you need, when you need it.   You may see any of the following providers on your designated Care Team at your next follow up: Dr Arvilla Meres Dr Marca Ancona Dr. Marcos Eke, NP Robbie Lis, [place] [facility] [place], [place] Brynda Peon, NP Karle Plumber, PharmD   Please be sure to bring in all your medications bottles to every appointment.    Thank you for choosing [facility]  If you have any questions or concerns before your next appointment please send [place] a message through [place] or call our office at [phone].    TO LEAVE A MESSAGE FOR THE NURSE SELECT OPTION 2, PLEASE LEAVE A MESSAGE INCLUDING: YOUR NAME DATE OF  BIRTH CALL BACK NUMBER REASON FOR CALL**this is important as we prioritize the call backs  YOU WILL RECEIVE A CALL BACK THE SAME DAY AS LONG AS YOU CALL BEFORE 4:00 PM

## 2023-04-15 NOTE — Telephone Encounter (Signed)
Advanced Heart Failure Patient Advocate Encounter  Patient was seen in clinic today and is currently uninsured. Started Therapist, nutritional for [employer]. Will send in application once signatures are obtained. Patient has been reminded that POI is required in order to obtain assistance.

## 2023-04-15 NOTE — Addendum Note (Signed)
Encounter addended by: Faythe Casa, CMA on: 04/15/2023 1:39 PM  Actions taken: Clinical Note Signed

## 2023-04-15 NOTE — Progress Notes (Signed)
Advanced Heart Failure Clinic Note    PCP: Storm Frisk, MD HF Cardiologist: Dr. Gala Romney   Reason for Visit: f/u for biventricular heart failure   HPI: [name] is a 62 y.o. male with a h/o HTN, CKD 3b (Scr 1.8), DVT, and systolic CHF.    Found to have DVT in 2018. He then developed fluid overload and had echo at OSH in 2019 and was told heart was operating at 20%. Underwent diuresis and felt better.  Did not have cath or repeat u/s. Says he got scared and decided not to f/u.    Seen 8/20 in HF Clinic for one visit. Bedside echo showed EF 20% and RV moderately HK. Reported only NYHA II symptoms, but was markedly tachycardic. Advised on need for R/L cath but he never followed up. DVT felt to be likely provoked in setting of immobility when he first had HF.  Admitted 5/22 with a/c systolic heart failure and acute RLL segmental PE and large rt pleural effusion requiring thoracentesis. LE doppler negative for PE. Repeat echo showed EF 10-15% w/ RV dilated  and severely HK. R/LHC 5/27 with minimal CAD and severe NICM, mildly elevated filling pressures and relatively preserved CO, 5.4, CI 2.5. cMRI was recommended but unable to get completed inpatient. D/c wt 211 lb.   Echo 10/22 EF ~25% w/ global HK. RV mildly enlarged and mildly reduced. Moderate MR noted w/ restricted posterior leaflet motion. Mild TR.   Follow up 3/23, stable NYHA I, volume OK. Dig stopped and Coreg increased to 6.25 bid. Follow up 7/23, doing well NYHA I, volume stable.  Today he returns for HF follow up. We have not seen him since 7/23.  Overall feeling fine. 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 He has SOB push mowing yard. Legs are tight. Denies palpitations, CP, dizziness, or PND/Orthopnea. Appetite not great, has daily nausea and is constipated. No fever or chills. Weight at home 200-205 pounds. Off all GDMT x 2 months. No tobacco/drugs, rare ETOH.  Echo 04/15/23 showed EF 10-15%, mild  LVH, RV moderately down, RVSP 69.2 mmHg, mild to moderate MR  Cardiac Studies - Echo (5/24): EF 10-15%, mild LVH, RV moderately down, RVSP 69.2 mmHg, mild to moderate MR  - Echo (10/22): EF 25%, RV mildly reduced, Mod MR w/ restricted posterior leaflet motion.    - Echo (5/22): EF 10-15%, RV severely reduced, Mod MR, Mod-severe TR  - R/LHC (5/22) RPDA lesion is 50% stenosed. 1st Diag lesion is 20% stenosed. Dist LAD lesion is 20% stenosed.   Ao = 108/71 (88) LV = 112/22 RA =  10 RV = 59/14 PA = 57/22 (37) PCW = 14 Fick cardiac output/index = 5.4/2.5 PVR = 4.3 WU SVR = 1166 Ao sat = 99% PA sat = 71%, 72%   1. Minimal CAD 2. Severe NICM  3. Mild PAH 4. Mildly elevated filling pressure with relatively preserved CO   ROS: All systems reviewed and negative except as per HPI.   Past Medical History:  Diagnosis Date   CHF (congestive heart failure) (HCC)    Chronic congestive heart failure (HCC) 08/27/2019   Renal disorder    kidney stones   Current Outpatient Medications  Medication Sig Dispense Refill   blood glucose meter kit and supplies KIT Dispense based on patient and insurance preference. Use up to four times daily as directed. (Patient not taking: Reported on 04/15/2023) 1 each 0   carvedilol (COREG) 6.25 MG tablet Take 0.5  tablets (3.125 mg total) by mouth 2 (two) times daily. (Patient not taking: Reported on 04/15/2023) 60 tablet 6   No current facility-administered medications for this encounter.   Facility-Administered Medications Ordered in Other Encounters  Medication Dose Route Frequency Provider Last Rate Last Admin   perflutren lipid microspheres (DEFINITY) IV suspension  1-10 mL Intravenous PRN Jacklynn Ganong, FNP   3 mL at 04/15/23 1103   No Known Allergies  Social History   Socioeconomic History   Marital status: Single    Spouse name: Not on file   Number of children: Not on file   Years of education: Not on file   Highest education level: Not  on file  Occupational History   Not on file  Tobacco Use   Smoking status: Never   Smokeless tobacco: Never  Substance and Sexual Activity   Alcohol use: Not on file   Drug use: Not on file   Sexual activity: Not on file  Other Topics Concern   Not on file  Social History Narrative   Not on file   Social Determinants of Health   Financial Resource Strain: High Risk (06/21/2022)   Overall Financial Resource Strain (CARDIA)    Difficulty of Paying Living Expenses: Hard  Food Insecurity: Food Insecurity Present (06/21/2022)   Hunger Vital Sign    Worried About Running Out of Food in the Last Year: Sometimes true    Ran Out of Food in the Last Year: Sometimes true  Transportation Needs: No Transportation Needs (05/08/2021)   PRAPARE - Administrator, Civil Service (Medical): No    Lack of Transportation (Non-Medical): No  Physical Activity: Not on file  Stress: Not on file  Social Connections: Not on file  Intimate Partner Violence: Not on file   No family history on file.  BP (!) 124/94   Pulse (!) 103   Wt 92.8 kg (204 lb 9.6 oz)   SpO2 97%   BMI 29.36 kg/m   Wt Readings from Last 3 Encounters:  04/15/23 92.8 kg (204 lb 9.6 oz)  06/21/22 92.5 kg (204 lb)  02/18/22 96.7 kg (213 lb 4 oz)   PHYSICAL EXAM: General:  NAD. No resp difficulty, walked into clinic HEENT: Normal Neck: Supple. JVP 10-12. Carotids 2+ bilat; no bruits. No lymphadenopathy or thryomegaly appreciated. Cor: PMI nondisplaced. Tachy rate & rhythm. No rubs, gallops, 2/6 MR Lungs: Clear Abdomen: Soft, nontender, nondistended. No hepatosplenomegaly. No bruits or masses. Good bowel sounds. Extremities: No cyanosis, clubbing, rash, 1+ BLE edema, warm extremities Neuro: Alert & oriented x 3, cranial nerves grossly intact. Moves all 4 extremities w/o difficulty. Affect pleasant.  ECG (personally reviewed): ST with PVC  ReDs: 40%  ASSESSMENT & PLAN: 1. Chronic Biventricular Heart Failure  -  Suspect HTN CM.  - Echo (5/22): EF 15% RV severely HK - cath (5/22): with minimal CAD. Severe NICM. Hemodynamics much better than expected. Fick cardiac output/index = 5.4/2.5 - has not had cMRI. Uninsured.  - Echo (10/22): EF ~25%, RV mildly reduced.  - Echo 04/15/23 showed EF 10-15%, mild LVH, RV moderately down, RSVP 69.2 mmHg, mild to moderate MR - NYHA II-early III. Volume status up, ReDs 40%. Off all GDMT - Place compression hose. - Restart Entresto 24/26 mg bid. Patient assistance started, may need to switch to losartan if he is unable to provide POI - Restart Jardiance 10 mg daily. Patient assistance started. - Give Rx for Lasix 40 mg PRN (HF fund). - Plan  to add back spiro, then beta blocker. - Eventually repeat echo when back on GDMT. - May be a candidate for advanced therapies if he can demonstrate compliance. Consider CPX and RHC down the road. - Labs today. Repeat BMET at follow up.   2. Hypertension  - BP OK today. - Restart GDMT as above   3. H/o RLL Subsegmental PE - Diagnosed 5/22, LE Dopplers negative for DVT  - Now off Eliquis.   4. 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 - Consider referral to Nephrology.  5. CAD - mild by cath (5/22). - No chest pain - Consider starting stain + ASA next visit. - Plan to restart beta blocker down the road - Check lipids  6. Type 2 DM - Hgb A1c 11.6 -> 7.4. - Per PCP - Check A1C today.  7. SDOH - uninsured. Engage HFSW - Meds thru HF fund. - He has transportation, cell phone, and a scale. - He needs PCP follow up, I asked him to call and arrange this.  Follow up in 3 weeks with PharmD (add spiro, discuss adding asa/statin), 6 weeks with APP (add beta blocker),  and 3 months with Dr. Gala Romney.  [name] Malta Villisca, FNP 04/15/23

## 2023-04-18 LAB — HEMOGLOBIN A1C
Hgb A1c MFr Bld: >15.5 % — ABNORMAL HIGH (ref 4.8–5.6)
Mean Plasma Glucose: >398 mg/dL

## 2023-04-19 ENCOUNTER — Other Ambulatory Visit (HOSPITAL_COMMUNITY): Payer: Self-pay

## 2023-04-19 ENCOUNTER — Other Ambulatory Visit: Payer: Self-pay

## 2023-04-19 ENCOUNTER — Telehealth (HOSPITAL_COMMUNITY): Payer: Self-pay

## 2023-04-19 MED ORDER — POTASSIUM CHLORIDE CRYS ER 20 MEQ PO TBCR: 40.0000 meq | EXTENDED_RELEASE_TABLET | Freq: Every day | ORAL | 5 refills | Status: DC

## 2023-04-19 MED ORDER — FUROSEMIDE 40 MG PO TABS: 40.0000 mg | ORAL_TABLET | Freq: Two times a day (BID) | ORAL | 3 refills | Status: DC

## 2023-04-19 NOTE — Telephone Encounter (Signed)
Advanced Heart Failure Patient Advocate Encounter  Application sent in fri 05/03. Document scanned to chart.   

## 2023-04-19 NOTE — Telephone Encounter (Signed)
Advanced Heart Failure Patient Advocate Encounter  Patient needs POI in order for [employer] to continue processing his application. Attempted to call patient, no way to leave message. Called and spoke with patient's son, who will bring in documentation.

## 2023-04-19 NOTE — Telephone Encounter (Signed)
Advanced Heart Failure Patient Advocate Encounter   Patient was approved to receive Jardiance from [employer]  Effective dates: 04/19/23 through 04/18/24  Patient will receive first shipment in 5-7 business days. Attempted to call patient - no answer, no way to leave message.  Archer Asa, CPhT

## 2023-04-19 NOTE — Telephone Encounter (Signed)
Advanced Heart Failure Patient Advocate Encounter  Application sent in fri 05/03. Document scanned to chart.

## 2023-04-19 NOTE — Telephone Encounter (Signed)
I spoke to son and went over labs with him. He will contact PCP to discuss elevated A1c. Refills of Lasix and Potassium sent into pharmacy. He will get with Thurmond Butts and set up repeat lab appointment.

## 2023-04-21 ENCOUNTER — Telehealth (HOSPITAL_COMMUNITY): Payer: Self-pay | Admitting: Licensed Clinical Social Worker

## 2023-04-21 NOTE — Telephone Encounter (Signed)
H&V Care Navigation CSW Progress Note  Clinical Social Worker attempted to call pt to assess for insurance coverage options.  Unable to reach or leave a VM at this time- will continue to attempt- will mail out medicaid flyer as well.   SDOH Screenings   Food Insecurity: Food Insecurity Present (06/21/2022)  Housing: Low Risk  (05/08/2021)  Transportation Needs: No Transportation Needs (05/08/2021)  Depression (PHQ2-9): Low Risk  (06/17/2021)  Financial Resource Strain: High Risk (06/21/2022)  Tobacco Use: Low Risk  (04/15/2023)    Burna Sis, LCSW Clinical Social Worker [facility] Desk#: [phone] Cell#: [phone]

## 2023-05-03 NOTE — Telephone Encounter (Signed)
Advanced Heart Failure Patient Advocate Encounter  POI sent to [employer] via fax.

## 2023-05-05 ENCOUNTER — Telehealth (HOSPITAL_COMMUNITY): Payer: Self-pay | Admitting: Licensed Clinical Social Worker

## 2023-05-05 ENCOUNTER — Inpatient Hospital Stay (HOSPITAL_COMMUNITY): Admission: RE | Admit: 2023-05-05 | Payer: Self-pay | Source: Ambulatory Visit

## 2023-05-05 ENCOUNTER — Ambulatory Visit: Payer: Self-pay | Admitting: *Deleted

## 2023-05-05 NOTE — Telephone Encounter (Signed)
  Chief Complaint: A1c >15.5 on 04/15/23 and unable to register today per [place] Child psychotherapist for Advanced Heart failure clinic.  Symptoms: patient denies any sx Frequency: na Pertinent Negatives: Patient denies any sx  Disposition: [] ED /[] Urgent Care (no appt availability in office) / [] Appointment(In office/virtual)/ []  [facility]/ [] Home Care/ [] Refused Recommended Disposition /[] [facility]/ [x]  Follow-up with PCP Additional Notes:   Recommended to get glucose meter to check glucose as needed. Recommended to go to ED if any sx noted. No available  appt until June 12. Patient reports he is in [place] today and tomorrow working. Please advise.    Reason for Disposition  [1] Blood glucose > 300 mg/dL (04.5 mmol/L) AND [4] two or more times in a row  Answer Assessment - Initial Assessment Questions 1. BLOOD GLUCOSE: "What is your blood glucose level?"      A1c > 15.5 on 04/15/23 and per Jeanna from Heart failure clinic A1c unable to register 2. ONSET: "When did you check the blood glucose?"     Patient does not check glucose did not get Rx for montior kit 3. USUAL RANGE: "What is your glucose level usually?" (e.g., usual fasting morning value, usual evening value)     na 4. KETONES: "Do you check for ketones (urine or blood test strips)?" If Yes, ask: "What does the test show now?"      Na  5. TYPE 1 or 2:  "Do you know what type of diabetes you have?"  (e.g., Type 1, Type 2, Gestational; doesn't know)      Na  6. INSULIN: "Do you take insulin?" "What type of insulin(s) do you use? What is the mode of delivery? (syringe, pen; injection or pump)?"      Na  7. DIABETES PILLS: "Do you take any pills for your diabetes?" If Yes, ask: "Have you missed taking any pills recently?"     Jardiance and has not missed dose per patient 8. OTHER SYMPTOMS: "Do you have any symptoms?" (e.g., fever, frequent urination, difficulty breathing, dizziness, weakness, vomiting)     Denies   9. PREGNANCY: "Is there any chance you are pregnant?" "When was your last menstrual period?"     Na  Protocols used: Diabetes - High Blood Sugar-A-AH

## 2023-05-05 NOTE — Telephone Encounter (Signed)
H&V Care Navigation CSW Progress Note  Clinical Social Worker received call from pt requesting help rescheduling pharmacy visit for today.  CSW spoke with pharmacist who updated CSW regarding his many medical concerns including A1C that was so high it was unreadable in labs.  He had been instructed to call PCP regarding A1C but has not done so.  CSW assisted pt in calling PCP office and informing them of his lab results.  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  CHW RN will speak with their staff and work on getting pt in.  CSW also working with clinic staff to get him seen in clinic.  Pt won't be able to come to pharmacy clinic reliably because he is normally off on Fridays.  Trying to get him sooner appt than currently scheduled one on June 14th due to concerns.  Also discussed applying for Medicaid- sounds like he might qualify give income level.   SDOH Screenings   Food Insecurity: Food Insecurity Present (06/21/2022)  Housing: Low Risk  (05/08/2021)  Transportation Needs: No Transportation Needs (05/08/2021)  Depression (PHQ2-9): Low Risk  (06/17/2021)  Financial Resource Strain: High Risk (06/21/2022)  Tobacco Use: Low Risk  (04/15/2023)   Burna Sis, LCSW Clinical Social Worker [facility] Desk#: [phone] Cell#: [phone]

## 2023-05-05 NOTE — Telephone Encounter (Signed)
Call placed to patient unable to reach. Unable to leave message.

## 2023-05-10 NOTE — Telephone Encounter (Signed)
Advanced Heart Failure Patient Advocate Encounter  Called [employer] to check the status of the patient's application. Representative stated that the patient didn't provide enough POI for an approval. They called and spoke with the patient after receiving the initial POI (05/21). He will need to supply the last 3 months of check stubs or a w2.

## 2023-05-16 ENCOUNTER — Telehealth (HOSPITAL_COMMUNITY): Payer: Self-pay | Admitting: Licensed Clinical Social Worker

## 2023-05-16 NOTE — Telephone Encounter (Signed)
H&V Care Navigation CSW Progress Note  Clinical Social Worker attempted to call pt to check in and attempt to continue efforts to bring in for follow up given concerning labs.  Unable to reach- unable to leave message   SDOH Screenings   Food Insecurity: Food Insecurity Present (06/21/2022)  Housing: Low Risk  (05/08/2021)  Transportation Needs: No Transportation Needs (05/08/2021)  Depression (PHQ2-9): Low Risk  (06/17/2021)  Financial Resource Strain: High Risk (06/21/2022)  Tobacco Use: Low Risk  (04/15/2023)    Burna Sis, LCSW Clinical Social Worker [facility] Desk#: [phone] Cell#: [phone]

## 2023-05-25 NOTE — Progress Notes (Addendum)
[facility] Clinic Note    PCP: Storm Frisk, MD HF Cardiologist: Dr. Gala Romney   Reason for Visit: f/u for biventricular heart failure   HPI: [place] is a 62 y.o. male with a h/o HTN, CKD 3b (Scr 1.8), DVT, and systolic CHF.    Found to have DVT in 2018. He then developed fluid overload and had echo at OSH in 2019 and was told heart was operating at 20%. Underwent diuresis and felt better.  Did not have cath or repeat u/s. Says he got scared and decided not to f/u.    Seen 8/20 in HF Clinic for one visit. Bedside echo showed EF 20% and RV moderately HK. Reported only NYHA II symptoms, but was markedly tachycardic. Advised on need for R/L cath but he never followed up. DVT felt to be likely provoked in setting of immobility when he first had HF.  Admitted 5/22 with a/c systolic heart failure and acute RLL segmental PE and large rt pleural effusion requiring thoracentesis. LE doppler negative for PE. Repeat echo showed EF 10-15% w/ RV dilated  and severely HK. R/LHC 5/27 with minimal CAD and severe NICM, mildly elevated filling pressures and relatively preserved CO, 5.4, CI 2.5. cMRI was recommended but unable to get completed inpatient. D/c wt 211 lb.   Echo 10/22 EF ~25% w/ global HK. RV mildly enlarged and mildly reduced. Moderate MR noted w/ restricted posterior leaflet motion. Mild TR.   Follow up 3/23, stable NYHA I, volume OK. Dig stopped and Coreg increased to 6.25 bid. Follow up 7/23, doing well NYHA I, volume stable.  Unfortunately he was lost to follow up until 5/24. He had been off all GDMT. Volume up and had NYHA II-early III symptoms. Attempted to restart Entresto but labs showed SCr 1.66>>2.31, instructed to remain off Entresto and increase Lasix to 40 bid.  Echo 04/15/23 showed EF 10-15%, mild LVH, RV moderately down, RVSP 69.2 mmHg, mild to moderate MR  Today he returns for HF follow up. Overall feeling OK. He has SOB walking up his driveway, has SOB at  work and has to stop and take rest breaks Engineer, mining in [place]). Does OK walking on flat ground. Denies CP, dizziness, edema, palpitations, or PND/Orthopnea. Appetite ok. No fever or chills. Weight at home 191 pounds. Has not seen PCP in awhile, recent A1C >15.5. He has tingling in legs. No tobacco/drugs, occasional ETOH.   Cardiac Studies - Echo (5/24): EF 10-15%, mild LVH, RV moderately down, RVSP 69.2 mmHg, mild to moderate MR  - Echo (10/22): EF 25%, RV mildly reduced, Mod MR w/ restricted posterior leaflet motion.    - Echo (5/22): EF 10-15%, RV severely reduced, Mod MR, Mod-severe TR  - R/LHC (5/22) RPDA lesion is 50% stenosed. 1st Diag lesion is 20% stenosed. Dist LAD lesion is 20% stenosed.   Ao = 108/71 (88) LV = 112/22 RA =  10 RV = 59/14 PA = 57/22 (37) PCW = 14 Fick cardiac output/index = 5.4/2.5 PVR = 4.3 WU SVR = 1166 Ao sat = 99% PA sat = 71%, 72%   1. Minimal CAD 2. Severe NICM  3. Mild PAH 4. Mildly elevated filling pressure with relatively preserved CO   ROS: All systems reviewed and negative except as per HPI.   Past Medical History:  Diagnosis Date   CHF (congestive heart failure) (HCC)    Chronic congestive heart failure (HCC) 08/27/2019   Renal disorder    kidney stones  Current Outpatient Medications  Medication Sig Dispense Refill   empagliflozin (JARDIANCE) 10 MG TABS tablet Take 1 tablet (10 mg total) by mouth daily. 30 tablet 8   furosemide (LASIX) 40 MG tablet Take 1 tablet (40 mg total) by mouth 2 (two) times daily. 90 tablet 3   blood glucose meter kit and supplies KIT Dispense based on patient and insurance preference. Use up to four times daily as directed. (Patient not taking: Reported on 04/15/2023) 1 each 0   carvedilol (COREG) 6.25 MG tablet Take 0.5 tablets (3.125 mg total) by mouth 2 (two) times daily. (Patient not taking: Reported on 05/27/2023) 60 tablet 6   potassium chloride SA (KLOR-CON M) 20 MEQ tablet Take 2 tablets  (40 mEq total) by mouth daily. (Patient not taking: Reported on 05/27/2023) 60 tablet 5   sacubitril-valsartan (ENTRESTO) 24-26 MG Take 1 tablet by mouth 2 (two) times daily. (Patient not taking: Reported on 05/27/2023) 60 tablet 8   No current facility-administered medications for this encounter.   No Known Allergies  Social History   Socioeconomic History   Marital status: Single    Spouse name: Not on file   Number of children: Not on file   Years of education: Not on file   Highest education level: Not on file  Occupational History   Not on file  Tobacco Use   Smoking status: Never   Smokeless tobacco: Never  Substance and Sexual Activity   Alcohol use: Not on file   Drug use: Not on file   Sexual activity: Not on file  Other Topics Concern   Not on file  Social History Narrative   Not on file   Social Determinants of Health   Financial Resource Strain: High Risk (06/21/2022)   Overall Financial Resource Strain (CARDIA)    Difficulty of Paying Living Expenses: Hard  Food Insecurity: Food Insecurity Present (06/21/2022)   Hunger Vital Sign    Worried About Running Out of Food in the Last Year: Sometimes true    Ran Out of Food in the Last Year: Sometimes true  Transportation Needs: No Transportation Needs (05/08/2021)   PRAPARE - Administrator, Civil Service (Medical): No    Lack of Transportation (Non-Medical): No  Physical Activity: Not on file  Stress: Not on file  Social Connections: Not on file  Intimate Partner Violence: Not on file   No family history on file.  BP 124/76   Pulse (!) 106   Ht 5\' 10"  (1.778 m)   Wt 86.6 kg (191 lb)   SpO2 97%   BMI 27.41 kg/m   Wt Readings from Last 3 Encounters:  05/27/23 86.6 kg (191 lb)  04/15/23 92.8 kg (204 lb 9.6 oz)  06/21/22 92.5 kg (204 lb)   PHYSICAL EXAM: General:  NAD. No resp difficulty, walked into clinic HEENT: Normal Neck: Supple. JVP 8. Carotids 2+ bilat; no bruits. No lymphadenopathy or  thryomegaly appreciated. Cor: PMI nondisplaced. Regular rate & rhythm. No rubs, gallops, 2/6 MR Lungs: Clear Abdomen: Soft, nontender, nondistended. No hepatosplenomegaly. No bruits or masses. Good bowel sounds. Extremities: No cyanosis, clubbing, rash, edema Neuro: Alert & oriented x 3, cranial nerves grossly intact. Moves all 4 extremities w/o difficulty. Affect pleasant.  ReDs: 40%  ASSESSMENT & PLAN: 1. Chronic Biventricular Heart Failure  - Suspect HTN CM.  - Echo (5/22): EF 15% RV severely HK - cath (5/22): with minimal CAD. Severe NICM. Hemodynamics much better than expected. Fick cardiac output/index = 5.4/2.5 -  has not had cMRI. Uninsured.  - Echo (10/22): EF ~25%, RV mildly reduced.  - Echo (04/15/23): EF 10-15%, mild LVH, RV moderately down, RSVP 69.2 mmHg, mild to moderate MR - NYHA II-early III. Volume status better today, weight down 13 lbs, although REDs unchanged at 40%. GDMT limited by worsening CKD - Attempt to re-trial Entresto 24/26 mg bid. - Continue Lasix 40 mg daily, change KCL to 10 meQ tablets. - Restart Coreg 3.125 mg bid - Continue Jardiance 10 mg daily. Patient assistance started. - Hold off on MRA with renal function. - May be a candidate for advanced therapies if he can demonstrate compliance and obtain insurance  - Consider CPX and RHC down the road after back on GDMT. - Labs today. Repeat BMET in 7-10 days. - Repeat echo when back on GDMT   2. Hypertension  - BP ok - Restart GDMT as above   3. H/o RLL Subsegmental PE - Diagnosed 5/22, LE Dopplers negative for DVT  - Now off Eliquis.   4. 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  - Continue SGLT2i - Follow BMET closely with addition of Entresto - Labs today. - Consider referral to Nephrology.  5. CAD - mild by cath (5/22).  - LDL 76 (5/24) - No chest pain - Consider starting stain + ASA next visit. - Restart beta blocker today.  6. Type 2 DM,  uncontrolled. - Hgb A1c >15.5. (5/24). - Long discussion about consequences on uncontrolled DM2, he is adamant he cannot injection insulin. - Needs follow up with PCP asap, given # to Dr. Lynelle Doctor office today and asked to call for an appt. - Continue SGLT2i for now, no GU symptoms.  7. SDOH - uninsured. Engage HFSW - Meds thru HF fund. - He has transportation, cell phone, and a scale. - He needs PCP follow up, I asked him to call and arrange this.  Follow up in 2 months with Dr. Gala Romney + echo.  [place], FNP 05/27/23

## 2023-05-27 ENCOUNTER — Encounter (HOSPITAL_COMMUNITY): Payer: Self-pay

## 2023-05-27 ENCOUNTER — Other Ambulatory Visit (HOSPITAL_COMMUNITY): Payer: Self-pay

## 2023-05-27 ENCOUNTER — Ambulatory Visit (HOSPITAL_COMMUNITY)
Admission: RE | Admit: 2023-05-27 | Discharge: 2023-05-27 | Disposition: A | Discharge status: Home / Self Care | Payer: Self-pay | Source: Ambulatory Visit | Attending: Family Medicine | Admitting: Family Medicine

## 2023-05-27 VITALS — BP 124/76 | HR 106 | Ht 70.0 in | Wt 191.0 lb

## 2023-05-27 DIAGNOSIS — Z597 Insufficient social insurance and welfare support: Secondary | ICD-10-CM | POA: Insufficient documentation

## 2023-05-27 DIAGNOSIS — I5022 Chronic systolic (congestive) heart failure: Secondary | ICD-10-CM

## 2023-05-27 DIAGNOSIS — E1122 Type 2 diabetes mellitus with diabetic chronic kidney disease: Secondary | ICD-10-CM | POA: Insufficient documentation

## 2023-05-27 DIAGNOSIS — R202 Paresthesia of skin: Secondary | ICD-10-CM | POA: Insufficient documentation

## 2023-05-27 DIAGNOSIS — I130 Hypertensive heart and chronic kidney disease with heart failure and stage 1 through stage 4 chronic kidney disease, or unspecified chronic kidney disease: Secondary | ICD-10-CM | POA: Insufficient documentation

## 2023-05-27 DIAGNOSIS — Z86718 Personal history of other venous thrombosis and embolism: Secondary | ICD-10-CM | POA: Insufficient documentation

## 2023-05-27 DIAGNOSIS — I2510 Atherosclerotic heart disease of native coronary artery without angina pectoris: Secondary | ICD-10-CM | POA: Insufficient documentation

## 2023-05-27 DIAGNOSIS — Z139 Encounter for screening, unspecified: Secondary | ICD-10-CM

## 2023-05-27 DIAGNOSIS — Z79899 Other long term (current) drug therapy: Secondary | ICD-10-CM | POA: Insufficient documentation

## 2023-05-27 DIAGNOSIS — I10 Essential (primary) hypertension: Secondary | ICD-10-CM

## 2023-05-27 DIAGNOSIS — E1165 Type 2 diabetes mellitus with hyperglycemia: Secondary | ICD-10-CM

## 2023-05-27 DIAGNOSIS — N1832 Chronic kidney disease, stage 3b: Secondary | ICD-10-CM | POA: Insufficient documentation

## 2023-05-27 DIAGNOSIS — I081 Rheumatic disorders of both mitral and tricuspid valves: Secondary | ICD-10-CM | POA: Insufficient documentation

## 2023-05-27 DIAGNOSIS — Z86711 Personal history of pulmonary embolism: Secondary | ICD-10-CM | POA: Insufficient documentation

## 2023-05-27 DIAGNOSIS — I5082 Biventricular heart failure: Secondary | ICD-10-CM | POA: Insufficient documentation

## 2023-05-27 DIAGNOSIS — I428 Other cardiomyopathies: Secondary | ICD-10-CM | POA: Insufficient documentation

## 2023-05-27 DIAGNOSIS — R0602 Shortness of breath: Secondary | ICD-10-CM | POA: Insufficient documentation

## 2023-05-27 DIAGNOSIS — Z7984 Long term (current) use of oral hypoglycemic drugs: Secondary | ICD-10-CM | POA: Insufficient documentation

## 2023-05-27 LAB — IRON AND TIBC
Iron: 31 ug/dL — ABNORMAL LOW (ref 45–182)
Saturation Ratios: 6 % — ABNORMAL LOW (ref 17.9–39.5)
TIBC: 514 ug/dL — ABNORMAL HIGH (ref 250–450)
UIBC: 483 ug/dL

## 2023-05-27 LAB — CBC
HCT: 46.2 % (ref 39.0–52.0)
Hemoglobin: 14.2 g/dL (ref 13.0–17.0)
MCH: 26.3 pg (ref 26.0–34.0)
MCHC: 30.7 g/dL (ref 30.0–36.0)
MCV: 85.7 fL (ref 80.0–100.0)
Platelets: 148 10*3/uL — ABNORMAL LOW (ref 150–400)
RBC: 5.39 MIL/uL (ref 4.22–5.81)
RDW: 15.2 % (ref 11.5–15.5)
WBC: 8.8 10*3/uL (ref 4.0–10.5)
nRBC: 0 % (ref 0.0–0.2)

## 2023-05-27 LAB — BASIC METABOLIC PANEL WITH GFR
Anion gap: 11 (ref 5–15)
BUN: 39 mg/dL — ABNORMAL HIGH (ref 8–23)
CO2: 27 mmol/L (ref 22–32)
Calcium: 9.3 mg/dL (ref 8.9–10.3)
Chloride: 98 mmol/L (ref 98–111)
Creatinine, Ser: 1.9 mg/dL — ABNORMAL HIGH (ref 0.61–1.24)
GFR, Estimated: 40 mL/min — ABNORMAL LOW
Glucose, Bld: 286 mg/dL — ABNORMAL HIGH (ref 70–99)
Potassium: 3.7 mmol/L (ref 3.5–5.1)
Sodium: 136 mmol/L (ref 135–145)

## 2023-05-27 LAB — BRAIN NATRIURETIC PEPTIDE: B Natriuretic Peptide: 695.1 pg/mL — ABNORMAL HIGH (ref 0.0–100.0)

## 2023-05-27 LAB — FERRITIN: Ferritin: 34 ng/mL (ref 24–336)

## 2023-05-27 MED ORDER — CARVEDILOL 3.125 MG PO TABS
3.1250 mg | ORAL_TABLET | Freq: Two times a day (BID) | ORAL | 3 refills | Status: DC
  Filled 2023-05-27: qty 180, 90d supply, fill #0

## 2023-05-27 MED ORDER — CARVEDILOL 3.125 MG PO TABS
3.1250 mg | ORAL_TABLET | Freq: Two times a day (BID) | ORAL | 3 refills | Status: DC
  Filled 2023-05-27: qty 180, 90d supply, fill #0
  Filled 2023-08-05: qty 60, 30d supply, fill #1
  Filled 2023-10-07: qty 60, 30d supply, fill #2
  Filled 2023-11-30: qty 60, 30d supply, fill #3
  Filled 2024-01-12: qty 60, 30d supply, fill #4
  Filled 2024-03-14: qty 60, 30d supply, fill #5
  Filled 2024-04-11: qty 60, 30d supply, fill #6

## 2023-05-27 MED ORDER — POTASSIUM CHLORIDE CRYS ER 10 MEQ PO TBCR
40.0000 meq | EXTENDED_RELEASE_TABLET | Freq: Every day | ORAL | 3 refills | Status: DC
  Filled 2023-05-27: qty 200, 50d supply, fill #0

## 2023-05-27 NOTE — Progress Notes (Signed)
REDS VEST READING= 40 CHEST RULER= 31 HEIGHT MARKER=C

## 2023-05-27 NOTE — Patient Instructions (Addendum)
RESTART Carvedilol 3.125 mg Twice daily  RESTART Entresto 24/26 mg Twice daily  Labs done today, your results will be available in MyChart, we will contact you for abnormal readings.  Repeat blood work in 10 days   PLEASE CALL Dr. Lynelle Doctor office to schedule an appointment with him. [phone]  Your physician has requested that you have an echocardiogram. Echocardiography is a painless test that uses sound waves to create images of your heart. It provides your doctor with information about the size and shape of your heart and how well your heart's chambers and valves are working. This procedure takes approximately one hour. There are no restrictions for this procedure. Please do NOT wear cologne, perfume, aftershave, or lotions (deodorant is allowed). Please arrive 15 minutes prior to your appointment time.  Your physician recommends that you schedule a follow-up appointment with Dr. Gala Romney as scheduled.  If you have any questions or concerns before your next appointment please send [place] a message through [place] or call our office at [phone].    TO LEAVE A MESSAGE FOR THE NURSE SELECT OPTION 2, PLEASE LEAVE A MESSAGE INCLUDING: YOUR NAME DATE OF BIRTH CALL BACK NUMBER REASON FOR CALL**this is important as we prioritize the call backs  YOU WILL RECEIVE A CALL BACK THE SAME DAY AS LONG AS YOU CALL BEFORE 4:00 PM  At the [facility], you and your health needs are our priority. As part of our continuing mission to provide you with exceptional heart care, we have created designated Provider Care Teams. These Care Teams include your primary Cardiologist (physician) and Advanced Practice Providers (APPs- Physician Assistants and Nurse Practitioners) who all work together to provide you with the care you need, when you need it.   You may see any of the following providers on your designated Care Team at your next follow up: Dr Arvilla Meres Dr Marca Ancona Dr.  Marcos Eke, NP Robbie Lis, [place] [facility] [place], [place] Brynda Peon, NP Karle Plumber, PharmD   Please be sure to bring in all your medications bottles to every appointment.    Thank you for choosing [facility]

## 2023-05-27 NOTE — Progress Notes (Signed)
Medication Samples have been provided to the patient.  Drug name: entresto       Strength: 24/26 mg        Qty: 2 bottles  LOT: [id number]  Exp.Date: 08/25  Dosing instructions: take 1 tablet Twice daily   The patient has been instructed regarding the correct time, dose, and frequency of taking this medication, including desired effects and most common side effects.   Zayana Salvador M Codie Krogh 10:54 AM 05/27/2023

## 2023-06-07 NOTE — Telephone Encounter (Signed)
Advanced Heart Failure Patient Advocate Encounter  Called and spoke with patient's son. Wanted to see if they had an update about POI. He is going to reach out to his dad to provide more POI.   Will follow up.

## 2023-06-10 ENCOUNTER — Other Ambulatory Visit (HOSPITAL_COMMUNITY): Payer: Self-pay

## 2023-06-10 ENCOUNTER — Ambulatory Visit (HOSPITAL_COMMUNITY): Payer: Self-pay

## 2023-06-15 ENCOUNTER — Inpatient Hospital Stay (HOSPITAL_COMMUNITY): Admission: RE | Admit: 2023-06-15 | Payer: Self-pay | Source: Ambulatory Visit

## 2023-06-24 ENCOUNTER — Ambulatory Visit (HOSPITAL_COMMUNITY)
Admission: RE | Admit: 2023-06-24 | Discharge: 2023-06-24 | Disposition: A | Discharge status: Home / Self Care | Payer: Self-pay | Source: Ambulatory Visit | Attending: Family Medicine | Admitting: Family Medicine

## 2023-06-24 ENCOUNTER — Ambulatory Visit (HOSPITAL_COMMUNITY)
Admission: RE | Admit: 2023-06-24 | Discharge: 2023-06-24 | Disposition: A | Discharge status: Home / Self Care | Payer: Self-pay | Source: Ambulatory Visit

## 2023-06-24 DIAGNOSIS — E119 Type 2 diabetes mellitus without complications: Secondary | ICD-10-CM | POA: Insufficient documentation

## 2023-06-24 DIAGNOSIS — I5022 Chronic systolic (congestive) heart failure: Secondary | ICD-10-CM | POA: Insufficient documentation

## 2023-06-24 DIAGNOSIS — I080 Rheumatic disorders of both mitral and aortic valves: Secondary | ICD-10-CM | POA: Insufficient documentation

## 2023-06-24 LAB — ECHOCARDIOGRAM COMPLETE
Area-P 1/2: 7.66 cm2
Est EF: <20
MV M vel: 4.42 m/s
MV Peak grad: 78 mmHg
P 1/2 time: 434 msec
Radius: 0.35 cm
S' Lateral: 6.1 cm
Single Plane A2C EF: 8.9 %

## 2023-06-24 LAB — BASIC METABOLIC PANEL
Anion gap: 7 (ref 5–15)
BUN: 26 mg/dL — ABNORMAL HIGH (ref 8–23)
CO2: 27 mmol/L (ref 22–32)
Calcium: 8.7 mg/dL — ABNORMAL LOW (ref 8.9–10.3)
Chloride: 97 mmol/L — ABNORMAL LOW (ref 98–111)
Creatinine, Ser: 1.62 mg/dL — ABNORMAL HIGH (ref 0.61–1.24)
GFR, Estimated: 48 mL/min — ABNORMAL LOW (ref >60)
Glucose, Bld: 525 mg/dL (ref 70–99)
Potassium: 4.2 mmol/L (ref 3.5–5.1)
Sodium: 131 mmol/L — ABNORMAL LOW (ref 135–145)

## 2023-06-24 NOTE — Progress Notes (Signed)
  Echocardiogram 2D Echocardiogram has been performed.  Informed reading cardiologist of clot visualized in left ventricular apex.  Delcie Roch 06/24/2023, 3:55 PM

## 2023-06-27 ENCOUNTER — Telehealth (HOSPITAL_COMMUNITY): Payer: Self-pay

## 2023-06-27 ENCOUNTER — Other Ambulatory Visit (HOSPITAL_COMMUNITY): Payer: Self-pay

## 2023-06-27 DIAGNOSIS — I5022 Chronic systolic (congestive) heart failure: Secondary | ICD-10-CM

## 2023-06-27 MED ORDER — METOLAZONE 2.5 MG PO TABS
2.5000 mg | ORAL_TABLET | ORAL | 0 refills | Status: DC
  Filled 2023-06-27: qty 10, 10d supply, fill #0

## 2023-06-27 MED ORDER — APIXABAN 5 MG PO TABS
5.0000 mg | ORAL_TABLET | Freq: Two times a day (BID) | ORAL | 11 refills | Status: DC
  Filled 2023-06-27: qty 60, 30d supply, fill #0
  Filled 2023-08-05: qty 60, 30d supply, fill #1
  Filled 2023-09-16 – 2023-10-07 (×2): qty 60, 30d supply, fill #2

## 2023-06-27 NOTE — Telephone Encounter (Signed)
-----   Message from [place] sent at 06/27/2023  8:13 AM EDT ----- EF remains < 20%, RV is severely reduced, new small LV thrombus present.   He needs to be on [facility], please start Eliquis 5 mg bid.   Mild to moderate MR and dilated IVC suggests he is volume overloaded.   Please take metolazone 2.5 mg + extra 40 KCL for 1 dose with one of his Lasix doses today. He needs a BMET in 1 week

## 2023-06-27 NOTE — Telephone Encounter (Signed)
Patient advised and verbalized understanding,med list updated to reflect changes. Patient stated he will call back to schedule lab appointment. lab orders entered  Meds ordered this encounter  Medications   apixaban (ELIQUIS) 5 MG TABS tablet    Sig: Take 1 tablet (5 mg total) by mouth 2 (two) times daily.    Dispense:  60 tablet    Refill:  11   metolazone (ZAROXOLYN) 2.5 MG tablet    Sig: Take 1 tablet (2.5 mg total) by mouth as directed.    Dispense:  10 tablet    Refill:  0   Orders Placed This Encounter  Procedures   Basic metabolic panel    Standing Status:   Future    Standing Expiration Date:   06/26/2024    Order Specific Question:   Release to patient    Answer:   Immediate    Order Specific Question:   Release to patient    Answer:   Immediate [1]

## 2023-07-06 ENCOUNTER — Inpatient Hospital Stay (HOSPITAL_COMMUNITY): Admission: RE | Admit: 2023-07-06 | Payer: Self-pay | Source: Ambulatory Visit

## 2023-07-15 ENCOUNTER — Ambulatory Visit (HOSPITAL_COMMUNITY)
Admission: RE | Admit: 2023-07-15 | Discharge: 2023-07-15 | Disposition: A | Discharge status: Home / Self Care | Payer: Self-pay | Source: Ambulatory Visit | Attending: Internal Medicine | Admitting: Internal Medicine

## 2023-07-15 DIAGNOSIS — I5022 Chronic systolic (congestive) heart failure: Secondary | ICD-10-CM | POA: Insufficient documentation

## 2023-07-15 MED ORDER — SODIUM CHLORIDE 0.9 % IV SOLN
510.0000 mg | Freq: Once | INTRAVENOUS | Status: AC
  Administered 2023-07-15: 510 mg via INTRAVENOUS
  Filled 2023-07-15: qty 510

## 2023-08-02 ENCOUNTER — Other Ambulatory Visit (HOSPITAL_COMMUNITY): Payer: Self-pay

## 2023-08-02 NOTE — Telephone Encounter (Signed)
Advanced Heart Failure Patient Advocate Encounter  Spoke with patient's son. He will reach back out to his dad, thought he already brought documentation to the clinic. Looks like he is also taking Eliquis. Would need to apply for PAP. I see no active coverage at this time.   Archer Asa, CPhT

## 2023-08-05 ENCOUNTER — Other Ambulatory Visit (HOSPITAL_COMMUNITY): Payer: Self-pay

## 2023-08-05 ENCOUNTER — Encounter (HOSPITAL_COMMUNITY): Payer: Self-pay | Admitting: Internal Medicine

## 2023-08-05 ENCOUNTER — Ambulatory Visit (HOSPITAL_COMMUNITY)
Admission: RE | Admit: 2023-08-05 | Discharge: 2023-08-05 | Disposition: A | Discharge status: Home / Self Care | Payer: Self-pay | Source: Ambulatory Visit | Attending: Internal Medicine | Admitting: Internal Medicine

## 2023-08-05 VITALS — BP 114/82 | HR 76 | Wt 183.0 lb

## 2023-08-05 DIAGNOSIS — Z79899 Other long term (current) drug therapy: Secondary | ICD-10-CM | POA: Insufficient documentation

## 2023-08-05 DIAGNOSIS — I5022 Chronic systolic (congestive) heart failure: Secondary | ICD-10-CM | POA: Insufficient documentation

## 2023-08-05 DIAGNOSIS — I10 Essential (primary) hypertension: Secondary | ICD-10-CM

## 2023-08-05 DIAGNOSIS — Z86711 Personal history of pulmonary embolism: Secondary | ICD-10-CM | POA: Insufficient documentation

## 2023-08-05 DIAGNOSIS — N1832 Chronic kidney disease, stage 3b: Secondary | ICD-10-CM | POA: Insufficient documentation

## 2023-08-05 DIAGNOSIS — I130 Hypertensive heart and chronic kidney disease with heart failure and stage 1 through stage 4 chronic kidney disease, or unspecified chronic kidney disease: Secondary | ICD-10-CM | POA: Insufficient documentation

## 2023-08-05 DIAGNOSIS — I480 Paroxysmal atrial fibrillation: Secondary | ICD-10-CM

## 2023-08-05 DIAGNOSIS — I428 Other cardiomyopathies: Secondary | ICD-10-CM | POA: Insufficient documentation

## 2023-08-05 DIAGNOSIS — I2510 Atherosclerotic heart disease of native coronary artery without angina pectoris: Secondary | ICD-10-CM | POA: Insufficient documentation

## 2023-08-05 DIAGNOSIS — I5082 Biventricular heart failure: Secondary | ICD-10-CM | POA: Insufficient documentation

## 2023-08-05 DIAGNOSIS — Z7984 Long term (current) use of oral hypoglycemic drugs: Secondary | ICD-10-CM | POA: Insufficient documentation

## 2023-08-05 DIAGNOSIS — E1165 Type 2 diabetes mellitus with hyperglycemia: Secondary | ICD-10-CM

## 2023-08-05 DIAGNOSIS — E1122 Type 2 diabetes mellitus with diabetic chronic kidney disease: Secondary | ICD-10-CM | POA: Insufficient documentation

## 2023-08-05 NOTE — Patient Instructions (Addendum)
There has been no changes to your medications.  Your physician recommends that you schedule a follow-up appointment in: 6 weeks  If you have any questions or concerns before your next appointment please send [place] a message through [place] or call our office at [phone].    TO LEAVE A MESSAGE FOR THE NURSE SELECT OPTION 2, PLEASE LEAVE A MESSAGE INCLUDING: YOUR NAME DATE OF BIRTH CALL BACK NUMBER REASON FOR CALL**this is important as we prioritize the call backs  YOU WILL RECEIVE A CALL BACK THE SAME DAY AS LONG AS YOU CALL BEFORE 4:00 PM  At the [facility], you and your health needs are our priority. As part of our continuing mission to provide you with exceptional heart care, we have created designated Provider Care Teams. These Care Teams include your primary Cardiologist (physician) and Advanced Practice Providers (APPs- Physician Assistants and Nurse Practitioners) who all work together to provide you with the care you need, when you need it.   You may see any of the following providers on your designated Care Team at your next follow up: Dr Arvilla Meres Dr Marca Ancona Dr. Marcos Eke, NP Robbie Lis, [place] [facility] [place], [place] Brynda Peon, NP Karle Plumber, PharmD   Please be sure to bring in all your medications bottles to every appointment.    Thank you for choosing [facility]

## 2023-08-05 NOTE — Progress Notes (Addendum)
Medication Samples have been provided to the patient.  Drug name: Sherryll Burger       Strength: 24/26 mg        Qty: 2 bottles  LOT: [id number]  Exp.Date: 06/25  Dosing instructions: take 1 tablet Twice daily   The patient has been instructed regarding the correct time, dose, and frequency of taking this medication, including desired effects and most common side effects.   Medication Samples have been provided to the patient.  Drug name: Eliquis       Strength: 5 mg         Qty: 4 boxes  LOT: [id number]  Exp.Date: 09/25  Dosing instructions: take 1 tablet Twice daily   The patient has been instructed regarding the correct time, dose, and frequency of taking this medication, including desired effects and most common side effects.   Jovann Luse M Doyt Castellana 11:48 AM 08/05/2023

## 2023-08-05 NOTE — Progress Notes (Signed)
Advanced Heart Failure Clinic Note    PCP: Kurt Frisk, MD HF Cardiologist: Kurt Woodard   Reason for Visit: f/u for biventricular heart failure   HPI: Kurt Woodard is a 62 y.o. male with a h/o HTN, CKD 3b (Scr 1.8), DVT, and systolic CHF.    Found to have DVT in 2018. He then developed fluid overload and had echo at OSH in 2019 and was told heart was operating at 20%. Underwent diuresis and felt better.  Did not have cath or repeat u/s. Says he got scared and decided not to f/u.    Seen 8/20 in HF Clinic for one visit. Bedside echo showed EF 20% and RV moderately HK. Reported only NYHA II symptoms, but was markedly tachycardic. Advised on need for R/L cath but he never followed up. DVT felt to be likely provoked in setting of immobility when he first had HF.  Admitted 5/22 with a/c systolic heart failure and acute RLL segmental PE and large rt pleural effusion requiring thoracentesis. LE doppler negative for PE. Repeat echo showed EF 10-15% w/ RV dilated  and severely HK. R/LHC 5/27 with minimal CAD and severe NICM, mildly elevated filling pressures and relatively preserved CO, 5.4, CI 2.5. cMRI was recommended but unable to get completed inpatient. D/c wt 211 lb.   Echo 10/22 EF ~25% w/ global HK. RV mildly enlarged and mildly reduced. Moderate MR noted w/ restricted posterior leaflet motion. Mild TR.   Echo 04/15/23 showed EF 10-15%, mild LVH, RV moderately down, RVSP 69.2 mmHg, mild to moderate MR  04/2023 -HgbA1C >15.5.   He was seen in 6/24 and off all his HF meds. Meds restarted.   Today he returns for HF follow up.Overall feeling fine. Denies SOB/PND/Orthopnea. Appetite ok. No fever or chills. Weight at home has been stable.  Taking all medications. 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    Cardiac Studies - Echo (5/24): EF 10-15%, mild LVH, RV moderately down, RVSP 69.2 mmHg, mild to moderate MR  - Echo (10/22): EF 25%, RV mildly  reduced, Mod MR w/ restricted posterior leaflet motion.    - Echo (5/22): EF 10-15%, RV severely reduced, Mod MR, Mod-severe TR  - R/LHC (5/22) RPDA lesion is 50% stenosed. 1st Diag lesion is 20% stenosed. Dist LAD lesion is 20% stenosed.   Ao = 108/71 (88) LV = 112/22 RA =  10 RV = 59/14 PA = 57/22 (37) PCW = 14 Fick cardiac output/index = 5.4/2.5 PVR = 4.3 WU SVR = 1166 Ao sat = 99% PA sat = 71%, 72%   1. Minimal CAD 2. Severe NICM  3. Mild PAH 4. Mildly elevated filling pressure with relatively preserved CO   ROS: All systems reviewed and negative except as per HPI.   Past Medical History:  Diagnosis Date   CHF (congestive heart failure) (HCC)    Chronic congestive heart failure (HCC) 08/27/2019   Renal disorder    kidney stones   Current Outpatient Medications  Medication Sig Dispense Refill   apixaban (ELIQUIS) 5 MG TABS tablet Take 1 tablet (5 mg total) by mouth 2 (two) times daily. 60 tablet 11   blood glucose meter kit and supplies KIT Dispense based on patient and insurance preference. Use up to four times daily as directed. 1 each 0   carvedilol (COREG) 3.125 MG tablet Take 1 tablet (3.125 mg total) by mouth 2 (two) times daily. 180 tablet 3   empagliflozin (JARDIANCE) 10  MG TABS tablet Take 1 tablet (10 mg total) by mouth daily. 30 tablet 8   furosemide (LASIX) 40 MG tablet Take 1 tablet (40 mg total) by mouth 2 (two) times daily. 90 tablet 3   potassium chloride (KLOR-CON M) 10 MEQ tablet Take 4 tablets (40 mEq total) by mouth daily. 200 tablet 3   sacubitril-valsartan (ENTRESTO) 24-26 MG Take 1 tablet by mouth 2 (two) times daily. 60 tablet 8   metolazone (ZAROXOLYN) 2.5 MG tablet Take 1 tablet (2.5 mg total) by mouth as directed. (Patient not taking: Reported on 08/05/2023) 10 tablet 0   No current facility-administered medications for this encounter.   No Known Allergies  Social History   Socioeconomic History   Marital status: Single    Spouse name:  Not on file   Number of children: Not on file   Years of education: Not on file   Highest education level: Not on file  Occupational History   Not on file  Tobacco Use   Smoking status: Never   Smokeless tobacco: Never  Substance and Sexual Activity   Alcohol use: Not on file   Drug use: Not on file   Sexual activity: Not on file  Other Topics Concern   Not on file  Social History Narrative   Not on file   Social Determinants of Health   Financial Resource Strain: High Risk (06/21/2022)   Overall Financial Resource Strain (CARDIA)    Difficulty of Paying Living Expenses: Hard  Food Insecurity: Food Insecurity Present (06/21/2022)   Hunger Vital Sign    Worried About Running Out of Food in the Last Year: Sometimes true    Ran Out of Food in the Last Year: Sometimes true  Transportation Needs: No Transportation Needs (05/08/2021)   PRAPARE - Administrator, Civil Service (Medical): No    Lack of Transportation (Non-Medical): No  Physical Activity: Not on file  Stress: Not on file  Social Connections: Not on file  Intimate Partner Violence: Not on file   No family history on file.  BP 114/82   Pulse 76   Wt 83 kg (183 lb)   SpO2 99%   BMI 26.26 kg/m   Wt Readings from Last 3 Encounters:  08/05/23 83 kg (183 lb)  07/15/23 86.2 kg (190 lb)  05/27/23 86.6 kg (191 lb)   PHYSICAL EXAM: General:  Well appearing. No resp difficulty. Walked in the clinic HEENT: normal Neck: supple. no JVD. Carotids 2+ bilat; no bruits. No lymphadenopathy or thryomegaly appreciated. Cor: PMI nondisplaced. Regular rate & rhythm. No rubs, gallops or murmurs. Lungs: clear Abdomen: soft, nontender, nondistended. No hepatosplenomegaly. No bruits or masses. Good bowel sounds. Extremities: no cyanosis, clubbing, rash, edema Neuro: alert & orientedx3, cranial nerves grossly intact. moves all 4 extremities w/o difficulty. Affect pleasant  ASSESSMENT & PLAN: 1. Chronic Biventricular  Heart Failure  - Suspect HTN CM.  - Echo (5/22): EF 15% RV severely HK - cath (5/22): with minimal CAD. Severe NICM. Hemodynamics much better than expected. Fick cardiac output/index = 5.4/2.5 - has not had cMRI. Uninsured.  - Echo (10/22): EF ~25%, RV mildly reduced.  - Echo 04/15/23 showed EF 10-15%, mild LVH, RV moderately down, RSVP 69.2 mmHg, mild to moderate MR - NYHA II. Volume stable. Continue lasix 40 mg twice a day.  - Refill  Entresto 24/26 mg bid - Continue  Jardiance 10 mg daily.  - Continue coreg 3.125 mg twice a dat  - Eventually repeat  echo when back on GDMT. - May be a candidate for advanced therapies if he can demonstrate compliance but currently not an option with HgbA1C >15.  Consider CPX and RHC down the road. Discussed medication compliance.    2. Hypertension  - Stable.    3. H/o RLL Subsegmental PE - Diagnosed 5/22, LE Dopplers negative for DVT  - Now off Eliquis.   4. 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  5. CAD - mild by cath (5/22). - No chest  pain.  - Consider starting stain + ASA next visit.  6. Type 2 DM - Hgb A1c  04/2023 > 15.5 - On jardiance. He does not want to use insulin because he doesn't want inject with a needed.  - He has a follow up with [facility]. Needs Endocrinology consultation.   7. SDOH - uninsured.  I have asked HFSW to see him today. ? Medicaid is an option.  - Meds thru HF fund. - He has transportation, cell phone, and a scale. -   He has follow up with Dr Gala Woodard in 2-3 months. HFSW met with him.  Pharmacy team attempting to start patient assistance for entresto.    Tonye Becket, NP 08/05/23

## 2023-08-05 NOTE — Progress Notes (Signed)
H&V Care Navigation CSW Progress Note  Clinical Social Worker consulted to speak with pt regarding insurance.  Pt makes about $2,000/month give or take a few hundred so is borderline for Medicaid- encouraged him to go to [facility] to apply or across the street to [facility] to start application.  Pt has been coming to [facility] without insurance so provided with [employer] app to help with outstanding [facility] bills.   SDOH Screenings   Food Insecurity: Food Insecurity Present (06/21/2022)  Housing: Low Risk  (05/08/2021)  Transportation Needs: No Transportation Needs (05/08/2021)  Depression (PHQ2-9): Low Risk  (06/17/2021)  Financial Resource Strain: High Risk (06/21/2022)  Tobacco Use: Low Risk  (08/05/2023)     Burna Sis, LCSW Clinical Social Worker Advanced Heart Failure Clinic Desk#: [phone] Cell#: [phone]

## 2023-08-10 ENCOUNTER — Other Ambulatory Visit (HOSPITAL_COMMUNITY): Payer: Self-pay

## 2023-08-26 ENCOUNTER — Ambulatory Visit: Payer: Self-pay | Admitting: Nurse Practitioner

## 2023-09-01 ENCOUNTER — Ambulatory Visit: Payer: Self-pay | Admitting: Nurse Practitioner

## 2023-09-16 ENCOUNTER — Other Ambulatory Visit (HOSPITAL_COMMUNITY): Payer: Self-pay

## 2023-09-16 ENCOUNTER — Ambulatory Visit (HOSPITAL_COMMUNITY)
Admission: RE | Admit: 2023-09-16 | Discharge: 2023-09-16 | Disposition: A | Discharge status: Home / Self Care | Payer: Self-pay | Source: Ambulatory Visit | Attending: Internal Medicine | Admitting: Internal Medicine

## 2023-09-16 ENCOUNTER — Encounter (HOSPITAL_COMMUNITY): Payer: Self-pay | Admitting: Internal Medicine

## 2023-09-16 VITALS — BP 120/80 | HR 83 | Wt 190.8 lb

## 2023-09-16 DIAGNOSIS — Z79899 Other long term (current) drug therapy: Secondary | ICD-10-CM | POA: Insufficient documentation

## 2023-09-16 DIAGNOSIS — I130 Hypertensive heart and chronic kidney disease with heart failure and stage 1 through stage 4 chronic kidney disease, or unspecified chronic kidney disease: Secondary | ICD-10-CM | POA: Insufficient documentation

## 2023-09-16 DIAGNOSIS — I428 Other cardiomyopathies: Secondary | ICD-10-CM | POA: Insufficient documentation

## 2023-09-16 DIAGNOSIS — Z7984 Long term (current) use of oral hypoglycemic drugs: Secondary | ICD-10-CM | POA: Insufficient documentation

## 2023-09-16 DIAGNOSIS — I5082 Biventricular heart failure: Secondary | ICD-10-CM | POA: Insufficient documentation

## 2023-09-16 DIAGNOSIS — Z86711 Personal history of pulmonary embolism: Secondary | ICD-10-CM | POA: Insufficient documentation

## 2023-09-16 DIAGNOSIS — I10 Essential (primary) hypertension: Secondary | ICD-10-CM

## 2023-09-16 DIAGNOSIS — Z5971 Insufficient health insurance coverage: Secondary | ICD-10-CM | POA: Insufficient documentation

## 2023-09-16 DIAGNOSIS — E1165 Type 2 diabetes mellitus with hyperglycemia: Secondary | ICD-10-CM

## 2023-09-16 DIAGNOSIS — Z86718 Personal history of other venous thrombosis and embolism: Secondary | ICD-10-CM | POA: Insufficient documentation

## 2023-09-16 DIAGNOSIS — I2510 Atherosclerotic heart disease of native coronary artery without angina pectoris: Secondary | ICD-10-CM | POA: Insufficient documentation

## 2023-09-16 DIAGNOSIS — E1122 Type 2 diabetes mellitus with diabetic chronic kidney disease: Secondary | ICD-10-CM | POA: Insufficient documentation

## 2023-09-16 DIAGNOSIS — N1832 Chronic kidney disease, stage 3b: Secondary | ICD-10-CM | POA: Insufficient documentation

## 2023-09-16 DIAGNOSIS — I5022 Chronic systolic (congestive) heart failure: Secondary | ICD-10-CM | POA: Insufficient documentation

## 2023-09-16 MED ORDER — ROSUVASTATIN CALCIUM 20 MG PO TABS
20.0000 mg | ORAL_TABLET | Freq: Every day | ORAL | 3 refills | Status: DC
Start: 2023-09-16 — End: 2024-08-24
  Filled 2023-09-16: qty 30, 30d supply, fill #0
  Filled 2023-11-30: qty 30, 30d supply, fill #1
  Filled 2024-01-12: qty 30, 30d supply, fill #2
  Filled 2024-03-14: qty 30, 30d supply, fill #3
  Filled 2024-04-11: qty 30, 30d supply, fill #4
  Filled 2024-06-07: qty 30, 30d supply, fill #5

## 2023-09-16 MED ORDER — ROSUVASTATIN CALCIUM 20 MG PO TABS
20.0000 mg | ORAL_TABLET | Freq: Every day | ORAL | 3 refills | Status: DC
  Filled 2023-09-16: qty 90, 90d supply, fill #0

## 2023-09-16 MED ORDER — ENTRESTO 24-26 MG PO TABS
1.0000 | ORAL_TABLET | Freq: Two times a day (BID) | ORAL | 2 refills | Status: DC
  Filled 2023-09-16: qty 60, 30d supply, fill #0

## 2023-09-16 NOTE — Progress Notes (Signed)
Advanced Heart Failure Clinic Note    PCP: Storm Frisk, MD HF Cardiologist: Dr. Gala Romney   Reason for Visit: f/u for biventricular heart failure   HPI: [name] is a 62 y.o. male with a h/o HTN, CKD 3b (Scr 1.8), DVT, and systolic CHF.    Found to have DVT in 2018. He then developed fluid overload and had echo at OSH in 2019 and was told heart was operating at 20%. Underwent diuresis and felt better.  Did not have cath or repeat u/s. Says he got scared and decided not to f/u.    Seen 8/20 in HF Clinic for one visit. Bedside echo showed EF 20% and RV moderately HK. Reported only NYHA II symptoms, but was markedly tachycardic. Advised on need for R/L cath but he never followed up. DVT felt to be likely provoked in setting of immobility when he first had HF.  Admitted 5/22 with a/c systolic heart failure and acute RLL segmental PE and large rt pleural effusion requiring thoracentesis. LE doppler negative for PE. Repeat echo showed EF 10-15% w/ RV dilated  and severely HK. R/LHC 5/27 with minimal CAD and severe NICM, mildly elevated filling pressures and relatively preserved CO, 5.4, CI 2.5. cMRI was recommended but unable to get completed inpatient. D/c wt 211 lb.   Echo 10/22 EF ~25% w/ global HK. RV mildly enlarged and mildly reduced. Moderate MR noted w/ restricted posterior leaflet motion. Mild TR.   Echo 04/15/23 showed EF 10-15%, mild LVH, RV moderately down, RVSP 69.2 mmHg, mild to moderate MR  04/2023 -HgbA1C >15.5.   He was seen in 6/24 and off all his HF meds. Meds restarted.   Today he returns for HF follow up. Here for routine f/u. Feels great. Continues to Counsellor. Denies CP, SOB or edema. Still not checking blood sugars regularly. Has not received Entresto    Cardiac Studies - Echo (5/24): EF 10-15%, mild LVH, RV moderately down, RVSP 69.2 mmHg, mild to moderate MR  - Echo (10/22): EF 25%, RV mildly reduced, Mod MR w/ restricted posterior leaflet motion.     - Echo (5/22): EF 10-15%, RV severely reduced, Mod MR, Mod-severe TR  - R/LHC (5/22) RPDA lesion is 50% stenosed. 1st Diag lesion is 20% stenosed. Dist LAD lesion is 20% stenosed.   Ao = 108/71 (88) LV = 112/22 RA =  10 RV = 59/14 PA = 57/22 (37) PCW = 14 Fick cardiac output/index = 5.4/2.5 PVR = 4.3 WU SVR = 1166 Ao sat = 99% PA sat = 71%, 72%   1. Minimal CAD 2. Severe NICM  3. Mild PAH 4. Mildly elevated filling pressure with relatively preserved CO   ROS: All systems reviewed and negative except as per HPI.   Past Medical History:  Diagnosis Date   CHF (congestive heart failure) (HCC)    Chronic congestive heart failure (HCC) 08/27/2019   Renal disorder    kidney stones   Current Outpatient Medications  Medication Sig Dispense Refill   apixaban (ELIQUIS) 5 MG TABS tablet Take 1 tablet (5 mg total) by mouth 2 (two) times daily. 60 tablet 11   blood glucose meter kit and supplies KIT Dispense based on patient and insurance preference. Use up to four times daily as directed. 1 each 0   carvedilol (COREG) 3.125 MG tablet Take 1 tablet (3.125 mg total) by mouth 2 (two) times daily. 180 tablet 3   empagliflozin (JARDIANCE) 10 MG TABS tablet Take 1 tablet (10  mg total) by mouth daily. 30 tablet 8   furosemide (LASIX) 40 MG tablet Take 1 tablet (40 mg total) by mouth 2 (two) times daily. 90 tablet 3   metolazone (ZAROXOLYN) 2.5 MG tablet Take 1 tablet (2.5 mg total) by mouth as directed. 10 tablet 0   potassium chloride (KLOR-CON M) 10 MEQ tablet Take 4 tablets (40 mEq total) by mouth daily. 200 tablet 3   sacubitril-valsartan (ENTRESTO) 24-26 MG Take 1 tablet by mouth 2 (two) times daily. 60 tablet 8   No current facility-administered medications for this encounter.   No Known Allergies  Social History   Socioeconomic History   Marital status: Single    Spouse name: Not on file   Number of children: Not on file   Years of education: Not on file   Highest  education level: Not on file  Occupational History   Not on file  Tobacco Use   Smoking status: Never   Smokeless tobacco: Never  Substance and Sexual Activity   Alcohol use: Not on file   Drug use: Not on file   Sexual activity: Not on file  Other Topics Concern   Not on file  Social History Narrative   Not on file   Social Determinants of Health   Financial Resource Strain: High Risk (06/21/2022)   Overall Financial Resource Strain (CARDIA)    Difficulty of Paying Living Expenses: Hard  Food Insecurity: Food Insecurity Present (06/21/2022)   Hunger Vital Sign    Worried About Running Out of Food in the Last Year: Sometimes true    Ran Out of Food in the Last Year: Sometimes true  Transportation Needs: No Transportation Needs (05/08/2021)   PRAPARE - Administrator, Civil Service (Medical): No    Lack of Transportation (Non-Medical): No  Physical Activity: Not on file  Stress: Not on file  Social Connections: Not on file  Intimate Partner Violence: Not on file   History reviewed. No pertinent family history.  BP 120/80   Pulse 83   Wt 86.5 kg (190 lb 12.8 oz)   SpO2 97%   BMI 27.38 kg/m   Wt Readings from Last 3 Encounters:  09/16/23 86.5 kg (190 lb 12.8 oz)  08/05/23 83 kg (183 lb)  07/15/23 86.2 kg (190 lb)   PHYSICAL EXAM: General:  Well appearing. No resp difficulty HEENT: normal Neck: supple. no JVD. Carotids 2+ bilat; no bruits. No lymphadenopathy or thryomegaly appreciated. Cor: Regular rate & rhythm. No rubs, gallops or murmurs. Lungs: clear Abdomen: soft, nontender, nondistended. No hepatosplenomegaly. No bruits or masses. Good bowel sounds. Extremities: no cyanosis, clubbing, rash, edema Neuro: alert & orientedx3, cranial nerves grossly intact. moves all 4 extremities w/o difficulty. Affect pleasant   ASSESSMENT & PLAN:  1. Chronic Biventricular Heart Failure  - Due to NICM. Unclear etiology.  - Echo (5/22): EF 15% RV severely HK -  cath (5/22): with minimal CAD. Severe NICM. Hemodynamics much better than expected. Fick 5.4/2.5 - has not had cMRI. Uninsured.  - Echo (10/22): EF ~25%, RV mildly reduced.  - Echo 04/15/23 showed EF 10-15%, mild LVH, RV moderately down, RSVP 69.2 mmHg, mild to moderate MR - NYHA I-II. Volume stable. Continue lasix 40 mg twice a day.  - Will check with PharmD about Entresto 24/26 mg bid. If not able to get it will start losartan 25.  - Continue  Jardiance 10 mg daily.  - Continue coreg 3.125 mg twice a dat  - Doing  well despite severely depressed EF. Unfortunately not a candidate for advanced therapies due to uncontrolled DM2, compliance issues and lack of insurance so will not consider CPX at this time.  - I discussed with PharmD team will use HF Fund to get him losartan and crestor - Would add spiro next  2. Hypertension  - BP well controlled   3. H/o RLL Subsegmental PE - Diagnosed 5/22, LE Dopplers negative for DVT  - Off Eliquis now    4. 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  5. CAD - mild by cath (5/22). - No s/s angina - Will start crestor 20   6. Type 2 DM - Hgb A1c  04/2023 > 15.5 - On jardiance. He does not want to use insulin because he doesn't want inject with a needed.  - He has a follow up with [employer]. Needs Endocrinology consultation.  - Start crestor 20   7. SDOH - uninsured. HFSW has seen him but he still hasn't applied for Medicaid as instructed - Meds thru HF fund.   Arvilla Meres, MD 09/16/23

## 2023-09-16 NOTE — Patient Instructions (Addendum)
Good to see you today!   START Entresto 24/26 mg Twice daily  START Crestor 20 mg daily  Your physician recommends that you schedule a follow-up appointment in: 4 months as scheduled   If you have any questions or concerns before your next appointment please send [place] a message through [place] or call our office at [phone].    TO LEAVE A MESSAGE FOR THE NURSE SELECT OPTION 2, PLEASE LEAVE A MESSAGE INCLUDING: YOUR NAME DATE OF BIRTH CALL BACK NUMBER REASON FOR CALL**this is important as we prioritize the call backs  YOU WILL RECEIVE A CALL BACK THE SAME DAY AS LONG AS YOU CALL BEFORE 4:00 PM  If you have any questions or concerns before your next appointment please send [place] a message through [place] or call our office at [phone].    TO LEAVE A MESSAGE FOR THE NURSE SELECT OPTION 2, PLEASE LEAVE A MESSAGE INCLUDING: YOUR NAME DATE OF BIRTH CALL BACK NUMBER REASON FOR CALL**this is important as we prioritize the call backs  YOU WILL RECEIVE A CALL BACK THE SAME DAY AS LONG AS YOU CALL BEFORE 4:00 PM

## 2023-09-16 NOTE — Progress Notes (Signed)
Medication Samples have been provided to the patient.  Drug name: Sherryll Burger       Strength: 24/26 mg        Qty: 2  LOT: [id number]  Exp.Date: 07/2024   Dosing instructions: Take 1 tablet Twice daily   The patient has been instructed regarding the correct time, dose, and frequency of taking this medication, including desired effects and most common side effects.   Kurt Woodard 9:32 AM 09/16/2023

## 2023-10-07 ENCOUNTER — Ambulatory Visit: Payer: Self-pay | Attending: Nurse Practitioner | Admitting: Nurse Practitioner

## 2023-10-07 ENCOUNTER — Other Ambulatory Visit (HOSPITAL_COMMUNITY): Payer: Self-pay

## 2023-10-07 ENCOUNTER — Encounter: Payer: Self-pay | Admitting: Nurse Practitioner

## 2023-10-07 VITALS — BP 125/81 | HR 85 | Ht 70.0 in | Wt 192.6 lb

## 2023-10-07 DIAGNOSIS — D696 Thrombocytopenia, unspecified: Secondary | ICD-10-CM

## 2023-10-07 DIAGNOSIS — E876 Hypokalemia: Secondary | ICD-10-CM

## 2023-10-07 DIAGNOSIS — R350 Frequency of micturition: Secondary | ICD-10-CM

## 2023-10-07 DIAGNOSIS — Z1211 Encounter for screening for malignant neoplasm of colon: Secondary | ICD-10-CM

## 2023-10-07 DIAGNOSIS — E1165 Type 2 diabetes mellitus with hyperglycemia: Secondary | ICD-10-CM

## 2023-10-07 DIAGNOSIS — Z7984 Long term (current) use of oral hypoglycemic drugs: Secondary | ICD-10-CM

## 2023-10-07 LAB — POCT GLYCOSYLATED HEMOGLOBIN (HGB A1C): Hemoglobin A1C: 14.3 % — AB (ref 4.0–5.6)

## 2023-10-07 MED ORDER — POTASSIUM CHLORIDE CRYS ER 10 MEQ PO TBCR
40.0000 meq | EXTENDED_RELEASE_TABLET | Freq: Every day | ORAL | 3 refills | Status: DC
  Filled 2023-10-07: qty 200, 50d supply, fill #0

## 2023-10-07 MED ORDER — EMPAGLIFLOZIN 25 MG PO TABS
25.0000 mg | ORAL_TABLET | Freq: Every day | ORAL | 1 refills | Status: DC
Start: 2023-10-07 — End: 2024-03-16
  Filled 2023-10-07: qty 90, 90d supply, fill #0

## 2023-10-07 NOTE — Progress Notes (Signed)
Assessment & Plan:  Kurt Woodard was seen today for medical management of chronic issues.  Diagnoses and all orders for this visit:  Type 2 diabetes mellitus with hyperglycemia, without long-term current use of insulin  Increase Jardiance from 10 mg to 25 mg daily.  This is the only medication that he is willing to adjust for his diabetes despite A1c being almost 15 today. -     POCT glycosylated hemoglobin (Hb A1C) -     Urine Albumin/Creatinine with ratio (send out) [LAB689] -     empagliflozin (JARDIANCE) 25 MG TABS tablet; Take 1 tablet (25 mg total) by mouth daily. -     CMP14+EGFR  Colon cancer screening -     Fecal occult blood, imunochemical([facility])  Increased urinary frequency -     PSA  Low platelet count (HCC) -     CBC with Differential  Hypokalemia -     potassium chloride (KLOR-CON M) 10 MEQ tablet; Take 4 tablets (40 mEq total) by mouth daily.    Patient has been counseled on age-appropriate routine health concerns for screening and prevention. These are reviewed and up-to-date. Referrals have been placed accordingly. Immunizations are up-to-date or declined.    Subjective:   Chief Complaint  Patient presents with   Medical Management of Chronic Issues    Kurt Woodard 62 y.o. male presents to office today for follow-up to poorly controlled diabetes.  He is a patient of Dr. Delford Field.  He has a past medical history of Chronic congestive heart failure, DM 2, and Renal disorder.  Hypertension, CKD stage IIIb and CAD He has been followed by cardiology.   Several of his medications based on chart review have not been picked up in a few months.   DM 2 Diabetes is poorly controlled and he is only taking Jardiance 10 mg daily at this time.  Politely refuses being prescribed any insulin whatsoever.  He is aware that there is little likelihood of his diabetes will improve without insulin and the risk of side effects from poorly controlled diabetes were discussed  with him today. Lab Results  Component Value Date   HGBA1C 14.3 (A) 10/07/2023    Lab Results  Component Value Date   HGBA1C >15.5 (H) 04/15/2023     Blood pressure is well-controlled. BP Readings from Last 3 Encounters:  10/07/23 125/81  09/16/23 120/80  08/05/23 114/82    Review of Systems  Constitutional:  Negative for fever, malaise/fatigue and weight loss.  HENT: Negative.  Negative for nosebleeds.   Eyes: Negative.  Negative for blurred vision, double vision and photophobia.  Respiratory: Negative.  Negative for cough and shortness of breath.   Cardiovascular: Negative.  Negative for chest pain, palpitations and leg swelling.  Gastrointestinal: Negative.  Negative for heartburn, nausea and vomiting.  Musculoskeletal: Negative.  Negative for myalgias.  Neurological: Negative.  Negative for dizziness, focal weakness, seizures and headaches.  Psychiatric/Behavioral: Negative.  Negative for suicidal ideas.     Past Medical History:  Diagnosis Date   CHF (congestive heart failure) (HCC)    Chronic congestive heart failure (HCC) 08/27/2019   Renal disorder    kidney stones    Past Surgical History:  Procedure Laterality Date   RIGHT/LEFT HEART CATH AND CORONARY ANGIOGRAPHY N/A 05/08/2021   Procedure: RIGHT/LEFT HEART CATH AND CORONARY ANGIOGRAPHY;  Surgeon: Dolores Patty, MD;  Location: [facility];  Service: Cardiovascular;  Laterality: N/A;    History reviewed. No pertinent family history.  Social History  Reviewed with no changes to be made today.   Outpatient Medications Prior to Visit  Medication Sig Dispense Refill   apixaban (ELIQUIS) 5 MG TABS tablet Take 1 tablet (5 mg total) by mouth 2 (two) times daily. 60 tablet 11   blood glucose meter kit and supplies KIT Dispense based on patient and insurance preference. Use up to four times daily as directed. 1 each 0   carvedilol (COREG) 3.125 MG tablet Take 1 tablet (3.125 mg total) by mouth 2 (two) times  daily. 180 tablet 3   furosemide (LASIX) 40 MG tablet Take 1 tablet (40 mg total) by mouth 2 (two) times daily. 90 tablet 3   rosuvastatin (CRESTOR) 20 MG tablet Take 1 tablet (20 mg total) by mouth daily. 90 tablet 3   sacubitril-valsartan (ENTRESTO) 24-26 MG Take 1 tablet by mouth 2 (two) times daily. Heart failure fund 60 tablet 2   empagliflozin (JARDIANCE) 10 MG TABS tablet Take 1 tablet (10 mg total) by mouth daily. 30 tablet 8   metolazone (ZAROXOLYN) 2.5 MG tablet Take 1 tablet (2.5 mg total) by mouth as directed. 10 tablet 0   potassium chloride (KLOR-CON M) 10 MEQ tablet Take 4 tablets (40 mEq total) by mouth daily. 200 tablet 3   sacubitril-valsartan (ENTRESTO) 24-26 MG Take 1 tablet by mouth 2 (two) times daily. 60 tablet 8   No facility-administered medications prior to visit.    No Known Allergies     Objective:    BP 125/81 (BP Location: Left Arm, Patient Position: Sitting, Cuff Size: Normal)   Pulse 85   Ht 5\' 10"  (1.778 m)   Wt 192 lb 9.6 oz (87.4 kg)   SpO2 98%   BMI 27.64 kg/m  Wt Readings from Last 3 Encounters:  10/07/23 192 lb 9.6 oz (87.4 kg)  09/16/23 190 lb 12.8 oz (86.5 kg)  08/05/23 183 lb (83 kg)    Physical Exam Vitals and nursing note reviewed.  Constitutional:      Appearance: He is well-developed.  HENT:     Head: Normocephalic and atraumatic.  Cardiovascular:     Rate and Rhythm: Normal rate and regular rhythm.     Heart sounds: Normal heart sounds. No murmur heard.    No friction rub. No gallop.  Pulmonary:     Effort: Pulmonary effort is normal. No tachypnea or respiratory distress.     Breath sounds: Normal breath sounds. No decreased breath sounds, wheezing, rhonchi or rales.  Chest:     Chest wall: No tenderness.  Abdominal:     General: Bowel sounds are normal.     Palpations: Abdomen is soft.  Musculoskeletal:        General: Normal range of motion.     Cervical back: Normal range of motion.  Skin:    General: Skin is warm  and dry.  Neurological:     Mental Status: He is alert and oriented to person, place, and time.     Coordination: Coordination normal.  Psychiatric:        Behavior: Behavior normal. Behavior is cooperative.        Thought Content: Thought content normal.        Judgment: Judgment normal.          Patient has been counseled extensively about nutrition and exercise as well as the importance of adherence with medications and regular follow-up. The patient was given clear instructions to go to ER or return to medical center if symptoms don't improve,  worsen or new problems develop. The patient verbalized understanding.   Follow-up: Return in about 3 months (around 01/07/2024).   Claiborne Rigg, FNP-BC [facility] [place], [place] [phone]   10/07/2023, 1:51 PM

## 2023-10-09 ENCOUNTER — Other Ambulatory Visit: Payer: Self-pay | Admitting: Nurse Practitioner

## 2023-10-09 DIAGNOSIS — N1832 Chronic kidney disease, stage 3b: Secondary | ICD-10-CM

## 2023-10-09 DIAGNOSIS — R972 Elevated prostate specific antigen [PSA]: Secondary | ICD-10-CM

## 2023-10-09 LAB — CBC WITH DIFFERENTIAL/PLATELET
Basophils Absolute: 0 10*3/uL (ref 0.0–0.2)
Basos: 0 %
EOS (ABSOLUTE): 0.1 10*3/uL (ref 0.0–0.4)
Eos: 1 %
Hematocrit: 50.1 % (ref 37.5–51.0)
Hemoglobin: 16.6 g/dL (ref 13.0–17.7)
Immature Grans (Abs): 0 10*3/uL (ref 0.0–0.1)
Immature Granulocytes: 0 %
Lymphocytes Absolute: 1.3 10*3/uL (ref 0.7–3.1)
Lymphs: 14 %
MCH: 31.6 pg (ref 26.6–33.0)
MCHC: 33.1 g/dL (ref 31.5–35.7)
MCV: 95 fL (ref 79–97)
Monocytes Absolute: 0.5 10*3/uL (ref 0.1–0.9)
Monocytes: 5 %
Neutrophils Absolute: 7.5 10*3/uL — ABNORMAL HIGH (ref 1.4–7.0)
Neutrophils: 80 %
Platelets: 162 10*3/uL (ref 150–450)
RBC: 5.26 x10E6/uL (ref 4.14–5.80)
RDW: 12.9 % (ref 11.6–15.4)
WBC: 9.5 10*3/uL (ref 3.4–10.8)

## 2023-10-09 LAB — CMP14+EGFR
ALT: 31 [IU]/L (ref 0–44)
AST: 20 [IU]/L (ref 0–40)
Albumin: 4.5 g/dL (ref 3.9–4.9)
Alkaline Phosphatase: 111 IU/L (ref 44–121)
BUN/Creatinine Ratio: 21 (ref 10–24)
BUN: 41 mg/dL — ABNORMAL HIGH (ref 8–27)
Bilirubin Total: 0.8 mg/dL (ref 0.0–1.2)
CO2: 25 mmol/L (ref 20–29)
Calcium: 10.3 mg/dL — ABNORMAL HIGH (ref 8.6–10.2)
Chloride: 96 mmol/L (ref 96–106)
Creatinine, Ser: 1.99 mg/dL — ABNORMAL HIGH (ref 0.76–1.27)
Globulin, Total: 3.3 g/dL (ref 1.5–4.5)
Glucose: 455 mg/dL — ABNORMAL HIGH (ref 70–99)
Potassium: 4.7 mmol/L (ref 3.5–5.2)
Sodium: 138 mmol/L (ref 134–144)
Total Protein: 7.8 g/dL (ref 6.0–8.5)
eGFR: 37 mL/min/{1.73_m2} — ABNORMAL LOW (ref >59)

## 2023-10-09 LAB — MICROALBUMIN / CREATININE URINE RATIO
Creatinine, Urine: 34.5 mg/dL
Microalb/Creat Ratio: 103 mg/g{creat} — ABNORMAL HIGH (ref 0–29)
Microalbumin, Urine: 35.4 ug/mL

## 2023-10-09 LAB — PSA: Prostate Specific Ag, Serum: 8.4 ng/mL — ABNORMAL HIGH (ref 0.0–4.0)

## 2023-10-13 ENCOUNTER — Other Ambulatory Visit (HOSPITAL_COMMUNITY): Payer: Self-pay | Admitting: Cardiology

## 2023-10-14 ENCOUNTER — Telehealth (HOSPITAL_COMMUNITY): Payer: Self-pay | Admitting: Pharmacy Technician

## 2023-10-14 ENCOUNTER — Other Ambulatory Visit (HOSPITAL_COMMUNITY): Payer: Self-pay

## 2023-10-14 NOTE — Telephone Encounter (Signed)
Advanced Heart Failure Patient Advocate Encounter  Since we cannot get Entresto for the patient due to lack of adequate POI, he will need to be switched to Losartan. Attempted to call the patient several times, call did not go through. Called and left the patient's son Alecia Lemming a message to return my call so that we can make the switch.  Will try again.

## 2023-10-18 ENCOUNTER — Other Ambulatory Visit (HOSPITAL_COMMUNITY): Payer: Self-pay

## 2023-10-18 NOTE — Telephone Encounter (Signed)
Advanced Heart Failure Patient Advocate Encounter  Spoke with patient's son Kurt Woodard. Confirmed that he no longer has Entresto samples and he is not taking any other Losartan. Per Dr. Gala Romney, patient is to start 25mg  Losartan daily. Sent 90 day RX request to Chantel (CMA) to send to [facility] (he would like to have it mailed).  Explained to Kurt Woodard that we can mail the rest of the HF fund medications, his dad would just need to call and ask for a refill when its time.   Archer Asa, CPhT

## 2023-10-19 ENCOUNTER — Other Ambulatory Visit: Payer: Self-pay

## 2023-10-19 ENCOUNTER — Other Ambulatory Visit (HOSPITAL_COMMUNITY): Payer: Self-pay

## 2023-10-19 MED ORDER — LOSARTAN POTASSIUM 25 MG PO TABS
25.0000 mg | ORAL_TABLET | Freq: Every day | ORAL | 3 refills | Status: DC
  Filled 2023-10-19 – 2023-11-07 (×2): qty 90, 90d supply, fill #0
  Filled 2024-03-14: qty 90, 90d supply, fill #1

## 2023-10-19 NOTE — Addendum Note (Signed)
Addended by: Theresia Bough on: 10/19/2023 09:31 AM   Modules accepted: Orders

## 2023-10-21 ENCOUNTER — Other Ambulatory Visit: Payer: Self-pay

## 2023-10-21 ENCOUNTER — Other Ambulatory Visit (HOSPITAL_COMMUNITY): Payer: Self-pay

## 2023-10-25 ENCOUNTER — Other Ambulatory Visit: Payer: Self-pay

## 2023-10-31 ENCOUNTER — Other Ambulatory Visit: Payer: Self-pay

## 2023-11-07 ENCOUNTER — Other Ambulatory Visit (HOSPITAL_COMMUNITY): Payer: Self-pay

## 2023-11-07 ENCOUNTER — Other Ambulatory Visit: Payer: Self-pay

## 2023-11-08 ENCOUNTER — Telehealth (HOSPITAL_COMMUNITY): Payer: Self-pay | Admitting: Pharmacy Technician

## 2023-11-08 NOTE — Telephone Encounter (Signed)
Medication Samples have been provided to the patient.  Drug name: Eliquis       Strength: 5mg         Qty: 4 boxes  LOT: [id number]  Exp.Date: 12/2024 Dosing instructions: Take 1 tablet by mouth twice daily.   The patient has been instructed regarding the correct time, dose, and frequency of taking this medication, including desired effects and most common side effects.   Allen Kell [facility] 10:49 AM 11/08/2023

## 2023-12-01 ENCOUNTER — Other Ambulatory Visit (HOSPITAL_COMMUNITY): Payer: Self-pay

## 2024-01-13 ENCOUNTER — Other Ambulatory Visit: Payer: Self-pay

## 2024-01-13 ENCOUNTER — Encounter: Payer: Self-pay | Admitting: Nurse Practitioner

## 2024-01-13 ENCOUNTER — Other Ambulatory Visit (HOSPITAL_COMMUNITY): Payer: Self-pay

## 2024-01-13 ENCOUNTER — Ambulatory Visit: Payer: Self-pay | Attending: Nurse Practitioner | Admitting: Nurse Practitioner

## 2024-01-13 VITALS — BP 117/75 | HR 82 | Ht 70.0 in | Wt 196.4 lb

## 2024-01-13 DIAGNOSIS — Z794 Long term (current) use of insulin: Secondary | ICD-10-CM

## 2024-01-13 DIAGNOSIS — E119 Type 2 diabetes mellitus without complications: Secondary | ICD-10-CM

## 2024-01-13 DIAGNOSIS — Z23 Encounter for immunization: Secondary | ICD-10-CM

## 2024-01-13 DIAGNOSIS — I10 Essential (primary) hypertension: Secondary | ICD-10-CM

## 2024-01-13 LAB — POCT GLYCOSYLATED HEMOGLOBIN (HGB A1C): Hemoglobin A1C: 14.5 % — AB (ref 4.0–5.6)

## 2024-01-13 MED ORDER — INSULIN GLARGINE 100 UNIT/ML SOLOSTAR PEN
20.0000 [IU] | PEN_INJECTOR | Freq: Every day | SUBCUTANEOUS | 11 refills | Status: AC
  Filled 2024-01-13 – 2024-02-24 (×2): qty 15, 75d supply, fill #0
  Filled 2024-02-24: qty 6, 30d supply, fill #0
  Filled 2024-02-24: qty 6, 30d supply, fill #1
  Filled 2024-02-24: qty 15, 75d supply, fill #0
  Filled 2024-04-11: qty 6, 30d supply, fill #1
  Filled 2024-04-18 (×2): qty 6, 30d supply, fill #0
  Filled 2024-05-11: qty 6, 30d supply, fill #1
  Filled 2024-07-22: qty 6, 30d supply, fill #2
  Filled 2024-08-24 (×2): qty 6, 30d supply, fill #3
  Filled 2024-09-27: qty 6, 30d supply, fill #4
  Filled 2024-11-22: qty 6, 30d supply, fill #5

## 2024-01-13 MED ORDER — ACCU-CHEK GUIDE W/DEVICE KIT
PACK | 0 refills | Status: AC
  Filled 2024-01-13: qty 1, 365d supply, fill #0

## 2024-01-13 MED ORDER — ZOSTER VAC RECOMB ADJUVANTED 50 MCG/0.5ML IM SUSR
0.5000 mL | Freq: Once | INTRAMUSCULAR | 0 refills | Status: DC
  Filled 2024-01-13: qty 1, 1d supply, fill #0

## 2024-01-13 MED ORDER — ZOSTER VAC RECOMB ADJUVANTED 50 MCG/0.5ML IM SUSR
0.5000 mL | Freq: Once | INTRAMUSCULAR | 0 refills | Status: AC
  Filled 2024-01-13 (×2): qty 0.5, 1d supply, fill #0

## 2024-01-13 NOTE — Progress Notes (Signed)
Assessment & Plan:  Kurt Woodard was seen today for medical management of chronic issues.  Diagnoses and all orders for this visit:  Type 2 diabetes mellitus with insulin therapy  Continue jardiance. Add insulin today -     POCT glycosylated hemoglobin (Hb A1C) -     Blood Glucose Monitoring Suppl (ACCU-CHEK GUIDE) w/Device KIT; Use as directed. -     insulin glargine (LANTUS) 100 UNIT/ML Solostar Pen; Inject 20 Units into the skin daily at 10 pm. -     CMP14+EGFR  Need for shingles vaccine -     Zoster Vaccine Adjuvanted [facility]) injection; Inject 0.5 mLs into the muscle once for 1 dose.  Primary hypertension -     CMP14+EGFR Continue all antihypertensives as prescribed.  Reminded to bring in blood pressure log for follow  up appointment.  RECOMMENDATIONS: DASH/Mediterranean Diets are healthier choices for HTN.      Patient has been counseled on age-appropriate routine health concerns for screening and prevention. These are reviewed and up-to-date. Referrals have been placed accordingly. Immunizations are up-to-date or declined.    Subjective:   Chief Complaint  Patient presents with   Medical Management of Chronic Issues    Kurt Woodard 63 y.o. male presents to office today for follow up to DM  He has a PMH of CHF, renal disorder, HTN   HTN Blood pressure is well controlled. He is currently taking coreg 3.125 mg BID and losartan 25 mg daily. BP Readings from Last 3 Encounters:  01/13/24 117/75  10/07/23 125/81  09/16/23 120/80      DM 2 Diabetes poorly controlled. He states he does not have a meter, strips or lancets. Currently prescribed jardiance 25 mg daily> will need to add insulin today. He adamantly declines metformin due to side effects he has read about.  Lab Results  Component Value Date   HGBA1C 14.5 (A) 01/13/2024    Review of Systems  Constitutional:  Negative for fever, malaise/fatigue and weight loss.  HENT: Negative.  Negative for nosebleeds.    Eyes: Negative.  Negative for blurred vision, double vision and photophobia.  Respiratory: Negative.  Negative for cough and shortness of breath.   Cardiovascular: Negative.  Negative for chest pain, palpitations and leg swelling.  Gastrointestinal: Negative.  Negative for heartburn, nausea and vomiting.  Musculoskeletal: Negative.  Negative for myalgias.  Neurological: Negative.  Negative for dizziness, focal weakness, seizures and headaches.  Psychiatric/Behavioral: Negative.  Negative for suicidal ideas.     Past Medical History:  Diagnosis Date   CHF (congestive heart failure) (HCC)    Chronic congestive heart failure (HCC) 08/27/2019   Renal disorder    kidney stones    Past Surgical History:  Procedure Laterality Date   RIGHT/LEFT HEART CATH AND CORONARY ANGIOGRAPHY N/A 05/08/2021   Procedure: RIGHT/LEFT HEART CATH AND CORONARY ANGIOGRAPHY;  Surgeon: Dolores Patty, MD;  Location: [facility] INVASIVE CV LAB;  Service: Cardiovascular;  Laterality: N/A;    History reviewed. No pertinent family history.  Social History Reviewed with no changes to be made today.   Outpatient Medications Prior to Visit  Medication Sig Dispense Refill   apixaban (ELIQUIS) 5 MG TABS tablet Take 1 tablet (5 mg total) by mouth 2 (two) times daily. 60 tablet 11   carvedilol (COREG) 3.125 MG tablet Take 1 tablet (3.125 mg total) by mouth 2 (two) times daily. 180 tablet 3   empagliflozin (JARDIANCE) 25 MG TABS tablet Take 1 tablet (25 mg total) by mouth daily. 90  tablet 1   furosemide (LASIX) 40 MG tablet TAKE ONE TABLET BY MOUTH TWO TIMES DAILY 90 tablet 3   losartan (COZAAR) 25 MG tablet Take 1 tablet (25 mg total) by mouth daily. 90 tablet 3   rosuvastatin (CRESTOR) 20 MG tablet Take 1 tablet (20 mg total) by mouth daily. 90 tablet 3   potassium chloride (KLOR-CON M) 10 MEQ tablet Take 4 tablets (40 mEq total) by mouth daily. (Patient not taking: Reported on 01/13/2024) 200 tablet 3   blood glucose meter  kit and supplies KIT Dispense based on patient and insurance preference. Use up to four times daily as directed. (Patient not taking: Reported on 01/13/2024) 1 each 0   No facility-administered medications prior to visit.    No Known Allergies     Objective:    BP 117/75 (BP Location: Right Arm, Patient Position: Sitting, Cuff Size: Normal)   Pulse 82   Ht 5\' 10"  (1.778 m)   Wt 196 lb 6.4 oz (89.1 kg)   BMI 28.18 kg/m  Wt Readings from Last 3 Encounters:  01/13/24 196 lb 6.4 oz (89.1 kg)  10/07/23 192 lb 9.6 oz (87.4 kg)  09/16/23 190 lb 12.8 oz (86.5 kg)    Physical Exam Vitals and nursing note reviewed.  Constitutional:      Appearance: He is well-developed.  HENT:     Head: Normocephalic and atraumatic.  Cardiovascular:     Rate and Rhythm: Normal rate and regular rhythm.     Heart sounds: Normal heart sounds. No murmur heard.    No friction rub. No gallop.  Pulmonary:     Effort: Pulmonary effort is normal. No tachypnea or respiratory distress.     Breath sounds: Normal breath sounds. No decreased breath sounds, wheezing, rhonchi or rales.  Chest:     Chest wall: No tenderness.  Abdominal:     General: Bowel sounds are normal.     Palpations: Abdomen is soft.  Musculoskeletal:        General: Normal range of motion.     Cervical back: Normal range of motion.  Skin:    General: Skin is warm and dry.  Neurological:     Mental Status: He is alert and oriented to person, place, and time.     Coordination: Coordination normal.  Psychiatric:        Behavior: Behavior normal. Behavior is cooperative.        Thought Content: Thought content normal.        Judgment: Judgment normal.          Patient has been counseled extensively about nutrition and exercise as well as the importance of adherence with medications and regular follow-up. The patient was given clear instructions to go to ER or return to medical center if symptoms don't improve, worsen or new problems  develop. The patient verbalized understanding.   Follow-up: Return in about 3 months (around 04/11/2024).   Claiborne Rigg, FNP-BC [facility] and [facility] [place], [place] [phone]   01/13/2024, 11:28 AM

## 2024-01-13 NOTE — Patient Instructions (Signed)
Check blood sugars twice per day  Before you eat your blood sugars should be 90-130  1-2 hours after you eat your blood sugars should be less than 180

## 2024-01-14 ENCOUNTER — Other Ambulatory Visit: Payer: Self-pay | Admitting: Nurse Practitioner

## 2024-01-14 DIAGNOSIS — N1832 Chronic kidney disease, stage 3b: Secondary | ICD-10-CM

## 2024-01-14 DIAGNOSIS — R972 Elevated prostate specific antigen [PSA]: Secondary | ICD-10-CM

## 2024-01-14 LAB — CMP14+EGFR
ALT: 27 [IU]/L (ref 0–44)
AST: 21 [IU]/L (ref 0–40)
Albumin: 4.5 g/dL (ref 3.9–4.9)
Alkaline Phosphatase: 121 [IU]/L (ref 44–121)
BUN/Creatinine Ratio: 24 (ref 10–24)
BUN: 47 mg/dL — ABNORMAL HIGH (ref 8–27)
Bilirubin Total: 0.6 mg/dL (ref 0.0–1.2)
CO2: 26 mmol/L (ref 20–29)
Calcium: 9.8 mg/dL (ref 8.6–10.2)
Chloride: 97 mmol/L (ref 96–106)
Creatinine, Ser: 1.99 mg/dL — ABNORMAL HIGH (ref 0.76–1.27)
Globulin, Total: 3.4 g/dL (ref 1.5–4.5)
Glucose: 383 mg/dL — ABNORMAL HIGH (ref 70–99)
Potassium: 4.7 mmol/L (ref 3.5–5.2)
Sodium: 140 mmol/L (ref 134–144)
Total Protein: 7.9 g/dL (ref 6.0–8.5)
eGFR: 37 mL/min/{1.73_m2} — ABNORMAL LOW (ref >59)

## 2024-01-19 ENCOUNTER — Telehealth (HOSPITAL_COMMUNITY): Payer: Self-pay

## 2024-01-19 NOTE — Telephone Encounter (Signed)
 Called to confirm/remind patient of their appointment at the [facility] on 01/20/24. However, was unable to leave patient a voice message.

## 2024-01-20 ENCOUNTER — Other Ambulatory Visit: Payer: Self-pay

## 2024-01-20 ENCOUNTER — Encounter (HOSPITAL_COMMUNITY): Payer: Self-pay

## 2024-02-06 ENCOUNTER — Other Ambulatory Visit (HOSPITAL_COMMUNITY): Payer: Self-pay | Admitting: Internal Medicine

## 2024-02-09 ENCOUNTER — Telehealth (HOSPITAL_COMMUNITY): Payer: Self-pay

## 2024-02-09 NOTE — Telephone Encounter (Signed)
 Called to confirm/remind patient of their appointment at the [facility] on 02/09/23. However, was unable to leave patient a voice message.

## 2024-02-10 ENCOUNTER — Encounter (HOSPITAL_COMMUNITY): Payer: Self-pay

## 2024-02-17 ENCOUNTER — Ambulatory Visit: Payer: Self-pay | Admitting: Pharmacist

## 2024-02-24 ENCOUNTER — Other Ambulatory Visit: Payer: Self-pay

## 2024-02-24 ENCOUNTER — Other Ambulatory Visit (HOSPITAL_COMMUNITY): Payer: Self-pay

## 2024-03-12 NOTE — Progress Notes (Signed)
 Advanced Heart Failure Clinic Note    PCP: Claiborne Rigg, NP HF Cardiologist: Dr. Gala Romney   Reason for Visit: f/u for biventricular heart failure   HPI: Kurt Woodard is a 63 y.o. male with a h/o HTN, CKD 3b (Scr 1.8), DVT, and systolic CHF.    Found to have DVT in 2018. He then developed fluid overload and had echo at OSH in 2019 and was told heart was operating at 20%. Underwent diuresis and felt better.  Did not have cath or repeat u/s. Says he got scared and decided not to f/u.    Seen 8/20 in HF Clinic for one visit. Bedside echo showed EF 20% and RV moderately HK. Reported only NYHA II symptoms, but was markedly tachycardic. Advised on need for R/L cath but he never followed up. DVT felt to be likely provoked in setting of immobility when he first had HF.  Admitted 5/22 with a/c systolic heart failure and acute RLL segmental PE and large rt pleural effusion requiring thoracentesis. LE doppler negative for PE. Repeat echo showed EF 10-15% w/ RV dilated  and severely HK. R/LHC 5/27 with minimal CAD and severe NICM, mildly elevated filling pressures and relatively preserved CO, 5.4, CI 2.5. cMRI was recommended but unable to get completed inpatient. D/c wt 211 lb.   Echo 10/22 EF ~25% w/ global HK. RV mildly enlarged and mildly reduced. Moderate MR noted w/ restricted posterior leaflet motion. Mild TR.   Echo 04/15/23 showed EF 10-15%, mild LVH, RV moderately down, RVSP 69.2 mmHg, mild to moderate MR  04/2023 -HgbA1C >15.5.   He was seen in 6/24 and off all his HF meds. Meds restarted.   Today he returns for HF follow up. Here for routine f/u. Feels great. Continues to Counsellor. Denies CP, SOB or edema. Still not checking blood sugars regularly. Has not received Entresto    Cardiac Studies - Echo (5/24): EF 10-15%, mild LVH, RV moderately down, RVSP 69.2 mmHg, mild to moderate MR  - Echo (10/22): EF 25%, RV mildly reduced, Mod MR w/ restricted posterior leaflet motion.     - Echo (5/22): EF 10-15%, RV severely reduced, Mod MR, Mod-severe TR  - R/LHC (5/22) RPDA lesion is 50% stenosed. 1st Diag lesion is 20% stenosed. Dist LAD lesion is 20% stenosed.   Ao = 108/71 (88) LV = 112/22 RA =  10 RV = 59/14 PA = 57/22 (37) PCW = 14 Fick cardiac output/index = 5.4/2.5 PVR = 4.3 WU SVR = 1166 Ao sat = 99% PA sat = 71%, 72%   1. Minimal CAD 2. Severe NICM  3. Mild PAH 4. Mildly elevated filling pressure with relatively preserved CO   ROS: All systems reviewed and negative except as per HPI.   Past Medical History:  Diagnosis Date   CHF (congestive heart failure) (HCC)    Chronic congestive heart failure (HCC) 08/27/2019   Renal disorder    kidney stones   Current Outpatient Medications  Medication Sig Dispense Refill   apixaban (ELIQUIS) 5 MG TABS tablet Take 1 tablet (5 mg total) by mouth 2 (two) times daily. 60 tablet 11   Blood Glucose Monitoring Suppl (ACCU-CHEK GUIDE) w/Device KIT Use as directed. 1 kit 0   carvedilol (COREG) 3.125 MG tablet Take 1 tablet (3.125 mg total) by mouth 2 (two) times daily. 180 tablet 3   empagliflozin (JARDIANCE) 10 MG TABS tablet Take one tablet by mouth daily. 90 tablet 3   empagliflozin (JARDIANCE) 25 MG  TABS tablet Take 1 tablet (25 mg total) by mouth daily. 90 tablet 1   furosemide (LASIX) 40 MG tablet TAKE ONE TABLET BY MOUTH TWO TIMES DAILY 90 tablet 3   insulin glargine (LANTUS) 100 UNIT/ML Solostar Pen Inject 20 Units into the skin daily at 10 pm. 15 mL 11   losartan (COZAAR) 25 MG tablet Take 1 tablet (25 mg total) by mouth daily. 90 tablet 3   potassium chloride (KLOR-CON M) 10 MEQ tablet Take 4 tablets (40 mEq total) by mouth daily. (Patient not taking: Reported on 01/13/2024) 200 tablet 3   rosuvastatin (CRESTOR) 20 MG tablet Take 1 tablet (20 mg total) by mouth daily. 90 tablet 3   No current facility-administered medications for this visit.   No Known Allergies  Social History   Socioeconomic  History   Marital status: Single    Spouse name: Not on file   Number of children: Not on file   Years of education: Not on file   Highest education level: Not on file  Occupational History   Not on file  Tobacco Use   Smoking status: Never   Smokeless tobacco: Never  Vaping Use   Vaping status: Never Used  Substance and Sexual Activity   Alcohol use: Not Currently   Drug use: Never   Sexual activity: Not on file  Other Topics Concern   Not on file  Social History Narrative   Not on file   Social Drivers of Health   Financial Resource Strain: Low Risk  (10/07/2023)   Overall Financial Resource Strain (CARDIA)    Difficulty of Paying Living Expenses: Not hard at all  Food Insecurity: No Food Insecurity (10/07/2023)   Hunger Vital Sign    Worried About Running Out of Food in the Last Year: Never true    Ran Out of Food in the Last Year: Never true  Transportation Needs: No Transportation Needs (10/07/2023)   PRAPARE - Administrator, Civil Service (Medical): No    Lack of Transportation (Non-Medical): No  Physical Activity: Sufficiently Active (10/07/2023)   Exercise Vital Sign    Days of Exercise per Week: 5 days    Minutes of Exercise per Session: 60 min  Recent Concern: Physical Activity - Inactive (10/07/2023)   Exercise Vital Sign    Days of Exercise per Week: 0 days    Minutes of Exercise per Session: 0 min  Stress: No Stress Concern Present (10/07/2023)   [employer] of Occupational Health - Occupational Stress Questionnaire    Feeling of Stress : Not at all  Social Connections: Unknown (10/07/2023)   Social Connection and Isolation Panel [NHANES]    Frequency of Communication with Friends and Family: More than three times a week    Frequency of Social Gatherings with Friends and Family: More than three times a week    Attends Religious Services: More than 4 times per year    Active Member of [employer] or Organizations: No    Attends Museum/gallery exhibitions officer: More than 4 times per year    Marital Status: Patient declined  Intimate Partner Violence: Not At Risk (10/07/2023)   Humiliation, Afraid, Rape, and Kick questionnaire    Fear of Current or Ex-Partner: No    Emotionally Abused: No    Physically Abused: No    Sexually Abused: No   No family history on file.  There were no vitals taken for this visit.  Wt Readings from Last 3 Encounters:  01/13/24 89.1 kg (196 lb 6.4 oz)  10/07/23 87.4 kg (192 lb 9.6 oz)  09/16/23 86.5 kg (190 lb 12.8 oz)   PHYSICAL EXAM: General:  Well appearing. No resp difficulty HEENT: normal Neck: supple. no JVD. Carotids 2+ bilat; no bruits. No lymphadenopathy or thryomegaly appreciated. Cor: Regular rate & rhythm. No rubs, gallops or murmurs. Lungs: clear Abdomen: soft, nontender, nondistended. No hepatosplenomegaly. No bruits or masses. Good bowel sounds. Extremities: no cyanosis, clubbing, rash, edema Neuro: alert & orientedx3, cranial nerves grossly intact. moves all 4 extremities w/o difficulty. Affect pleasant   ASSESSMENT & PLAN:  1. Chronic Biventricular Heart Failure  - Due to NICM. Unclear etiology.  - Echo (5/22): EF 15% RV severely HK - cath (5/22): with minimal CAD. Severe NICM. Hemodynamics much better than expected. Fick 5.4/2.5 - has not had cMRI. Uninsured.  - Echo (10/22): EF ~25%, RV mildly reduced.  - Echo 04/15/23 showed EF 10-15%, mild LVH, RV moderately down, RSVP 69.2 mmHg, mild to moderate MR - NYHA I-II. Volume stable. Continue lasix 40 mg twice a day.  - Will check with PharmD about Entresto 24/26 mg bid. If not able to get it will start losartan 25.  - Continue  Jardiance 10 mg daily.  - Continue coreg 3.125 mg twice a dat  - Doing well despite severely depressed EF. Unfortunately not a candidate for advanced therapies due to uncontrolled DM2, compliance issues and lack of insurance so will not consider CPX at this time.  - I discussed with PharmD team  will use HF Fund to get him losartan and crestor - Would add spiro next  2. Hypertension  - BP well controlled   3. H/o RLL Subsegmental PE - Diagnosed 5/22, LE Dopplers negative for DVT  - Off Eliquis now    4. 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  5. CAD - mild by cath (5/22). - No s/s angina - Will start crestor 20   6. Type 2 DM - Hgb A1c  04/2023 > 15.5 - On jardiance. He does not want to use insulin because he doesn't want inject with a needed.  - He has a follow up with [employer]. Needs Endocrinology consultation.  - Start crestor 20   7. SDOH - uninsured. HFSW has seen him but he still hasn't applied for Medicaid as instructed - Meds thru HF fund.   [name] Malta [place], FNP 03/12/24

## 2024-03-15 ENCOUNTER — Other Ambulatory Visit (HOSPITAL_COMMUNITY): Payer: Self-pay

## 2024-03-15 ENCOUNTER — Telehealth (HOSPITAL_COMMUNITY): Payer: Self-pay

## 2024-03-15 ENCOUNTER — Other Ambulatory Visit: Payer: Self-pay

## 2024-03-15 NOTE — Telephone Encounter (Signed)
 Called to confirm/remind patient of their appointment at the [facility] on 03/16/24.   Appointment:   [] Confirmed  [] Left mess   [x] No answer/No voice mail  [] Phone not in service

## 2024-03-16 ENCOUNTER — Encounter (HOSPITAL_COMMUNITY): Payer: Self-pay

## 2024-03-16 ENCOUNTER — Telehealth (HOSPITAL_COMMUNITY): Payer: Self-pay | Admitting: *Deleted

## 2024-03-16 ENCOUNTER — Other Ambulatory Visit (HOSPITAL_COMMUNITY): Payer: Self-pay

## 2024-03-16 ENCOUNTER — Ambulatory Visit (HOSPITAL_COMMUNITY)
Admission: RE | Admit: 2024-03-16 | Discharge: 2024-03-16 | Disposition: A | Discharge status: Home / Self Care | Payer: Self-pay | Source: Ambulatory Visit | Attending: Family Medicine | Admitting: Family Medicine

## 2024-03-16 VITALS — BP 144/96 | HR 90 | Wt 198.2 lb

## 2024-03-16 DIAGNOSIS — Z7901 Long term (current) use of anticoagulants: Secondary | ICD-10-CM | POA: Insufficient documentation

## 2024-03-16 DIAGNOSIS — E1165 Type 2 diabetes mellitus with hyperglycemia: Secondary | ICD-10-CM

## 2024-03-16 DIAGNOSIS — Z86718 Personal history of other venous thrombosis and embolism: Secondary | ICD-10-CM | POA: Insufficient documentation

## 2024-03-16 DIAGNOSIS — E1122 Type 2 diabetes mellitus with diabetic chronic kidney disease: Secondary | ICD-10-CM | POA: Insufficient documentation

## 2024-03-16 DIAGNOSIS — Z86711 Personal history of pulmonary embolism: Secondary | ICD-10-CM | POA: Insufficient documentation

## 2024-03-16 DIAGNOSIS — I428 Other cardiomyopathies: Secondary | ICD-10-CM | POA: Insufficient documentation

## 2024-03-16 DIAGNOSIS — Z7985 Long-term (current) use of injectable non-insulin antidiabetic drugs: Secondary | ICD-10-CM | POA: Insufficient documentation

## 2024-03-16 DIAGNOSIS — I130 Hypertensive heart and chronic kidney disease with heart failure and stage 1 through stage 4 chronic kidney disease, or unspecified chronic kidney disease: Secondary | ICD-10-CM | POA: Insufficient documentation

## 2024-03-16 DIAGNOSIS — Z794 Long term (current) use of insulin: Secondary | ICD-10-CM | POA: Insufficient documentation

## 2024-03-16 DIAGNOSIS — I5022 Chronic systolic (congestive) heart failure: Secondary | ICD-10-CM | POA: Insufficient documentation

## 2024-03-16 DIAGNOSIS — Z79899 Other long term (current) drug therapy: Secondary | ICD-10-CM | POA: Insufficient documentation

## 2024-03-16 DIAGNOSIS — I2510 Atherosclerotic heart disease of native coronary artery without angina pectoris: Secondary | ICD-10-CM | POA: Insufficient documentation

## 2024-03-16 DIAGNOSIS — I5082 Biventricular heart failure: Secondary | ICD-10-CM | POA: Insufficient documentation

## 2024-03-16 DIAGNOSIS — Z7984 Long term (current) use of oral hypoglycemic drugs: Secondary | ICD-10-CM | POA: Insufficient documentation

## 2024-03-16 DIAGNOSIS — N1832 Chronic kidney disease, stage 3b: Secondary | ICD-10-CM | POA: Insufficient documentation

## 2024-03-16 DIAGNOSIS — I10 Essential (primary) hypertension: Secondary | ICD-10-CM

## 2024-03-16 DIAGNOSIS — I513 Intracardiac thrombosis, not elsewhere classified: Secondary | ICD-10-CM | POA: Insufficient documentation

## 2024-03-16 DIAGNOSIS — Z139 Encounter for screening, unspecified: Secondary | ICD-10-CM

## 2024-03-16 DIAGNOSIS — Z5971 Insufficient health insurance coverage: Secondary | ICD-10-CM | POA: Insufficient documentation

## 2024-03-16 LAB — CBC
HCT: 44.4 % (ref 39.0–52.0)
Hemoglobin: 15.7 g/dL (ref 13.0–17.0)
MCH: 30.9 pg (ref 26.0–34.0)
MCHC: 35.4 g/dL (ref 30.0–36.0)
MCV: 87.4 fL (ref 80.0–100.0)
Platelets: 153 10*3/uL (ref 150–400)
RBC: 5.08 MIL/uL (ref 4.22–5.81)
RDW: 12.9 % (ref 11.5–15.5)
WBC: 8.8 10*3/uL (ref 4.0–10.5)
nRBC: 0 % (ref 0.0–0.2)

## 2024-03-16 LAB — COMPREHENSIVE METABOLIC PANEL WITH GFR
ALT: 24 U/L (ref 0–44)
AST: 16 U/L (ref 15–41)
Albumin: 4.1 g/dL (ref 3.5–5.0)
Alkaline Phosphatase: 88 U/L (ref 38–126)
Anion gap: 14 (ref 5–15)
BUN: 39 mg/dL — ABNORMAL HIGH (ref 8–23)
CO2: 23 mmol/L (ref 22–32)
Calcium: 9.4 mg/dL (ref 8.9–10.3)
Chloride: 99 mmol/L (ref 98–111)
Creatinine, Ser: 1.82 mg/dL — ABNORMAL HIGH (ref 0.61–1.24)
GFR, Estimated: 41 mL/min — ABNORMAL LOW (ref >60)
Glucose, Bld: 506 mg/dL (ref 70–99)
Potassium: 3.7 mmol/L (ref 3.5–5.1)
Sodium: 136 mmol/L (ref 135–145)
Total Bilirubin: 0.9 mg/dL (ref 0.0–1.2)
Total Protein: 7.8 g/dL (ref 6.5–8.1)

## 2024-03-16 LAB — LIPID PANEL
Cholesterol: 199 mg/dL (ref 0–200)
HDL: 42 mg/dL (ref >40)
LDL Cholesterol: 118 mg/dL — ABNORMAL HIGH (ref 0–99)
Total CHOL/HDL Ratio: 4.7 ratio
Triglycerides: 194 mg/dL — ABNORMAL HIGH (ref <150)
VLDL: 39 mg/dL (ref 0–40)

## 2024-03-16 LAB — BRAIN NATRIURETIC PEPTIDE: B Natriuretic Peptide: 150.8 pg/mL — ABNORMAL HIGH (ref 0.0–100.0)

## 2024-03-16 MED ORDER — SPIRONOLACTONE 25 MG PO TABS
12.5000 mg | ORAL_TABLET | Freq: Every day | ORAL | 3 refills | Status: DC
  Filled 2024-03-16 (×2): qty 45, 90d supply, fill #0

## 2024-03-16 MED ORDER — FUROSEMIDE 40 MG PO TABS: 40.0000 mg | ORAL_TABLET | Freq: Two times a day (BID) | ORAL | 3 refills | Status: DC

## 2024-03-16 NOTE — Telephone Encounter (Signed)
 Attempted to call patient per Prince Rome, NP with elevated glucose. No answer and unable to leave message.  Called son and informed him of elevated glucose level of 506. Prince Rome, NP is instructing patient to go to Emergency Department for same. If he won't go to ED, at least re-check to make sure it is trending down. Son verbalized understanding of same and will get in touch with his father. Asked them to call us at [phone] option [phone] if any questions or concerns.

## 2024-03-16 NOTE — Patient Instructions (Addendum)
 Start Spiro 12.5 (1/2 tablet) daily - Rx sent to [facility]. Labs today - will call you if abnormal. Repeat labs in one week - see below. Return to see Dr. Gala Romney in 4 months. Please call us at [phone] in July to schedule this appointment.  Please call us at [phone] if any questions or concerns prior to your next visit.

## 2024-03-16 NOTE — Addendum Note (Signed)
 Encounter addended by: Levonne Spiller, RN on: 03/16/2024 12:52 PM  Actions taken: Charge Capture section accepted

## 2024-03-23 ENCOUNTER — Other Ambulatory Visit (HOSPITAL_COMMUNITY): Payer: Self-pay

## 2024-04-12 ENCOUNTER — Telehealth (HOSPITAL_COMMUNITY): Payer: Self-pay

## 2024-04-12 ENCOUNTER — Other Ambulatory Visit (HOSPITAL_COMMUNITY): Payer: Self-pay

## 2024-04-12 ENCOUNTER — Other Ambulatory Visit: Payer: Self-pay

## 2024-04-12 NOTE — Telephone Encounter (Signed)
 Advanced Heart Failure Patient Advocate Encounter  Application for Jardiance  faxed to [facility] on 04/12/2024. Application form attached to patient chart.  Kennis Peacock, CPhT Rx Patient Advocate Phone: [phone]

## 2024-04-18 ENCOUNTER — Other Ambulatory Visit (HOSPITAL_COMMUNITY): Payer: Self-pay

## 2024-04-18 ENCOUNTER — Other Ambulatory Visit: Payer: Self-pay

## 2024-04-19 ENCOUNTER — Other Ambulatory Visit: Payer: Self-pay

## 2024-04-19 NOTE — Telephone Encounter (Signed)
 Contacted [employer] to check on status of application. Application has not yet been processed. [employer] is currently taking 2-3 weeks for determinations. Will continue to follow up.

## 2024-04-23 ENCOUNTER — Other Ambulatory Visit: Payer: Self-pay

## 2024-04-27 ENCOUNTER — Other Ambulatory Visit (HOSPITAL_COMMUNITY): Payer: Self-pay | Admitting: Cardiology

## 2024-04-27 DIAGNOSIS — I5022 Chronic systolic (congestive) heart failure: Secondary | ICD-10-CM

## 2024-04-30 NOTE — Telephone Encounter (Signed)
 Contacted [employer] to check on status of application. Representative does not show that forms from 5/1 were received. Unsure if this is due to the delay in [facility] Cares determinations, or if fax was not received at all. Refaxed application 05/19.

## 2024-05-11 ENCOUNTER — Other Ambulatory Visit: Payer: Self-pay

## 2024-05-11 ENCOUNTER — Other Ambulatory Visit (HOSPITAL_COMMUNITY)
Admission: RE | Admit: 2024-05-11 | Discharge: 2024-05-11 | Disposition: A | Discharge status: Home / Self Care | Payer: Self-pay | Source: Ambulatory Visit | Attending: Nurse Practitioner | Admitting: Nurse Practitioner

## 2024-05-11 ENCOUNTER — Encounter: Payer: Self-pay | Admitting: Nurse Practitioner

## 2024-05-11 ENCOUNTER — Telehealth: Payer: Self-pay

## 2024-05-11 ENCOUNTER — Ambulatory Visit: Payer: Self-pay | Attending: Nurse Practitioner | Admitting: Nurse Practitioner

## 2024-05-11 VITALS — BP 112/77 | HR 86 | Resp 19 | Ht 70.0 in | Wt 200.6 lb

## 2024-05-11 DIAGNOSIS — R972 Elevated prostate specific antigen [PSA]: Secondary | ICD-10-CM

## 2024-05-11 DIAGNOSIS — Z7984 Long term (current) use of oral hypoglycemic drugs: Secondary | ICD-10-CM

## 2024-05-11 DIAGNOSIS — E119 Type 2 diabetes mellitus without complications: Secondary | ICD-10-CM

## 2024-05-11 DIAGNOSIS — I10 Essential (primary) hypertension: Secondary | ICD-10-CM

## 2024-05-11 DIAGNOSIS — Z23 Encounter for immunization: Secondary | ICD-10-CM

## 2024-05-11 DIAGNOSIS — E7800 Pure hypercholesterolemia, unspecified: Secondary | ICD-10-CM

## 2024-05-11 DIAGNOSIS — Z794 Long term (current) use of insulin: Secondary | ICD-10-CM

## 2024-05-11 LAB — POCT GLYCOSYLATED HEMOGLOBIN (HGB A1C): HbA1c POC (<> result, manual entry): >15 % (ref 4.0–5.6)

## 2024-05-11 MED ORDER — TRUE METRIX BLOOD GLUCOSE TEST VI STRP
ORAL_STRIP | 6 refills | Status: AC
  Filled 2024-05-11: qty 100, 33d supply, fill #0

## 2024-05-11 MED ORDER — TRUE METRIX METER W/DEVICE KIT
PACK | 0 refills | Status: AC
  Filled 2024-05-11: qty 1, 365d supply, fill #0

## 2024-05-11 MED ORDER — APIXABAN 5 MG PO TABS
5.0000 mg | ORAL_TABLET | Freq: Two times a day (BID) | ORAL | 11 refills | Status: DC
  Filled 2024-05-11: qty 60, 30d supply, fill #0

## 2024-05-11 MED ORDER — TRUEPLUS LANCETS 28G MISC
3 refills | Status: AC
  Filled 2024-05-11: qty 100, 33d supply, fill #0

## 2024-05-11 MED ORDER — EMPAGLIFLOZIN 25 MG PO TABS
25.0000 mg | ORAL_TABLET | Freq: Every day | ORAL | 3 refills | Status: AC
Start: 2024-05-11 — End: ?

## 2024-05-11 NOTE — Progress Notes (Signed)
 Assessment & Plan:   [name] was seen today for diabetes.  Diagnoses and all orders for this visit:  Primary hypertension -     CMP14+EGFR  Type 2 diabetes mellitus with insulin  therapy (HCC) -     POCT glycosylated hemoglobin (Hb A1C) -     CMP14+EGFR -     empagliflozin  (JARDIANCE ) 25 MG TABS tablet; Take 1 tablet (25 mg total) by mouth daily. -     Blood Glucose Monitoring Suppl (TRUE METRIX METER) w/Device KIT; Use as instructed. Check blood glucose level by fingerstick three times per day. E11.65 -     glucose blood (TRUE METRIX BLOOD GLUCOSE TEST) test strip; Use as instructed. Check blood glucose level by fingerstick three times per day. E11.65 -     TRUEplus Lancets 28G MISC; Use as instructed. Check blood glucose level by fingerstick three times per day. E11.65  Need for shingles vaccine -     Varicella-zoster vaccine IM  Hypercholesteremia -     Lipid panel  Elevated PSA -     PSA -     Urine cytology ancillary only -     Urinalysis, Complete  Other orders -     apixaban  (ELIQUIS ) 5 MG TABS tablet; Take 1 tablet (5 mg total) by mouth 2 (two) times daily.    Patient has been counseled on age-appropriate routine health concerns for screening and prevention. These are reviewed and up-to-date. Referrals have been placed accordingly. Immunizations are up-to-date or declined.    Subjective:   Chief Complaint  Patient presents with   Diabetes    Kurt Woodard 63 y.o. 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 We were able to have him reapply for patient assistance through our pharmacy for this.    DM 2 Currently taking jardiance  10 mg daily and . Diabetes poorly controlled. He states he does not have a meter, strips or lancets. Currently prescribed jardiance  25  mg daily> will need to add insulin  today. He adamantly declines metformin  due to side effects he has read about. He was only recently able to get his insulin .  We will work on patient assistance for the Jardiance  as well today. Lab Results  Component Value Date   HGBA1C >15 05/11/2024    Lab Results  Component Value Date   HGBA1C 14.5 (A) 01/13/2024      HTN Blood pressure is well-controlled.  He is currently prescribed losartan  25 mg daily (HE IS NOT TAKING), spironolactone  12.5 mg daily, carvedilol  3.125 mg twice daily as well as furosemide  40 mg daily BP Readings from Last 3 Encounters:  05/11/24 112/77  03/16/24 (!) 144/96  01/13/24 117/75     He has a history of elevated PSA with lack of follow-up to urology.  I did instruct him that his PSA was elevated and we will need to repeat his labs today and if continues elevated will need to follow-up with urology urgently He works out of town in [place] and getting in touch with him is very difficult with regard to lab results.   Review of Systems  Constitutional:  Negative for fever, malaise/fatigue and weight loss.  HENT: Negative.  Negative for nosebleeds.   Eyes: Negative.  Negative for blurred vision, double vision  and photophobia.  Respiratory: Negative.  Negative for cough and shortness of breath.   Cardiovascular: Negative.  Negative for chest pain, palpitations and leg swelling.  Gastrointestinal: Negative.  Negative for heartburn, nausea and vomiting.  Musculoskeletal: Negative.  Negative for myalgias.  Neurological: Negative.  Negative for dizziness, focal weakness, seizures and headaches.  Psychiatric/Behavioral: Negative.  Negative for suicidal ideas.     Past Medical History:  Diagnosis Date   CHF (congestive heart failure) (HCC)    Chronic congestive heart failure (HCC) 08/27/2019   Renal disorder    kidney stones    Past Surgical History:  Procedure Laterality Date   RIGHT/LEFT HEART CATH AND CORONARY  ANGIOGRAPHY N/A 05/08/2021   Procedure: RIGHT/LEFT HEART CATH AND CORONARY ANGIOGRAPHY;  Surgeon: Mardell Shade, MD;  Location: [facility];  Service: Cardiovascular;  Laterality: N/A;    No family history on file.  Social History Reviewed with no changes to be made today.   Outpatient Medications Prior to Visit  Medication Sig Dispense Refill   Blood Glucose Monitoring Suppl (ACCU-CHEK GUIDE) w/Device KIT Use as directed. 1 kit 0   carvedilol  (COREG ) 3.125 MG tablet Take 1 tablet (3.125 mg total) by mouth 2 (two) times daily. 180 tablet 3   furosemide  (LASIX ) 40 MG tablet TAKE ONE TABLET BY MOUTH TWO TIMES DAILY 180 tablet 3   insulin  glargine (LANTUS ) 100 UNIT/ML Solostar Pen Inject 20 Units into the skin daily at 10 pm. 15 mL 11   rosuvastatin  (CRESTOR ) 20 MG tablet Take 1 tablet (20 mg total) by mouth daily. 90 tablet 3   spironolactone  (ALDACTONE ) 25 MG tablet Take 1/2 tablet (12.5 mg total) by mouth daily. 45 tablet 3   apixaban  (ELIQUIS ) 5 MG TABS tablet Take 1 tablet (5 mg total) by mouth 2 (two) times daily. 60 tablet 11   empagliflozin  (JARDIANCE ) 10 MG TABS tablet Take one tablet by mouth daily. 90 tablet 3   losartan  (COZAAR ) 25 MG tablet Take 1 tablet (25 mg total) by mouth daily. (Patient not taking: Reported on 05/11/2024) 90 tablet 3   No facility-administered medications prior to visit.    No Known Allergies     Objective:    BP 112/77 (BP Location: Left Arm, Patient Position: Sitting, Cuff Size: Normal)   Pulse 86   Resp 19   Ht 5\' 10"  (1.778 m)   Wt 200 lb 9.6 oz (91 kg)   SpO2 99%   BMI 28.78 kg/m  Wt Readings from Last 3 Encounters:  05/11/24 200 lb 9.6 oz (91 kg)  03/16/24 198 lb 3.2 oz (89.9 kg)  01/13/24 196 lb 6.4 oz (89.1 kg)    Physical Exam Vitals and nursing note reviewed.  Constitutional:      Appearance: He is well-developed.  HENT:     Head: Normocephalic and atraumatic.  Cardiovascular:     Rate and Rhythm: Normal rate and  regular rhythm.     Heart sounds: Normal heart sounds. No murmur heard.    No friction rub. No gallop.  Pulmonary:     Effort: Pulmonary effort is normal. No tachypnea or respiratory distress.     Breath sounds: Normal breath sounds. No decreased breath sounds, wheezing, rhonchi or rales.  Chest:     Chest wall: No tenderness.  Abdominal:     General: Bowel sounds are normal.     Palpations: Abdomen is soft.  Musculoskeletal:        General: Normal range of motion.  Cervical back: Normal range of motion.  Skin:    General: Skin is warm and dry.  Neurological:     Mental Status: He is alert and oriented to person, place, and time.     Coordination: Coordination normal.  Psychiatric:        Behavior: Behavior normal. Behavior is cooperative.        Thought Content: Thought content normal.        Judgment: Judgment normal.          Patient has been counseled extensively about nutrition and exercise as well as the importance of adherence with medications and regular follow-up. The patient was given clear instructions to go to ER or return to medical center if symptoms don't improve, worsen or new problems develop. The patient verbalized understanding.   Follow-up: Return in about 4 months (around 09/11/2024).   Collins Dean, FNP-BC [facility] [place], [place] [phone]   05/11/2024, 11:58 AM

## 2024-05-11 NOTE — Telephone Encounter (Signed)
 Submitted application for JARDIANCE  to [employer]) for patient assistance.   Phone: [phone]  Submitted application for ELIQUIS  to [facility] [employer]) for patient assistance.   Phone: [phone]

## 2024-05-12 LAB — URINALYSIS, COMPLETE
Bilirubin, UA: NEGATIVE
Ketones, UA: NEGATIVE
Leukocytes,UA: NEGATIVE
Nitrite, UA: NEGATIVE
Protein,UA: NEGATIVE
RBC, UA: NEGATIVE
Specific Gravity, UA: 1.016 (ref 1.005–1.030)
Urobilinogen, Ur: 0.2 mg/dL (ref 0.2–1.0)
pH, UA: 6.5 (ref 5.0–7.5)

## 2024-05-12 LAB — CMP14+EGFR
ALT: 24 IU/L (ref 0–44)
AST: 19 IU/L (ref 0–40)
Albumin: 4.7 g/dL (ref 3.9–4.9)
Alkaline Phosphatase: 122 IU/L — ABNORMAL HIGH (ref 44–121)
BUN/Creatinine Ratio: 21 (ref 10–24)
BUN: 38 mg/dL — ABNORMAL HIGH (ref 8–27)
Bilirubin Total: 0.7 mg/dL (ref 0.0–1.2)
CO2: 23 mmol/L (ref 20–29)
Calcium: 9.7 mg/dL (ref 8.6–10.2)
Chloride: 95 mmol/L — ABNORMAL LOW (ref 96–106)
Creatinine, Ser: 1.78 mg/dL — ABNORMAL HIGH (ref 0.76–1.27)
Globulin, Total: 3.5 g/dL (ref 1.5–4.5)
Glucose: 395 mg/dL — ABNORMAL HIGH (ref 70–99)
Potassium: 4.1 mmol/L (ref 3.5–5.2)
Sodium: 137 mmol/L (ref 134–144)
Total Protein: 8.2 g/dL (ref 6.0–8.5)
eGFR: 43 mL/min/{1.73_m2} — ABNORMAL LOW (ref >59)

## 2024-05-12 LAB — LIPID PANEL
Chol/HDL Ratio: 4.2 ratio (ref 0.0–5.0)
Cholesterol, Total: 167 mg/dL (ref 100–199)
HDL: 40 mg/dL (ref >39)
LDL Chol Calc (NIH): 95 mg/dL (ref 0–99)
Triglycerides: 186 mg/dL — ABNORMAL HIGH (ref 0–149)
VLDL Cholesterol Cal: 32 mg/dL (ref 5–40)

## 2024-05-12 LAB — MICROSCOPIC EXAMINATION
Bacteria, UA: NONE SEEN
Casts: NONE SEEN /LPF
Epithelial Cells (non renal): NONE SEEN /HPF (ref 0–10)
RBC, Urine: NONE SEEN /HPF (ref 0–2)
WBC, UA: NONE SEEN /HPF (ref 0–5)

## 2024-05-12 LAB — PSA: Prostate Specific Ag, Serum: 12.4 ng/mL — ABNORMAL HIGH (ref 0.0–4.0)

## 2024-05-13 ENCOUNTER — Ambulatory Visit: Payer: Self-pay | Admitting: Nurse Practitioner

## 2024-05-14 LAB — URINE CYTOLOGY ANCILLARY ONLY
Chlamydia: NEGATIVE
Comment: NEGATIVE
Comment: NEGATIVE
Comment: NORMAL
Neisseria Gonorrhea: NEGATIVE
Trichomonas: NEGATIVE

## 2024-05-14 NOTE — Telephone Encounter (Signed)
 Patient was approved to receive Jardiance  from [facility] Effective 05/02/2024 to 05/02/2025

## 2024-05-16 ENCOUNTER — Other Ambulatory Visit: Payer: Self-pay

## 2024-05-16 ENCOUNTER — Telehealth: Payer: Self-pay

## 2024-05-16 NOTE — Telephone Encounter (Signed)
 Received notification from [employer]) regarding approval for JARDIANCE . Patient assistance approved from 6/3/205 to 05/15/2025.  Medication will ship to [address], [place] [address]  Pt ID: [id number]  Company phone: [phone]

## 2024-05-28 ENCOUNTER — Other Ambulatory Visit: Payer: Self-pay

## 2024-05-28 ENCOUNTER — Telehealth: Payer: Self-pay

## 2024-05-28 NOTE — Telephone Encounter (Signed)
 Received notification from [facility] [employer]) regarding approval for ELIQUIS . Patient assistance approved from 05/17/2024 to 05/16/2025.  Medication will ship to [address], [place], [place] [address]  Pt ID: [id number]  Company phone: [phone]

## 2024-06-07 ENCOUNTER — Other Ambulatory Visit (HOSPITAL_COMMUNITY): Payer: Self-pay | Admitting: Family Medicine

## 2024-06-08 ENCOUNTER — Other Ambulatory Visit (HOSPITAL_COMMUNITY): Payer: Self-pay

## 2024-06-11 ENCOUNTER — Other Ambulatory Visit (HOSPITAL_COMMUNITY): Payer: Self-pay

## 2024-06-11 MED ORDER — CARVEDILOL 3.125 MG PO TABS
3.1250 mg | ORAL_TABLET | Freq: Two times a day (BID) | ORAL | 3 refills | Status: DC
  Filled 2024-06-11: qty 60, 30d supply, fill #0
  Filled 2024-07-22: qty 60, 30d supply, fill #1

## 2024-07-23 ENCOUNTER — Other Ambulatory Visit: Payer: Self-pay

## 2024-07-23 ENCOUNTER — Other Ambulatory Visit (HOSPITAL_COMMUNITY): Payer: Self-pay

## 2024-07-26 ENCOUNTER — Other Ambulatory Visit: Payer: Self-pay

## 2024-08-17 ENCOUNTER — Other Ambulatory Visit: Payer: Self-pay

## 2024-08-22 NOTE — Progress Notes (Signed)
 Advanced [facility] Clinic Note    PCP: Theotis Haze ORN, NP HF Cardiologist: Dr. Cherrie   HPI: [name] is a 63 y.o. male with a h/o HTN, CKD 3b (Scr 1.8), DVT, and systolic CHF.    Found to have DVT in 2018. He then developed fluid overload and had echo at OSH in 2019 and was told heart was operating at 20%. Underwent diuresis and felt better.  Did not have cath or repeat u/s. Says he got scared and decided not to f/u.    Seen 8/20 in HF Clinic for one visit. Bedside echo showed EF 20% and RV moderately HK. Reported only NYHA II symptoms, but was markedly tachycardic. Advised on need for R/L cath but he never followed up. DVT felt to be likely provoked in setting of immobility when he first had HF.  Admitted 5/22 with a/c systolic heart failure and acute RLL segmental PE and large rt pleural effusion requiring thoracentesis. LE doppler negative for PE. Repeat echo showed EF 10-15% w/ RV dilated  and severely HK. R/LHC 5/27 with minimal CAD and severe NICM, mildly elevated filling pressures and relatively preserved CO, 5.4, CI 2.5. cMRI was recommended but unable to get completed inpatient. D/c wt 211 lb.   Echo 10/22 EF ~25% w/ global HK. RV mildly enlarged and mildly reduced. Moderate MR noted w/ restricted posterior leaflet motion. Mild TR.   Echo 04/15/23 showed EF 10-15%, mild LVH, RV moderately down, RVSP 69.2 mmHg, mild to moderate MR  04/2023 -HgbA1C >15.5.   He was seen in 6/24 and off all his HF meds. Meds restarted.   Echo 7/24 EF < 20%, RV severely reduced, small LV thrombus.  Today he returns for HF follow up. Overall feeling fine. He continues to coach girls' volleyball, he is not SOB with activity. Works ~ 65 hours/week. Denies increasing SOB, palpitations, abnormal bleeding, CP, dizziness, edema, or PND/Orthopnea. Appetite ok. Weight at home 190 pounds. Taking all medications. Now on insulin .   Cardiac Studies - Echo 7/24 EF < 20%, RV severely reduced, small LV  thrombus  - Echo (5/24): EF 10-15%, mild LVH, RV moderately down, RVSP 69.2 mmHg, mild to moderate MR  - Echo (10/22): EF 25%, RV mildly reduced, Mod MR w/ restricted posterior leaflet motion.    - Echo (5/22): EF 10-15%, RV severely reduced, Mod MR, Mod-severe TR  - R/LHC (5/22) RPDA lesion is 50% stenosed. 1st Diag lesion is 20% stenosed. Dist LAD lesion is 20% stenosed.   Ao = 108/71 (88) LV = 112/22 RA =  10 RV = 59/14 PA = 57/22 (37) PCW = 14 Fick cardiac output/index = 5.4/2.5 PVR = 4.3 WU SVR = 1166 Ao sat = 99% PA sat = 71%, 72%   1. Minimal CAD 2. Severe NICM  3. Mild PAH 4. Mildly elevated filling pressure with relatively preserved CO   ROS: All systems reviewed and negative except as per HPI.   Past Medical History:  Diagnosis Date   CHF (congestive heart failure) (HCC)    Chronic congestive heart failure (HCC) 08/27/2019   Renal disorder    kidney stones   Current Outpatient Medications  Medication Sig Dispense Refill   apixaban  (ELIQUIS ) 5 MG TABS tablet Take 1 tablet (5 mg total) by mouth 2 (two) times daily. 60 tablet 11   Blood Glucose Monitoring Suppl (ACCU-CHEK GUIDE) w/Device KIT Use as directed. 1 kit 0   Blood Glucose Monitoring Suppl (TRUE METRIX METER) w/Device KIT Use as  instructed. Check blood glucose level by fingerstick three times per day. E11.65 1 kit 0   carvedilol  (COREG ) 3.125 MG tablet Take 1 tablet (3.125 mg total) by mouth 2 (two) times daily. 180 tablet 3   empagliflozin  (JARDIANCE ) 25 MG TABS tablet Take 1 tablet (25 mg total) by mouth daily. 90 tablet 3   furosemide  (LASIX ) 40 MG tablet TAKE ONE TABLET BY MOUTH TWO TIMES DAILY 180 tablet 3   glucose blood (TRUE METRIX BLOOD GLUCOSE TEST) test strip Use as instructed. Check blood glucose level by fingerstick three times per day. E11.65 200 each 6   insulin  glargine (LANTUS ) 100 UNIT/ML Solostar Pen Inject 20 Units into the skin daily at 10 pm. 15 mL 11   losartan  (COZAAR ) 25 MG  tablet Take 25 mg by mouth daily.     rosuvastatin  (CRESTOR ) 20 MG tablet Take 1 tablet (20 mg total) by mouth daily. 90 tablet 3   spironolactone  (ALDACTONE ) 25 MG tablet Take 1/2 tablet (12.5 mg total) by mouth daily. 45 tablet 3   TRUEplus Lancets 28G MISC Use as instructed. Check blood glucose level by fingerstick three times per day. E11.65 200 each 3   No current facility-administered medications for this encounter.   No Known Allergies  Social History   Socioeconomic History   Marital status: Single    Spouse name: Not on file   Number of children: Not on file   Years of education: Not on file   Highest education level: Not on file  Occupational History   Not on file  Tobacco Use   Smoking status: Never   Smokeless tobacco: Never  Vaping Use   Vaping status: Never Used  Substance and Sexual Activity   Alcohol use: Not Currently   Drug use: Never   Sexual activity: Not on file  Other Topics Concern   Not on file  Social History Narrative   Not on file   Social Drivers of Health   Financial Resource Strain: Low Risk  (10/07/2023)   Overall Financial Resource Strain (CARDIA)    Difficulty of Paying Living Expenses: Not hard at all  Food Insecurity: No Food Insecurity (10/07/2023)   Hunger Vital Sign    Worried About Running Out of Food in the Last Year: Never true    Ran Out of Food in the Last Year: Never true  Transportation Needs: No Transportation Needs (10/07/2023)   PRAPARE - Administrator, Civil Service (Medical): No    Lack of Transportation (Non-Medical): No  Physical Activity: Sufficiently Active (10/07/2023)   Exercise Vital Sign    Days of Exercise per Week: 5 days    Minutes of Exercise per Session: 60 min  Recent Concern: Physical Activity - Inactive (10/07/2023)   Exercise Vital Sign    Days of Exercise per Week: 0 days    Minutes of Exercise per Session: 0 min  Stress: No Stress Concern Present (10/07/2023)   [employer] of  Occupational Health - Occupational Stress Questionnaire    Feeling of Stress : Not at all  Social Connections: Unknown (10/07/2023)   Social Connection and Isolation Panel    Frequency of Communication with Friends and Family: More than three times a week    Frequency of Social Gatherings with Friends and Family: More than three times a week    Attends Religious Services: More than 4 times per year    Active Member of [employer] or Organizations: No    Attends Banker  Meetings: More than 4 times per year    Marital Status: Patient declined  Intimate Partner Violence: Not At Risk (10/07/2023)   Humiliation, Afraid, Rape, and Kick questionnaire    Fear of Current or Ex-Partner: No    Emotionally Abused: No    Physically Abused: No    Sexually Abused: No   No family history on file.  BP 122/86   Pulse 92   Ht 5' 10 (1.778 m)   Wt 87.2 kg (192 lb 3.2 oz)   SpO2 98%   BMI 27.58 kg/m   Wt Readings from Last 3 Encounters:  08/24/24 87.2 kg (192 lb 3.2 oz)  05/11/24 91 kg (200 lb 9.6 oz)  03/16/24 89.9 kg (198 lb 3.2 oz)   PHYSICAL EXAM: General:  NAD. No resp difficulty, walked into clinic HEENT: Normal Neck: Supple. No JVD. Cor: Regular rate & rhythm. No rubs, gallops or murmurs. Lungs: Clear Abdomen: Soft, nontender, nondistended.  Extremities: No cyanosis, clubbing, rash, edema Neuro: Alert & oriented x 3, moves all 4 extremities w/o difficulty. Affect pleasant.  ReDs reading: 36%, abnormal  ASSESSMENT & PLAN:  1. Chronic Biventricular Heart Failure  - Due to NICM. Unclear etiology.  - Echo (5/22): EF 15% RV severely HK - cath (5/22): with minimal CAD. Severe NICM. Hemodynamics much better than expected. Fick 5.4/2.5 - has not had cMRI. Uninsured.  - Echo (10/22): EF ~25%, RV mildly reduced.  - Echo 04/15/23 showed EF 10-15%, mild LVH, RV moderately down, RSVP 69.2 mmHg, mild to moderate MR - Echo 7/24: <20%, RV severely reduced, LV thrombus - NYHA I. Volume  stable. ReDs 36% - Increase spiro to 25 mg daily - Continue Lasix  40 mg bid. - Continue losartan  25 mg daily (no patient assistance for Entresto ). - Continue Jardiance  25 mg daily.  - Continue Coreg  3.125 mg bid. - Doing well despite severely depressed EF. Unfortunately not a candidate for advanced therapies due to uncontrolled DM2, compliance issues and lack of insurance so will not consider CPX at this time.  -  Labs today, repeat BMET at PCP visit on 09/11/24 (he is unable to get back for labs in a week)  2. HTN - BP stable - Meds as above  3. LV thrombus - On Elqiuis. No bleeding issues. - CBC today   4. H/o RLL Subsegmental PE - Diagnosed 5/22, LE Dopplers negative for DVT  - On Elqiuis   5. 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  6. CAD - mild by cath (5/22). - No chest pain - Last LDL 118, increase Crestor  to 40 mg daily - Check LFTs and lipids in 6-8 weeks - LDL goal < 55. Consider referral to Lipid clinic  7. Type 2 DM - uncontrolled, last A1C 15 - On SGLT2i. No GU symptoms. - He is now on insulin   8. SDOH - uninsured. HFSW has seen him but he still hasn't applied for Medicaid as instructed - discrepancies with med dispense report, unclear if he is taking meds consistently. - Meds thru HF fund.  Follow up in 3 months with Dr. Bensimhon  Latorsha Curling M Arend Bahl, FNP 08/24/24

## 2024-08-24 ENCOUNTER — Ambulatory Visit (HOSPITAL_COMMUNITY): Payer: Self-pay | Admitting: Family Medicine

## 2024-08-24 ENCOUNTER — Ambulatory Visit (HOSPITAL_COMMUNITY)
Admission: RE | Admit: 2024-08-24 | Discharge: 2024-08-24 | Disposition: A | Discharge status: Home / Self Care | Payer: Self-pay | Source: Ambulatory Visit | Attending: Family Medicine | Admitting: Family Medicine

## 2024-08-24 ENCOUNTER — Other Ambulatory Visit (HOSPITAL_COMMUNITY): Payer: Self-pay

## 2024-08-24 ENCOUNTER — Encounter (HOSPITAL_COMMUNITY): Payer: Self-pay

## 2024-08-24 ENCOUNTER — Other Ambulatory Visit: Payer: Self-pay

## 2024-08-24 VITALS — BP 122/86 | HR 92 | Ht 70.0 in | Wt 192.2 lb

## 2024-08-24 DIAGNOSIS — E1165 Type 2 diabetes mellitus with hyperglycemia: Secondary | ICD-10-CM | POA: Insufficient documentation

## 2024-08-24 DIAGNOSIS — Z7901 Long term (current) use of anticoagulants: Secondary | ICD-10-CM | POA: Insufficient documentation

## 2024-08-24 DIAGNOSIS — I5082 Biventricular heart failure: Secondary | ICD-10-CM | POA: Insufficient documentation

## 2024-08-24 DIAGNOSIS — I513 Intracardiac thrombosis, not elsewhere classified: Secondary | ICD-10-CM

## 2024-08-24 DIAGNOSIS — Z91199 Patient's noncompliance with other medical treatment and regimen due to unspecified reason: Secondary | ICD-10-CM | POA: Insufficient documentation

## 2024-08-24 DIAGNOSIS — Z86711 Personal history of pulmonary embolism: Secondary | ICD-10-CM | POA: Insufficient documentation

## 2024-08-24 DIAGNOSIS — I10 Essential (primary) hypertension: Secondary | ICD-10-CM

## 2024-08-24 DIAGNOSIS — I5022 Chronic systolic (congestive) heart failure: Secondary | ICD-10-CM | POA: Insufficient documentation

## 2024-08-24 DIAGNOSIS — E1122 Type 2 diabetes mellitus with diabetic chronic kidney disease: Secondary | ICD-10-CM | POA: Insufficient documentation

## 2024-08-24 DIAGNOSIS — I130 Hypertensive heart and chronic kidney disease with heart failure and stage 1 through stage 4 chronic kidney disease, or unspecified chronic kidney disease: Secondary | ICD-10-CM | POA: Insufficient documentation

## 2024-08-24 DIAGNOSIS — Z794 Long term (current) use of insulin: Secondary | ICD-10-CM | POA: Insufficient documentation

## 2024-08-24 DIAGNOSIS — Z5971 Insufficient health insurance coverage: Secondary | ICD-10-CM | POA: Insufficient documentation

## 2024-08-24 DIAGNOSIS — Z86718 Personal history of other venous thrombosis and embolism: Secondary | ICD-10-CM | POA: Insufficient documentation

## 2024-08-24 DIAGNOSIS — Z79899 Other long term (current) drug therapy: Secondary | ICD-10-CM | POA: Insufficient documentation

## 2024-08-24 DIAGNOSIS — Z7984 Long term (current) use of oral hypoglycemic drugs: Secondary | ICD-10-CM | POA: Insufficient documentation

## 2024-08-24 DIAGNOSIS — I2510 Atherosclerotic heart disease of native coronary artery without angina pectoris: Secondary | ICD-10-CM | POA: Insufficient documentation

## 2024-08-24 DIAGNOSIS — Z139 Encounter for screening, unspecified: Secondary | ICD-10-CM

## 2024-08-24 DIAGNOSIS — N1832 Chronic kidney disease, stage 3b: Secondary | ICD-10-CM | POA: Insufficient documentation

## 2024-08-24 DIAGNOSIS — I428 Other cardiomyopathies: Secondary | ICD-10-CM | POA: Insufficient documentation

## 2024-08-24 LAB — COMPREHENSIVE METABOLIC PANEL WITH GFR
ALT: 22 U/L (ref 0–44)
AST: 19 U/L (ref 15–41)
Albumin: 4 g/dL (ref 3.5–5.0)
Alkaline Phosphatase: 107 U/L (ref 38–126)
Anion gap: 13 (ref 5–15)
BUN: 30 mg/dL — ABNORMAL HIGH (ref 8–23)
CO2: 27 mmol/L (ref 22–32)
Calcium: 9.8 mg/dL (ref 8.9–10.3)
Chloride: 96 mmol/L — ABNORMAL LOW (ref 98–111)
Creatinine, Ser: 1.78 mg/dL — ABNORMAL HIGH (ref 0.61–1.24)
GFR, Estimated: 43 mL/min — ABNORMAL LOW (ref >60)
Glucose, Bld: 421 mg/dL — ABNORMAL HIGH (ref 70–99)
Potassium: 3.8 mmol/L (ref 3.5–5.1)
Sodium: 136 mmol/L (ref 135–145)
Total Bilirubin: 1.2 mg/dL (ref 0.0–1.2)
Total Protein: 8.4 g/dL — ABNORMAL HIGH (ref 6.5–8.1)

## 2024-08-24 LAB — CBC
HCT: 50.1 % (ref 39.0–52.0)
Hemoglobin: 17.4 g/dL — ABNORMAL HIGH (ref 13.0–17.0)
MCH: 30.2 pg (ref 26.0–34.0)
MCHC: 34.7 g/dL (ref 30.0–36.0)
MCV: 87 fL (ref 80.0–100.0)
Platelets: 191 K/uL (ref 150–400)
RBC: 5.76 MIL/uL (ref 4.22–5.81)
RDW: 13.4 % (ref 11.5–15.5)
WBC: 9.6 K/uL (ref 4.0–10.5)
nRBC: 0 % (ref 0.0–0.2)

## 2024-08-24 MED ORDER — FUROSEMIDE 40 MG PO TABS
40.0000 mg | ORAL_TABLET | Freq: Two times a day (BID) | ORAL | 3 refills | Status: AC
  Filled 2024-08-24: qty 60, 30d supply, fill #0
  Filled 2024-10-05 (×2): qty 60, 30d supply, fill #1
  Filled 2024-12-28: qty 60, 30d supply, fill #2

## 2024-08-24 MED ORDER — LOSARTAN POTASSIUM 25 MG PO TABS
25.0000 mg | ORAL_TABLET | Freq: Every day | ORAL | 3 refills | Status: AC
  Filled 2024-08-24: qty 30, 30d supply, fill #0
  Filled 2024-10-05 (×2): qty 30, 30d supply, fill #1
  Filled 2024-11-02: qty 30, 30d supply, fill #2
  Filled 2024-12-28: qty 30, 30d supply, fill #3

## 2024-08-24 MED ORDER — APIXABAN 5 MG PO TABS
5.0000 mg | ORAL_TABLET | Freq: Two times a day (BID) | ORAL | 11 refills | Status: AC
  Filled 2024-08-24: qty 60, 30d supply, fill #0

## 2024-08-24 MED ORDER — CARVEDILOL 3.125 MG PO TABS
3.1250 mg | ORAL_TABLET | Freq: Two times a day (BID) | ORAL | 3 refills | Status: AC
  Filled 2024-08-24: qty 60, 30d supply, fill #0
  Filled 2024-10-05 (×2): qty 60, 30d supply, fill #1
  Filled 2024-11-02: qty 60, 30d supply, fill #2
  Filled 2024-12-28: qty 60, 30d supply, fill #3

## 2024-08-24 MED ORDER — ROSUVASTATIN CALCIUM 40 MG PO TABS
40.0000 mg | ORAL_TABLET | Freq: Every day | ORAL | 3 refills | Status: AC
  Filled 2024-08-24: qty 30, 30d supply, fill #0
  Filled 2024-10-05 (×2): qty 30, 30d supply, fill #1
  Filled 2024-11-02: qty 30, 30d supply, fill #2
  Filled 2024-12-28: qty 30, 30d supply, fill #3

## 2024-08-24 MED ORDER — SPIRONOLACTONE 25 MG PO TABS
25.0000 mg | ORAL_TABLET | Freq: Every day | ORAL | 3 refills | Status: AC
  Filled 2024-08-24: qty 30, 30d supply, fill #0
  Filled 2024-11-02: qty 30, 30d supply, fill #1
  Filled 2024-12-28: qty 30, 30d supply, fill #2

## 2024-08-24 NOTE — Patient Instructions (Signed)
 Medication Changes:  INCREASE ROSUVASTATIN  TO 40MG  ONCE DAILY   INCREASE SPIRONOLACTONE  TO 25MG  ONCE DAILY   Lab Work:  Labs done today, your results will be available in MyChart, we will contact you for abnormal readings.  THEN PLEASE HAVE YOUR PRIMARY CARE DRAW A BASIC METABOLIC PANEL ON THE 30TH WHEN YOU SEE HER   THEN LABS AGAIN IN 6-8 WEEKS AS SCHEDULED   Follow-Up in: 3 MONTHS AS SCHEDULED WITH DR. CHERRIE   At the [facility], you and your health needs are our priority. We have a designated team specialized in the treatment of Heart Failure. This Care Team includes your primary Heart Failure Specialized Cardiologist (physician), Advanced Practice Providers (APPs- Physician Assistants and Nurse Practitioners), and Pharmacist who all work together to provide you with the care you need, when you need it.   You may see any of the following providers on your designated Care Team at your next follow up:  Dr. Toribio CHERRIE Dr. Ezra Shuck Dr. Ria Commander Dr. Odis Brownie Greig Mosses, NP Caffie Shed, [place] [facility] [place], [place] Beckey Coe, NP [place] Lee, NP Tinnie Redman, PharmD   Please be sure to bring in all your medications bottles to every appointment.   Need to Contact Us :  If you have any questions or concerns before your next appointment please send us  a message through [place] or call our office at [phone].    TO LEAVE A MESSAGE FOR THE NURSE SELECT OPTION 2, PLEASE LEAVE A MESSAGE INCLUDING: YOUR NAME DATE OF BIRTH CALL BACK NUMBER REASON FOR CALL**this is important as we prioritize the call backs  YOU WILL RECEIVE A CALL BACK THE SAME DAY AS LONG AS YOU CALL BEFORE 4:00 PM

## 2024-08-24 NOTE — Progress Notes (Signed)
 ReDS Vest / Clip - 08/24/24 0900       ReDS Vest / Clip   Station Marker D    Ruler Value 34    ReDS Value Range Moderate volume overload    ReDS Actual Value 36

## 2024-08-27 ENCOUNTER — Other Ambulatory Visit: Payer: Self-pay

## 2024-09-11 ENCOUNTER — Ambulatory Visit: Payer: Self-pay | Admitting: Nurse Practitioner

## 2024-09-28 ENCOUNTER — Other Ambulatory Visit: Payer: Self-pay

## 2024-10-05 ENCOUNTER — Other Ambulatory Visit (HOSPITAL_COMMUNITY): Payer: Self-pay

## 2024-10-05 ENCOUNTER — Other Ambulatory Visit: Payer: Self-pay

## 2024-10-09 ENCOUNTER — Other Ambulatory Visit: Payer: Self-pay

## 2024-11-02 ENCOUNTER — Other Ambulatory Visit (HOSPITAL_COMMUNITY): Payer: Self-pay

## 2024-11-02 ENCOUNTER — Ambulatory Visit (HOSPITAL_COMMUNITY)
Admission: RE | Admit: 2024-11-02 | Discharge: 2024-11-02 | Disposition: A | Discharge status: Home / Self Care | Payer: Self-pay | Source: Ambulatory Visit | Attending: Cardiology | Admitting: Cardiology

## 2024-11-02 DIAGNOSIS — I10 Essential (primary) hypertension: Secondary | ICD-10-CM | POA: Insufficient documentation

## 2024-11-02 LAB — LIPID PANEL
Cholesterol: 155 mg/dL (ref 0–200)
HDL: 39 mg/dL — ABNORMAL LOW (ref >40)
LDL Cholesterol: 75 mg/dL (ref 0–99)
Total CHOL/HDL Ratio: 4 ratio
Triglycerides: 206 mg/dL — ABNORMAL HIGH (ref <150)
VLDL: 41 mg/dL — ABNORMAL HIGH (ref 0–40)

## 2024-11-02 LAB — HEPATIC FUNCTION PANEL
ALT: 25 U/L (ref 0–44)
AST: 15 U/L (ref 15–41)
Albumin: 3.6 g/dL (ref 3.5–5.0)
Alkaline Phosphatase: 109 U/L (ref 38–126)
Bilirubin, Direct: 0.1 mg/dL (ref 0.0–0.2)
Indirect Bilirubin: 0.6 mg/dL (ref 0.3–0.9)
Total Bilirubin: 0.7 mg/dL (ref 0.0–1.2)
Total Protein: 7.8 g/dL (ref 6.5–8.1)

## 2024-11-16 ENCOUNTER — Other Ambulatory Visit: Payer: Self-pay

## 2024-11-23 ENCOUNTER — Other Ambulatory Visit (HOSPITAL_COMMUNITY): Payer: Self-pay

## 2024-11-23 ENCOUNTER — Other Ambulatory Visit: Payer: Self-pay

## 2024-11-23 ENCOUNTER — Encounter (HOSPITAL_COMMUNITY): Payer: Self-pay | Admitting: Internal Medicine

## 2024-11-23 ENCOUNTER — Ambulatory Visit (HOSPITAL_COMMUNITY)
Admission: RE | Admit: 2024-11-23 | Discharge: 2024-11-23 | Disposition: A | Payer: Self-pay | Source: Ambulatory Visit | Attending: Internal Medicine | Admitting: Internal Medicine

## 2024-11-23 VITALS — BP 138/80 | HR 92 | Wt 198.4 lb

## 2024-11-23 DIAGNOSIS — I10 Essential (primary) hypertension: Secondary | ICD-10-CM

## 2024-11-23 DIAGNOSIS — N1832 Chronic kidney disease, stage 3b: Secondary | ICD-10-CM

## 2024-11-23 DIAGNOSIS — I5022 Chronic systolic (congestive) heart failure: Secondary | ICD-10-CM

## 2024-11-23 LAB — BASIC METABOLIC PANEL WITH GFR
Anion gap: 10 (ref 5–15)
BUN: 38 mg/dL — ABNORMAL HIGH (ref 8–23)
CO2: 31 mmol/L (ref 22–32)
Calcium: 9.1 mg/dL (ref 8.9–10.3)
Chloride: 92 mmol/L — ABNORMAL LOW (ref 98–111)
Creatinine, Ser: 1.72 mg/dL — ABNORMAL HIGH (ref 0.61–1.24)
GFR, Estimated: 44 mL/min — ABNORMAL LOW (ref >60)
Glucose, Bld: 623 mg/dL (ref 70–99)
Potassium: 3.9 mmol/L (ref 3.5–5.1)
Sodium: 133 mmol/L — ABNORMAL LOW (ref 135–145)

## 2024-11-23 LAB — BRAIN NATRIURETIC PEPTIDE: B Natriuretic Peptide: 95.2 pg/mL (ref 0.0–100.0)

## 2024-11-23 NOTE — Progress Notes (Signed)
 [facility] Note    PCP: Theotis Haze ORN, NP HF Cardiologist: Dr. Cherrie   Chief complaint: HF  HPI: [name] is a 63 y.o. male with a h/o HTN, CKD 3b (Scr 1.8), DVT, and systolic CHF.    Found to have DVT in 2018. He then developed fluid overload and had echo at OSH in 2019 and was told heart was operating at 20%. Underwent diuresis and felt better.  Did not have cath or repeat u/s. Says he got scared and decided not to f/u.    Seen 8/20 in HF Clinic for one visit. Bedside echo showed EF 20% and RV moderately HK. Reported only NYHA II symptoms, but was markedly tachycardic. Advised on need for R/L cath but he never followed up. DVT felt to be likely provoked in setting of immobility when he first had HF.  Admitted 5/22 with a/c systolic heart failure and acute RLL segmental PE and large rt pleural effusion requiring thoracentesis. LE doppler negative for PE. Repeat echo showed EF 10-15% w/ RV dilated  and severely HK. R/LHC 5/27 with minimal CAD and severe NICM, mildly elevated filling pressures and relatively preserved CO, 5.4, CI 2.5. cMRI was recommended but unable to get completed inpatient. D/c wt 211 lb.   Echo 10/22 EF ~25% w/ global HK. RV mildly enlarged and mildly reduced. Moderate MR noted w/ restricted posterior leaflet motion. Mild TR.   Echo 04/15/23 showed EF 10-15%, mild LVH, RV moderately down, RVSP 69.2 mmHg, mild to moderate MR  04/2023 -HgbA1C >15.5.   He was seen in 6/24 and off all his HF meds. Meds restarted.   Echo 7/24 EF < 20%, RV severely reduced, small LV thrombus  Today he returns for HF follow up. 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 Says he feels great. No CP or SOB. No edema, orthopnea or PND. Reports compliance with meds. A1c 5/25 > 15%    Cardiac Studies - Echo 7/24 EF < 20%, RV severely reduced, small LV thrombus - Echo (5/24): EF 10-15%, mild LVH, RV moderately down, RVSP 69.2 mmHg, mild to moderate MR - Echo  (5/22): EF 10-15%, RV severely reduced, Mod MR, Mod-severe TR  - R/LHC (5/22) RPDA lesion is 50% stenosed. 1st Diag lesion is 20% stenosed. Dist LAD lesion is 20% stenosed.   Ao = 108/71 (88) LV = 112/22 RA =  10 RV = 59/14 PA = 57/22 (37) PCW = 14 Fick cardiac output/index = 5.4/2.5 PVR = 4.3 WU SVR = 1166 Ao sat = 99% PA sat = 71%, 72%   1. Minimal CAD 2. Severe NICM  3. Mild PAH 4. Mildly elevated filling pressure with relatively preserved CO   ROS: All systems reviewed and negative except as per HPI.   Past Medical History:  Diagnosis Date   CHF (congestive heart failure) (HCC)    Chronic congestive heart failure (HCC) 08/27/2019   Renal disorder    kidney stones   Current Outpatient Medications  Medication Sig Dispense Refill   apixaban  (ELIQUIS ) 5 MG TABS tablet Take 1 tablet (5 mg total) by mouth 2 (two) times daily. 60 tablet 11   Blood Glucose Monitoring Suppl (ACCU-CHEK GUIDE) w/Device KIT Use as directed. 1 kit 0   Blood Glucose Monitoring Suppl (TRUE METRIX METER) w/Device KIT Use as instructed. Check blood glucose level by fingerstick three times per day. E11.65 1 kit 0   carvedilol  (COREG ) 3.125 MG tablet Take 1 tablet (3.125 mg total) by mouth  2 (two) times daily. 180 tablet 3   empagliflozin  (JARDIANCE ) 25 MG TABS tablet Take 1 tablet (25 mg total) by mouth daily. 90 tablet 3   furosemide  (LASIX ) 40 MG tablet Take 1 tablet (40 mg total) by mouth 2 (two) times daily. 180 tablet 3   glucose blood (TRUE METRIX BLOOD GLUCOSE TEST) test strip Use as instructed. Check blood glucose level by fingerstick three times per day. E11.65 200 each 6   insulin  glargine (LANTUS ) 100 UNIT/ML Solostar Pen Inject 20 Units into the skin daily at 10 pm. 15 mL 11   losartan  (COZAAR ) 25 MG tablet Take 1 tablet (25 mg total) by mouth daily. 90 tablet 3   rosuvastatin  (CRESTOR ) 40 MG tablet Take 1 tablet (40 mg total) by mouth daily. 90 tablet 3   spironolactone  (ALDACTONE ) 25 MG  tablet Take 1 tablet (25 mg total) by mouth daily. 90 tablet 3   TRUEplus Lancets 28G MISC Use as instructed. Check blood glucose level by fingerstick three times per day. E11.65 200 each 3   No current facility-administered medications for this encounter.   No Known Allergies  Social History   Socioeconomic History   Marital status: Single    Spouse name: Not on file   Number of children: Not on file   Years of education: Not on file   Highest education level: Not on file  Occupational History   Not on file  Tobacco Use   Smoking status: Never   Smokeless tobacco: Never  Vaping Use   Vaping status: Never Used  Substance and Sexual Activity   Alcohol use: Not Currently   Drug use: Never   Sexual activity: Not on file  Other Topics Concern   Not on file  Social History Narrative   Not on file   Social Drivers of Health   Tobacco Use: Low Risk (11/23/2024)   Patient History    Smoking Tobacco Use: Never    Smokeless Tobacco Use: Never    Passive Exposure: Not on file  Financial Resource Strain: Low Risk (10/07/2023)   Overall Financial Resource Strain (CARDIA)    Difficulty of Paying Living Expenses: Not hard at all  Food Insecurity: No Food Insecurity (10/07/2023)   Hunger Vital Sign    Worried About Running Out of Food in the Last Year: Never true    Ran Out of Food in the Last Year: Never true  Transportation Needs: No Transportation Needs (10/07/2023)   PRAPARE - Administrator, Civil Service (Medical): No    Lack of Transportation (Non-Medical): No  Physical Activity: Sufficiently Active (10/07/2023)   Exercise Vital Sign    Days of Exercise per Week: 5 days    Minutes of Exercise per Session: 60 min  Recent Concern: Physical Activity - Inactive (10/07/2023)   Exercise Vital Sign    Days of Exercise per Week: 0 days    Minutes of Exercise per Session: 0 min  Stress: No Stress Concern Present (10/07/2023)   [employer] of Occupational  Health - Occupational Stress Questionnaire    Feeling of Stress : Not at all  Social Connections: Unknown (10/07/2023)   Social Connection and Isolation Panel    Frequency of Communication with Friends and Family: More than three times a week    Frequency of Social Gatherings with Friends and Family: More than three times a week    Attends Religious Services: More than 4 times per year    Active Member of Clubs or  Organizations: No    Attends Engineer, Structural: More than 4 times per year    Marital Status: Patient declined  Intimate Partner Violence: Not At Risk (10/07/2023)   Humiliation, Afraid, Rape, and Kick questionnaire    Fear of Current or Ex-Partner: No    Emotionally Abused: No    Physically Abused: No    Sexually Abused: No  Depression (PHQ2-9): Low Risk (05/11/2024)   Depression (PHQ2-9)    PHQ-2 Score: 0  Alcohol Screen: Low Risk (10/07/2023)   Alcohol Screen    Last Alcohol Screening Score (AUDIT): 0  Housing: Low Risk (10/07/2023)   Housing    Last Housing Risk Score: 0  Utilities: Not At Risk (10/07/2023)   AHC Utilities    Threatened with loss of utilities: No  Health Literacy: Adequate Health Literacy (10/07/2023)   B1300 Health Literacy    Frequency of need for help with medical instructions: Never   History reviewed. No pertinent family history.  BP 138/80   Pulse 92   Wt 90 kg (198 lb 6.4 oz)   SpO2 98%   BMI 28.47 kg/m   Wt Readings from Last 3 Encounters:  11/23/24 90 kg (198 lb 6.4 oz)  08/24/24 87.2 kg (192 lb 3.2 oz)  05/11/24 91 kg (200 lb 9.6 oz)   PHYSICAL EXAM: General:  Sitting up. No resp difficulty HEENT: normal Neck: supple. no JVD.  Cor: Regular rate & rhythm. No rubs, gallops or murmurs. Lungs: clear Abdomen: soft, nontender, nondistended.Good bowel sounds. Extremities: no cyanosis, clubbing, rash, edema Neuro: alert & orientedx3, cranial nerves grossly intact. moves all 4 extremities w/o difficulty. Affect  pleasant    ASSESSMENT & PLAN:  1. Chronic Biventricular Heart Failure  - Due to NICM. Unclear etiology.  - Echo (5/22): EF 15% RV severely HK - cath (5/22): with minimal CAD. Severe NICM. Hemodynamics much better than expected. Fick 5.4/2.5 - has not had cMRI. Uninsured.  - Echo (10/22): EF ~25%, RV mildly reduced.  - Echo 04/15/23 showed EF 10-15%, mild LVH, RV moderately down, RSVP 69.2 mmHg, mild to moderate MR - Echo 7/24: <20%, RV severely reduced, LV thrombus - NYHA I. Volume status ok - Continue spiro 12.5 mg daily. - Continue Lasix  40 mg bid. - Continue losartan  25 mg daily (no patient assistance for Entresto ). - Continue  Jardiance  10 mg daily.  - Continue Coreg  3.125 mg bid. - Will keep GDMT at current level - Doing well despite severely depressed EF. Unfortunately not a candidate for advanced therapies due to uncontrolled DM2, compliance issues and lack of insurance so will not consider CPX or genetic testing at this time.  - RTC in 6 months with echo   2. Hypertension  - BP well controlled at home - SBP 120s   3. LV thrombus - On Elqiuis. No bleeding issues. - CBC today   4. H/o RLL Subsegmental PE - Diagnosed 5/22, LE Dopplers negative for DVT  - On Elqiuis   5. 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 Will referback  - check labs today  6. CAD - mild by cath (5/22). - No s/s angina - On Crestor   7. Type 2 DM - Hgb A1c  5/25 > 15% - On SGLT2i. No GU symptoms. - He is starting insulin   8. SDOH - remains uninsured   Toribio Fuel, MD 11/23/2024

## 2024-11-23 NOTE — Patient Instructions (Signed)
 Medication Changes:  None, continue current medications  Lab Work:  Labs done today, your results will be available in MyChart, we will contact you for abnormal readings.  Referrals:  You have been referred to [facility], they will call you for an appointment  Special Instructions // Education:  Do the following things EVERYDAY: Weigh yourself in the morning before breakfast. Write it down and keep it in a log. Take your medicines as prescribed Eat low salt foods--Limit salt (sodium) to 2000 mg per day.  Stay as active as you can everyday Limit all fluids for the day to less than 2 liters   Follow-Up in: 6 months with an echocardiogram (June 2026), **PLEASE CALL OUR OFFICE IN [place] TO SCHEDULE THIS APPOINTMENT   At the [facility], you and your health needs are our priority. We have a designated team specialized in the treatment of Heart Failure. This Care Team includes your primary Heart Failure Specialized Cardiologist (physician), Advanced Practice Providers (APPs- Physician Assistants and Nurse Practitioners), and Pharmacist who all work together to provide you with the care you need, when you need it.   You may see any of the following providers on your designated Care Team at your next follow up:  Dr. Toribio Fuel Dr. Ezra Shuck Dr. Odis Brownie Greig Mosses, NP Caffie Shed, [place] [facility] [place], [place] Beckey Coe, NP Jordan Lee, NP Tinnie Redman, PharmD   Please be sure to bring in all your medications bottles to every appointment.   Need to Contact Us :  If you have any questions or concerns before your next appointment please send us  a message through [place] or call our office at [phone].    TO LEAVE A MESSAGE FOR THE NURSE SELECT OPTION 2, PLEASE LEAVE A MESSAGE INCLUDING: YOUR NAME DATE OF BIRTH CALL BACK NUMBER REASON FOR CALL**this is important as we prioritize the call backs  YOU WILL RECEIVE A CALL BACK  THE SAME DAY AS LONG AS YOU CALL BEFORE 4:00 PM

## 2024-11-23 NOTE — Addendum Note (Signed)
 Encounter addended by: Buell Powell HERO, RN on: 11/23/2024 10:28 AM  Actions taken: Clinical Note Signed, Order list changed, Diagnosis association updated, Charge Capture section accepted

## 2024-11-26 ENCOUNTER — Ambulatory Visit (HOSPITAL_COMMUNITY): Payer: Self-pay | Admitting: Internal Medicine

## 2024-11-29 NOTE — Progress Notes (Signed)
 Referral and records faxed to [facility] at [phone]

## 2024-11-29 NOTE — Addendum Note (Signed)
 Encounter addended by: Buell Powell HERO, RN on: 11/29/2024 11:48 AM  Actions taken: Clinical Note Signed

## 2024-12-18 DIAGNOSIS — E119 Type 2 diabetes mellitus without complications: Secondary | ICD-10-CM

## 2024-12-28 ENCOUNTER — Other Ambulatory Visit (HOSPITAL_COMMUNITY): Payer: Self-pay
